# Patient Record
Sex: Female | Born: 1945
Health system: Southern US, Community
[De-identification: ages and names within clinical notes are randomized; demographics above are authoritative.]

## PROBLEM LIST (undated history)

## (undated) DIAGNOSIS — E039 Hypothyroidism, unspecified: Secondary | ICD-10-CM

## (undated) DIAGNOSIS — Z87442 Personal history of urinary calculi: Secondary | ICD-10-CM

## (undated) DIAGNOSIS — I10 Essential (primary) hypertension: Secondary | ICD-10-CM

## (undated) DIAGNOSIS — T8859XA Other complications of anesthesia, initial encounter: Secondary | ICD-10-CM

## (undated) DIAGNOSIS — G90519 Complex regional pain syndrome I of unspecified upper limb: Secondary | ICD-10-CM

## (undated) DIAGNOSIS — J189 Pneumonia, unspecified organism: Secondary | ICD-10-CM

## (undated) DIAGNOSIS — M1990 Unspecified osteoarthritis, unspecified site: Secondary | ICD-10-CM

## (undated) DIAGNOSIS — C4490 Unspecified malignant neoplasm of skin, unspecified: Secondary | ICD-10-CM

## (undated) DIAGNOSIS — T4145XA Adverse effect of unspecified anesthetic, initial encounter: Secondary | ICD-10-CM

## (undated) HISTORY — PX: APPENDECTOMY: SHX54

## (undated) HISTORY — PX: BREAST SURGERY: SHX581

---

## 2013-11-27 DIAGNOSIS — M5137 Other intervertebral disc degeneration, lumbosacral region: Secondary | ICD-10-CM | POA: Diagnosis not present

## 2013-11-27 DIAGNOSIS — I10 Essential (primary) hypertension: Secondary | ICD-10-CM | POA: Diagnosis not present

## 2013-11-27 DIAGNOSIS — M5430 Sciatica, unspecified side: Secondary | ICD-10-CM | POA: Diagnosis not present

## 2013-11-27 DIAGNOSIS — E039 Hypothyroidism, unspecified: Secondary | ICD-10-CM | POA: Diagnosis not present

## 2013-11-27 DIAGNOSIS — M545 Low back pain, unspecified: Secondary | ICD-10-CM | POA: Diagnosis not present

## 2013-12-26 DIAGNOSIS — M545 Low back pain, unspecified: Secondary | ICD-10-CM | POA: Diagnosis not present

## 2013-12-26 DIAGNOSIS — G90519 Complex regional pain syndrome I of unspecified upper limb: Secondary | ICD-10-CM | POA: Diagnosis not present

## 2013-12-26 DIAGNOSIS — J019 Acute sinusitis, unspecified: Secondary | ICD-10-CM | POA: Diagnosis not present

## 2013-12-26 DIAGNOSIS — I10 Essential (primary) hypertension: Secondary | ICD-10-CM | POA: Diagnosis not present

## 2013-12-26 DIAGNOSIS — M5137 Other intervertebral disc degeneration, lumbosacral region: Secondary | ICD-10-CM | POA: Diagnosis not present

## 2014-01-23 DIAGNOSIS — M5430 Sciatica, unspecified side: Secondary | ICD-10-CM | POA: Diagnosis not present

## 2014-01-23 DIAGNOSIS — M545 Low back pain, unspecified: Secondary | ICD-10-CM | POA: Diagnosis not present

## 2014-01-23 DIAGNOSIS — F4322 Adjustment disorder with anxiety: Secondary | ICD-10-CM | POA: Diagnosis not present

## 2014-01-23 DIAGNOSIS — I10 Essential (primary) hypertension: Secondary | ICD-10-CM | POA: Diagnosis not present

## 2014-01-23 DIAGNOSIS — M5137 Other intervertebral disc degeneration, lumbosacral region: Secondary | ICD-10-CM | POA: Diagnosis not present

## 2014-02-11 DIAGNOSIS — W010XXA Fall on same level from slipping, tripping and stumbling without subsequent striking against object, initial encounter: Secondary | ICD-10-CM | POA: Diagnosis not present

## 2014-02-11 DIAGNOSIS — S0003XA Contusion of scalp, initial encounter: Secondary | ICD-10-CM | POA: Diagnosis not present

## 2014-02-11 DIAGNOSIS — S2249XA Multiple fractures of ribs, unspecified side, initial encounter for closed fracture: Secondary | ICD-10-CM | POA: Diagnosis not present

## 2014-02-11 DIAGNOSIS — T148XXA Other injury of unspecified body region, initial encounter: Secondary | ICD-10-CM | POA: Diagnosis not present

## 2014-02-11 DIAGNOSIS — S0083XA Contusion of other part of head, initial encounter: Secondary | ICD-10-CM | POA: Diagnosis not present

## 2014-02-19 DIAGNOSIS — IMO0001 Reserved for inherently not codable concepts without codable children: Secondary | ICD-10-CM | POA: Diagnosis not present

## 2014-02-19 DIAGNOSIS — S2249XA Multiple fractures of ribs, unspecified side, initial encounter for closed fracture: Secondary | ICD-10-CM | POA: Diagnosis not present

## 2014-02-19 DIAGNOSIS — M549 Dorsalgia, unspecified: Secondary | ICD-10-CM | POA: Diagnosis not present

## 2014-02-19 DIAGNOSIS — W010XXA Fall on same level from slipping, tripping and stumbling without subsequent striking against object, initial encounter: Secondary | ICD-10-CM | POA: Diagnosis not present

## 2014-02-19 DIAGNOSIS — I10 Essential (primary) hypertension: Secondary | ICD-10-CM | POA: Diagnosis not present

## 2014-02-25 DIAGNOSIS — G90519 Complex regional pain syndrome I of unspecified upper limb: Secondary | ICD-10-CM | POA: Diagnosis not present

## 2014-02-25 DIAGNOSIS — M545 Low back pain, unspecified: Secondary | ICD-10-CM | POA: Diagnosis not present

## 2014-02-25 DIAGNOSIS — S2239XA Fracture of one rib, unspecified side, initial encounter for closed fracture: Secondary | ICD-10-CM | POA: Diagnosis not present

## 2014-02-25 DIAGNOSIS — F4322 Adjustment disorder with anxiety: Secondary | ICD-10-CM | POA: Diagnosis not present

## 2014-03-25 DIAGNOSIS — M545 Low back pain, unspecified: Secondary | ICD-10-CM | POA: Diagnosis not present

## 2014-03-25 DIAGNOSIS — G90519 Complex regional pain syndrome I of unspecified upper limb: Secondary | ICD-10-CM | POA: Diagnosis not present

## 2014-03-25 DIAGNOSIS — F4322 Adjustment disorder with anxiety: Secondary | ICD-10-CM | POA: Diagnosis not present

## 2014-03-25 DIAGNOSIS — I10 Essential (primary) hypertension: Secondary | ICD-10-CM | POA: Diagnosis not present

## 2014-03-25 DIAGNOSIS — S2249XA Multiple fractures of ribs, unspecified side, initial encounter for closed fracture: Secondary | ICD-10-CM | POA: Diagnosis not present

## 2014-03-25 DIAGNOSIS — M5137 Other intervertebral disc degeneration, lumbosacral region: Secondary | ICD-10-CM | POA: Diagnosis not present

## 2014-03-25 DIAGNOSIS — M5430 Sciatica, unspecified side: Secondary | ICD-10-CM | POA: Diagnosis not present

## 2014-04-22 DIAGNOSIS — I10 Essential (primary) hypertension: Secondary | ICD-10-CM | POA: Diagnosis not present

## 2014-04-22 DIAGNOSIS — M5137 Other intervertebral disc degeneration, lumbosacral region: Secondary | ICD-10-CM | POA: Diagnosis not present

## 2014-04-22 DIAGNOSIS — M545 Low back pain, unspecified: Secondary | ICD-10-CM | POA: Diagnosis not present

## 2014-04-22 DIAGNOSIS — F4322 Adjustment disorder with anxiety: Secondary | ICD-10-CM | POA: Diagnosis not present

## 2014-04-22 DIAGNOSIS — S2239XA Fracture of one rib, unspecified side, initial encounter for closed fracture: Secondary | ICD-10-CM | POA: Diagnosis not present

## 2014-05-15 DIAGNOSIS — H60399 Other infective otitis externa, unspecified ear: Secondary | ICD-10-CM | POA: Diagnosis not present

## 2014-05-21 DIAGNOSIS — G9050 Complex regional pain syndrome I, unspecified: Secondary | ICD-10-CM | POA: Diagnosis not present

## 2014-05-21 DIAGNOSIS — K219 Gastro-esophageal reflux disease without esophagitis: Secondary | ICD-10-CM | POA: Diagnosis not present

## 2014-05-21 DIAGNOSIS — M545 Low back pain, unspecified: Secondary | ICD-10-CM | POA: Diagnosis not present

## 2014-05-21 DIAGNOSIS — M25559 Pain in unspecified hip: Secondary | ICD-10-CM | POA: Diagnosis not present

## 2014-05-21 DIAGNOSIS — E039 Hypothyroidism, unspecified: Secondary | ICD-10-CM | POA: Diagnosis not present

## 2014-05-21 DIAGNOSIS — M5430 Sciatica, unspecified side: Secondary | ICD-10-CM | POA: Diagnosis not present

## 2014-05-21 DIAGNOSIS — I10 Essential (primary) hypertension: Secondary | ICD-10-CM | POA: Diagnosis not present

## 2014-05-21 DIAGNOSIS — S2239XA Fracture of one rib, unspecified side, initial encounter for closed fracture: Secondary | ICD-10-CM | POA: Diagnosis not present

## 2014-06-23 DIAGNOSIS — S2239XA Fracture of one rib, unspecified side, initial encounter for closed fracture: Secondary | ICD-10-CM | POA: Diagnosis not present

## 2014-06-23 DIAGNOSIS — I10 Essential (primary) hypertension: Secondary | ICD-10-CM | POA: Diagnosis not present

## 2014-06-23 DIAGNOSIS — M5430 Sciatica, unspecified side: Secondary | ICD-10-CM | POA: Diagnosis not present

## 2014-06-23 DIAGNOSIS — G90519 Complex regional pain syndrome I of unspecified upper limb: Secondary | ICD-10-CM | POA: Diagnosis not present

## 2014-06-23 DIAGNOSIS — M545 Low back pain, unspecified: Secondary | ICD-10-CM | POA: Diagnosis not present

## 2014-07-29 DIAGNOSIS — N390 Urinary tract infection, site not specified: Secondary | ICD-10-CM | POA: Diagnosis not present

## 2014-08-18 DIAGNOSIS — M5430 Sciatica, unspecified side: Secondary | ICD-10-CM | POA: Diagnosis not present

## 2014-08-18 DIAGNOSIS — I10 Essential (primary) hypertension: Secondary | ICD-10-CM | POA: Diagnosis not present

## 2014-08-18 DIAGNOSIS — Z79891 Long term (current) use of opiate analgesic: Secondary | ICD-10-CM | POA: Diagnosis not present

## 2014-08-18 DIAGNOSIS — G90519 Complex regional pain syndrome I of unspecified upper limb: Secondary | ICD-10-CM | POA: Diagnosis not present

## 2014-08-18 DIAGNOSIS — M5136 Other intervertebral disc degeneration, lumbar region: Secondary | ICD-10-CM | POA: Diagnosis not present

## 2014-08-18 DIAGNOSIS — F411 Generalized anxiety disorder: Secondary | ICD-10-CM | POA: Diagnosis not present

## 2014-08-18 DIAGNOSIS — D649 Anemia, unspecified: Secondary | ICD-10-CM | POA: Diagnosis not present

## 2014-08-18 DIAGNOSIS — M545 Low back pain: Secondary | ICD-10-CM | POA: Diagnosis not present

## 2014-08-18 DIAGNOSIS — E039 Hypothyroidism, unspecified: Secondary | ICD-10-CM | POA: Diagnosis not present

## 2014-09-17 DIAGNOSIS — E039 Hypothyroidism, unspecified: Secondary | ICD-10-CM | POA: Diagnosis not present

## 2014-09-17 DIAGNOSIS — M5430 Sciatica, unspecified side: Secondary | ICD-10-CM | POA: Diagnosis not present

## 2014-09-17 DIAGNOSIS — Z23 Encounter for immunization: Secondary | ICD-10-CM | POA: Diagnosis not present

## 2014-09-17 DIAGNOSIS — M5136 Other intervertebral disc degeneration, lumbar region: Secondary | ICD-10-CM | POA: Diagnosis not present

## 2014-09-17 DIAGNOSIS — M545 Low back pain: Secondary | ICD-10-CM | POA: Diagnosis not present

## 2014-09-17 DIAGNOSIS — G4700 Insomnia, unspecified: Secondary | ICD-10-CM | POA: Diagnosis not present

## 2014-09-17 DIAGNOSIS — I10 Essential (primary) hypertension: Secondary | ICD-10-CM | POA: Diagnosis not present

## 2014-09-17 DIAGNOSIS — G90511 Complex regional pain syndrome I of right upper limb: Secondary | ICD-10-CM | POA: Diagnosis not present

## 2014-12-24 DIAGNOSIS — E039 Hypothyroidism, unspecified: Secondary | ICD-10-CM | POA: Diagnosis not present

## 2014-12-24 DIAGNOSIS — K219 Gastro-esophageal reflux disease without esophagitis: Secondary | ICD-10-CM | POA: Diagnosis not present

## 2014-12-24 DIAGNOSIS — G8929 Other chronic pain: Secondary | ICD-10-CM | POA: Diagnosis not present

## 2014-12-24 DIAGNOSIS — I10 Essential (primary) hypertension: Secondary | ICD-10-CM | POA: Diagnosis not present

## 2014-12-24 DIAGNOSIS — G4700 Insomnia, unspecified: Secondary | ICD-10-CM | POA: Diagnosis not present

## 2015-01-21 DIAGNOSIS — R413 Other amnesia: Secondary | ICD-10-CM | POA: Diagnosis not present

## 2015-01-21 DIAGNOSIS — Z7289 Other problems related to lifestyle: Secondary | ICD-10-CM | POA: Diagnosis not present

## 2015-01-21 DIAGNOSIS — Z114 Encounter for screening for human immunodeficiency virus [HIV]: Secondary | ICD-10-CM | POA: Diagnosis not present

## 2015-01-21 DIAGNOSIS — Z0000 Encounter for general adult medical examination without abnormal findings: Secondary | ICD-10-CM | POA: Diagnosis not present

## 2015-01-21 DIAGNOSIS — E039 Hypothyroidism, unspecified: Secondary | ICD-10-CM | POA: Diagnosis not present

## 2015-01-21 DIAGNOSIS — M5430 Sciatica, unspecified side: Secondary | ICD-10-CM | POA: Diagnosis not present

## 2015-01-21 DIAGNOSIS — E538 Deficiency of other specified B group vitamins: Secondary | ICD-10-CM | POA: Diagnosis not present

## 2015-01-21 DIAGNOSIS — M1990 Unspecified osteoarthritis, unspecified site: Secondary | ICD-10-CM | POA: Diagnosis not present

## 2015-01-21 DIAGNOSIS — Z23 Encounter for immunization: Secondary | ICD-10-CM | POA: Diagnosis not present

## 2015-01-21 DIAGNOSIS — R5383 Other fatigue: Secondary | ICD-10-CM | POA: Diagnosis not present

## 2015-01-21 DIAGNOSIS — E785 Hyperlipidemia, unspecified: Secondary | ICD-10-CM | POA: Diagnosis not present

## 2015-01-21 DIAGNOSIS — E559 Vitamin D deficiency, unspecified: Secondary | ICD-10-CM | POA: Diagnosis not present

## 2015-04-24 DIAGNOSIS — G4700 Insomnia, unspecified: Secondary | ICD-10-CM | POA: Diagnosis not present

## 2015-04-24 DIAGNOSIS — M5430 Sciatica, unspecified side: Secondary | ICD-10-CM | POA: Diagnosis not present

## 2015-04-24 DIAGNOSIS — G8929 Other chronic pain: Secondary | ICD-10-CM | POA: Diagnosis not present

## 2015-04-24 DIAGNOSIS — E559 Vitamin D deficiency, unspecified: Secondary | ICD-10-CM | POA: Diagnosis not present

## 2015-04-24 DIAGNOSIS — E039 Hypothyroidism, unspecified: Secondary | ICD-10-CM | POA: Diagnosis not present

## 2015-04-24 DIAGNOSIS — Z5181 Encounter for therapeutic drug level monitoring: Secondary | ICD-10-CM | POA: Diagnosis not present

## 2015-04-28 ENCOUNTER — Other Ambulatory Visit: Payer: Self-pay | Admitting: Family Medicine

## 2015-04-28 DIAGNOSIS — M5430 Sciatica, unspecified side: Secondary | ICD-10-CM

## 2015-05-13 ENCOUNTER — Inpatient Hospital Stay: Admission: RE | Admit: 2015-05-13 | Payer: Self-pay | Source: Ambulatory Visit

## 2015-05-25 ENCOUNTER — Ambulatory Visit
Admission: RE | Admit: 2015-05-25 | Discharge: 2015-05-25 | Disposition: A | Discharge status: Home / Self Care | Payer: Medicaid Other | Source: Ambulatory Visit | Attending: Family Medicine | Admitting: Family Medicine

## 2015-05-25 DIAGNOSIS — M4806 Spinal stenosis, lumbar region: Secondary | ICD-10-CM | POA: Diagnosis not present

## 2015-05-25 DIAGNOSIS — M4316 Spondylolisthesis, lumbar region: Secondary | ICD-10-CM | POA: Diagnosis not present

## 2015-05-25 DIAGNOSIS — M5430 Sciatica, unspecified side: Secondary | ICD-10-CM

## 2015-05-25 DIAGNOSIS — M5126 Other intervertebral disc displacement, lumbar region: Secondary | ICD-10-CM | POA: Diagnosis not present

## 2015-05-25 DIAGNOSIS — M4727 Other spondylosis with radiculopathy, lumbosacral region: Secondary | ICD-10-CM | POA: Diagnosis not present

## 2015-07-24 DIAGNOSIS — G8929 Other chronic pain: Secondary | ICD-10-CM | POA: Diagnosis not present

## 2015-07-24 DIAGNOSIS — R0602 Shortness of breath: Secondary | ICD-10-CM | POA: Diagnosis not present

## 2015-07-24 DIAGNOSIS — N898 Other specified noninflammatory disorders of vagina: Secondary | ICD-10-CM | POA: Diagnosis not present

## 2015-07-24 DIAGNOSIS — R109 Unspecified abdominal pain: Secondary | ICD-10-CM | POA: Diagnosis not present

## 2015-07-24 DIAGNOSIS — E039 Hypothyroidism, unspecified: Secondary | ICD-10-CM | POA: Diagnosis not present

## 2015-07-24 DIAGNOSIS — I10 Essential (primary) hypertension: Secondary | ICD-10-CM | POA: Diagnosis not present

## 2015-07-24 DIAGNOSIS — M5430 Sciatica, unspecified side: Secondary | ICD-10-CM | POA: Diagnosis not present

## 2015-07-24 DIAGNOSIS — K5900 Constipation, unspecified: Secondary | ICD-10-CM | POA: Diagnosis not present

## 2015-07-27 ENCOUNTER — Other Ambulatory Visit: Payer: Self-pay | Admitting: Family Medicine

## 2015-07-27 DIAGNOSIS — R1084 Generalized abdominal pain: Secondary | ICD-10-CM

## 2015-07-31 ENCOUNTER — Ambulatory Visit
Admission: RE | Admit: 2015-07-31 | Discharge: 2015-07-31 | Disposition: A | Discharge status: Home / Self Care | Payer: Medicaid Other | Source: Ambulatory Visit | Attending: Family Medicine | Admitting: Family Medicine

## 2015-07-31 DIAGNOSIS — R1084 Generalized abdominal pain: Secondary | ICD-10-CM

## 2015-07-31 DIAGNOSIS — R109 Unspecified abdominal pain: Secondary | ICD-10-CM | POA: Diagnosis not present

## 2015-07-31 DIAGNOSIS — R7989 Other specified abnormal findings of blood chemistry: Secondary | ICD-10-CM | POA: Diagnosis not present

## 2015-07-31 DIAGNOSIS — I10 Essential (primary) hypertension: Secondary | ICD-10-CM | POA: Diagnosis not present

## 2015-08-05 ENCOUNTER — Other Ambulatory Visit: Payer: Medicaid Other

## 2015-08-24 DIAGNOSIS — Z23 Encounter for immunization: Secondary | ICD-10-CM | POA: Diagnosis not present

## 2015-10-23 DIAGNOSIS — M5430 Sciatica, unspecified side: Secondary | ICD-10-CM | POA: Diagnosis not present

## 2016-01-20 DIAGNOSIS — E785 Hyperlipidemia, unspecified: Secondary | ICD-10-CM | POA: Diagnosis not present

## 2016-01-20 DIAGNOSIS — I10 Essential (primary) hypertension: Secondary | ICD-10-CM | POA: Diagnosis not present

## 2016-01-20 DIAGNOSIS — Z0000 Encounter for general adult medical examination without abnormal findings: Secondary | ICD-10-CM | POA: Diagnosis not present

## 2016-01-20 DIAGNOSIS — E611 Iron deficiency: Secondary | ICD-10-CM | POA: Diagnosis not present

## 2016-01-20 DIAGNOSIS — G8929 Other chronic pain: Secondary | ICD-10-CM | POA: Diagnosis not present

## 2016-01-20 DIAGNOSIS — E039 Hypothyroidism, unspecified: Secondary | ICD-10-CM | POA: Diagnosis not present

## 2016-01-20 DIAGNOSIS — Z1211 Encounter for screening for malignant neoplasm of colon: Secondary | ICD-10-CM | POA: Diagnosis not present

## 2016-02-24 DIAGNOSIS — Z1211 Encounter for screening for malignant neoplasm of colon: Secondary | ICD-10-CM | POA: Diagnosis not present

## 2016-02-29 DIAGNOSIS — M810 Age-related osteoporosis without current pathological fracture: Secondary | ICD-10-CM | POA: Diagnosis not present

## 2016-07-22 DIAGNOSIS — M5432 Sciatica, left side: Secondary | ICD-10-CM | POA: Diagnosis not present

## 2016-07-22 DIAGNOSIS — E039 Hypothyroidism, unspecified: Secondary | ICD-10-CM | POA: Diagnosis not present

## 2016-07-22 DIAGNOSIS — G8929 Other chronic pain: Secondary | ICD-10-CM | POA: Diagnosis not present

## 2016-07-22 DIAGNOSIS — Z23 Encounter for immunization: Secondary | ICD-10-CM | POA: Diagnosis not present

## 2016-07-22 DIAGNOSIS — M25511 Pain in right shoulder: Secondary | ICD-10-CM | POA: Diagnosis not present

## 2016-07-22 DIAGNOSIS — I10 Essential (primary) hypertension: Secondary | ICD-10-CM | POA: Diagnosis not present

## 2016-08-05 DIAGNOSIS — M4806 Spinal stenosis, lumbar region: Secondary | ICD-10-CM | POA: Diagnosis not present

## 2016-08-05 DIAGNOSIS — M25511 Pain in right shoulder: Secondary | ICD-10-CM | POA: Diagnosis not present

## 2016-08-23 DIAGNOSIS — M48061 Spinal stenosis, lumbar region without neurogenic claudication: Secondary | ICD-10-CM | POA: Diagnosis not present

## 2016-09-19 DIAGNOSIS — M48061 Spinal stenosis, lumbar region without neurogenic claudication: Secondary | ICD-10-CM | POA: Diagnosis not present

## 2016-09-19 DIAGNOSIS — M25511 Pain in right shoulder: Secondary | ICD-10-CM | POA: Diagnosis not present

## 2016-10-25 DIAGNOSIS — M5416 Radiculopathy, lumbar region: Secondary | ICD-10-CM | POA: Diagnosis not present

## 2016-10-25 DIAGNOSIS — M25511 Pain in right shoulder: Secondary | ICD-10-CM | POA: Diagnosis not present

## 2017-02-09 DIAGNOSIS — M48061 Spinal stenosis, lumbar region without neurogenic claudication: Secondary | ICD-10-CM | POA: Diagnosis not present

## 2017-02-15 ENCOUNTER — Encounter (HOSPITAL_COMMUNITY): Payer: Self-pay | Admitting: Emergency Medicine

## 2017-02-15 ENCOUNTER — Emergency Department (HOSPITAL_COMMUNITY): Payer: Medicare Other

## 2017-02-15 ENCOUNTER — Emergency Department (HOSPITAL_COMMUNITY)
Admission: EM | Admit: 2017-02-15 | Discharge: 2017-02-15 | Disposition: A | Discharge status: Home / Self Care | Payer: Medicare Other | Attending: Emergency Medicine | Admitting: Emergency Medicine

## 2017-02-15 DIAGNOSIS — S0990XA Unspecified injury of head, initial encounter: Secondary | ICD-10-CM | POA: Diagnosis not present

## 2017-02-15 DIAGNOSIS — R220 Localized swelling, mass and lump, head: Secondary | ICD-10-CM | POA: Diagnosis not present

## 2017-02-15 DIAGNOSIS — W19XXXA Unspecified fall, initial encounter: Secondary | ICD-10-CM

## 2017-02-15 DIAGNOSIS — W01198A Fall on same level from slipping, tripping and stumbling with subsequent striking against other object, initial encounter: Secondary | ICD-10-CM | POA: Insufficient documentation

## 2017-02-15 DIAGNOSIS — R51 Headache: Secondary | ICD-10-CM | POA: Diagnosis not present

## 2017-02-15 DIAGNOSIS — Z23 Encounter for immunization: Secondary | ICD-10-CM | POA: Insufficient documentation

## 2017-02-15 DIAGNOSIS — Y999 Unspecified external cause status: Secondary | ICD-10-CM | POA: Diagnosis not present

## 2017-02-15 DIAGNOSIS — S0181XA Laceration without foreign body of other part of head, initial encounter: Secondary | ICD-10-CM | POA: Diagnosis not present

## 2017-02-15 DIAGNOSIS — S0180XA Unspecified open wound of other part of head, initial encounter: Secondary | ICD-10-CM | POA: Diagnosis not present

## 2017-02-15 DIAGNOSIS — Y92039 Unspecified place in apartment as the place of occurrence of the external cause: Secondary | ICD-10-CM | POA: Diagnosis not present

## 2017-02-15 DIAGNOSIS — Y939 Activity, unspecified: Secondary | ICD-10-CM | POA: Diagnosis not present

## 2017-02-15 MED ORDER — TETANUS-DIPHTH-ACELL PERTUSSIS 5-2.5-18.5 LF-MCG/0.5 IM SUSP
0.5000 mL | Freq: Once | INTRAMUSCULAR | Status: AC
  Administered 2017-02-15: 0.5 mL via INTRAMUSCULAR
  Filled 2017-02-15: qty 0.5

## 2017-02-15 MED ORDER — TETANUS-DIPHTH-ACELL PERTUSSIS 5-2.5-18.5 LF-MCG/0.5 IM SUSP: 0.5000 mL | Freq: Once | INTRAMUSCULAR | Status: DC

## 2017-02-15 MED ORDER — LIDOCAINE-EPINEPHRINE (PF) 2 %-1:200000 IJ SOLN
10.0000 mL | Freq: Once | INTRAMUSCULAR | Status: AC
  Administered 2017-02-15: 10 mL
  Filled 2017-02-15: qty 20

## 2017-02-15 NOTE — Discharge Instructions (Addendum)
Sutures out in 7 days- may wait for your appointment on April 13  Sterile Tape Wound Care Some cuts and wounds can be closed using sterile tape, also called skin adhesive strips. Skin adhesive strips can be used for shallow (superficial) and simple cuts, wounds, lacerations, and some surgical incisions. These strips act in place of stitches, or in addition to stitches, to hold the edges of the wound together to allow for better healing. Unlike stitches, the adhesive strips do not require needles or anesthetic medicine for placement. The strips usually fall off on their own as the wound is healing. It is important to take proper care of your wound at home while it heals. How to care for a sterile tape wound Try to keep the area around your wound clean and dry. Do not allow the adhesive strips to get wet for the first 12 hours. Do not use any soaps or ointments on the wound for the first 12 hours. If a bandage (dressing) has been applied, keep it dry. Follow instructions from your health care provider about how often to change the dressing. Wash your hands with soap and water before you change your dressing. If soap and water are not available, use hand sanitizer. Change your dressing as told by your health care provider. Leave adhesive strips in place. These skin closures may need to stay in place for 2 weeks or longer. If adhesive strip edges start to loosen and curl up, you may trim the loose edges. Do not remove adhesive strips completely unless your health care provider tells you to do that. Do not scratch, rub, or pick at the wound area. Protect the wound from further injury until it is healed. Protect the wound from sun and tanning bed exposure while it is healing, and for several weeks after healing. Check the wound every day for signs of infection. Check for: More redness, swelling, or pain. More fluid or blood. Warmth. Pus or a bad smell. Follow these instructions at home: Take  over-the-counter and prescription medicines only as told by your health care provider. Keep all follow-up visits as told by your health care provider. This is important. Contact a health care provider if: Your adhesive strips become soaked with blood or fall off before the wound has healed. The tape will need to be replaced. You have a fever. Get help right away if: You have chills. You develop a rash after the strips are applied. You have a red streak that goes away from the wound. You have more redness, swelling, or pain around your wound. You have more fluid or blood coming from your wound. Your wound feels warm to the touch. You have pus or a bad smell coming from your wound. Your wound breaks open. This information is not intended to replace advice given to you by your health care provider. Make sure you discuss any questions you have with your health care provider. Document Released: 12/08/2004 Document Revised: 09/23/2016 Document Reviewed: 09/23/2016 Elsevier Interactive Patient Education  2017 [employer].

## 2017-02-15 NOTE — ED Notes (Signed)
Applied Bacitracin ointment topically on sutures and nasal lac, per MD.

## 2017-02-15 NOTE — ED Notes (Signed)
Transported to ct 

## 2017-02-15 NOTE — ED Triage Notes (Signed)
Pt states she was carrying a pack of water, thinks she tripped on a rug, fell and struck her face on the refrigerator, laceration to bridge of nose and right forehead. Not on blood thinners, no LOC. Pt a/ox4.

## 2017-02-15 NOTE — ED Provider Notes (Signed)
[facility] DEPT Provider Note   CSN: [id number] Arrival date & time: 02/15/17  1646     History   Chief Complaint Chief Complaint  Patient presents with  . Fall    HPI Marie Burns is a 71 y.o. female.  HPI  71 year old female history of hypertension presents today after trip and fall in her apartment. States she tripped over the edge of the rug. She struck her face. She has a laceration to the right forehead and over the bridge of the nose. She did not lose consciousness. She is complaining of some headache. She states that she has right shoulder pain that has been chronic and has had an MRI for this. She denies any other new injuries. She denies neck pain or new back pain. She is not having any numbness, tingling, or weakness. She denies any problems with her bite  History reviewed. No pertinent past medical history.  There are no active problems to display for this patient.   Past Surgical History:  Procedure Laterality Date  . APPENDECTOMY      OB History    No data available       Home Medications    Prior to Admission medications   Not on File    Family History No family history on file.  Social History Social History  Substance Use Topics  . Smoking status: Never Smoker  . Smokeless tobacco: Not on file  . Alcohol use No     Allergies   Gabapentin   Review of Systems Review of Systems  All other systems reviewed and are negative.    Physical Exam Updated Vital Signs BP (!) 172/80 (BP Location: Left Arm)   Pulse 72   Temp 98.7 F (37.1 C) (Oral)   Resp 18   Ht 4\' 11"  (1.499 m)   Wt 66.2 kg   SpO2 100%   BMI 29.49 kg/m   Physical Exam  Constitutional: She is oriented to person, place, and time. She appears well-developed and well-nourished. No distress.  HENT:  Head: Normocephalic and atraumatic.    Right Ear: External ear normal.  Left Ear: External ear normal.  Nose: Nose normal.  No bony tenderness around orbits,  nose, or cheeks. No septal hematoma noted. No nasal bleeding noted. 2.5 cm crescent-shaped laceration through and through skin on forehead 2 cm laceration bridge of nose through skin  Eyes: Conjunctivae and EOM are normal. Pupils are equal, round, and reactive to light.  Neck: Normal range of motion. Neck supple.  Cardiovascular: Normal rate.   Pulmonary/Chest: Effort normal and breath sounds normal.  Abdominal: Soft.  Musculoskeletal: Normal range of motion.  Cervical, thoracic, and lumbar spine are nontender to palpation. No signs of contusion or trauma to bilateral upper extremities or lower extremities. Patient is bearing weight without difficulty.  Neurological: She is alert and oriented to person, place, and time. She exhibits normal muscle tone. Coordination normal.  Skin: Skin is warm and dry. Capillary refill takes less than 2 seconds.  Psychiatric: She has a normal mood and affect. Her behavior is normal. Thought content normal.  Nursing note and vitals reviewed.    ED Treatments / Results  Labs (all labs ordered are listed, but only abnormal results are displayed) Labs Reviewed - No data to display  EKG  EKG Interpretation None       Radiology No results found.  Procedures .Marland KitchenLaceration Repair Date/Time: 02/15/2017 8:20 PM Performed by: Pattricia Boss Authorized by: Pattricia Boss   Consent:  Consent obtained:  Verbal   Consent given by:  Patient   Risks discussed:  Infection and pain   Alternatives discussed:  No treatment Anesthesia (see MAR for exact dosages):    Anesthesia method:  Local infiltration   Local anesthetic:  Lidocaine 1% w/o epi Laceration details:    Location:  Face   Face location:  Forehead   Length (cm):  2.5 Repair type:    Repair type:  Simple Pre-procedure details:    Preparation:  Patient was prepped and draped in usual sterile fashion Exploration:    Wound exploration: wound explored through full range of motion      Contaminated: no   Treatment:    Area cleansed with:  Hibiclens   Amount of cleaning:  Standard   Irrigation solution:  Sterile saline   Visualized foreign bodies/material removed: no   Skin repair:    Repair method:  Sutures   Suture size:  5-0   Suture material:  Prolene   Suture technique:  Simple interrupted Approximation:    Approximation:  Close   Vermilion border: well-aligned   Post-procedure details:    Dressing:  Antibiotic ointment   Patient tolerance of procedure:  Tolerated well, no immediate complications   (including critical care time)  Medications Ordered in ED Medications - No data to display   Initial Impression / Assessment and Plan / ED Course  I have reviewed the triage vital signs and the nursing notes.  Pertinent labs & imaging results that were available during my care of the patient were reviewed by me and considered in my medical decision making (see chart for details).     Wound explored and no evidence of foreign body or contamination. Cleaned with saline. Patient refuses sutures.  7:18 PM Patient told nurse that she would have stitches  Patient allowed sutures to forehead but states that she only wants Steri-Strips on nose.  Final Clinical Impressions(s) / ED Diagnoses   Final diagnoses:  Fall, initial encounter  Facial laceration, initial encounter    New Prescriptions New Prescriptions   No medications on file     Pattricia Boss, MD 02/15/17 2024

## 2017-02-17 DIAGNOSIS — M25511 Pain in right shoulder: Secondary | ICD-10-CM | POA: Diagnosis not present

## 2017-02-23 DIAGNOSIS — M5416 Radiculopathy, lumbar region: Secondary | ICD-10-CM | POA: Diagnosis not present

## 2017-02-23 DIAGNOSIS — M25511 Pain in right shoulder: Secondary | ICD-10-CM | POA: Diagnosis not present

## 2017-02-28 DIAGNOSIS — I10 Essential (primary) hypertension: Secondary | ICD-10-CM | POA: Diagnosis not present

## 2017-02-28 DIAGNOSIS — Z1211 Encounter for screening for malignant neoplasm of colon: Secondary | ICD-10-CM | POA: Diagnosis not present

## 2017-02-28 DIAGNOSIS — E559 Vitamin D deficiency, unspecified: Secondary | ICD-10-CM | POA: Diagnosis not present

## 2017-02-28 DIAGNOSIS — R413 Other amnesia: Secondary | ICD-10-CM | POA: Diagnosis not present

## 2017-02-28 DIAGNOSIS — E039 Hypothyroidism, unspecified: Secondary | ICD-10-CM | POA: Diagnosis not present

## 2017-02-28 DIAGNOSIS — M48061 Spinal stenosis, lumbar region without neurogenic claudication: Secondary | ICD-10-CM | POA: Diagnosis not present

## 2017-02-28 DIAGNOSIS — E785 Hyperlipidemia, unspecified: Secondary | ICD-10-CM | POA: Diagnosis not present

## 2017-02-28 DIAGNOSIS — M810 Age-related osteoporosis without current pathological fracture: Secondary | ICD-10-CM | POA: Diagnosis not present

## 2017-02-28 DIAGNOSIS — Z0000 Encounter for general adult medical examination without abnormal findings: Secondary | ICD-10-CM | POA: Diagnosis not present

## 2017-02-28 DIAGNOSIS — E538 Deficiency of other specified B group vitamins: Secondary | ICD-10-CM | POA: Diagnosis not present

## 2017-02-28 DIAGNOSIS — G8929 Other chronic pain: Secondary | ICD-10-CM | POA: Diagnosis not present

## 2017-03-02 ENCOUNTER — Other Ambulatory Visit: Payer: Self-pay | Admitting: Family Medicine

## 2017-03-02 DIAGNOSIS — R1012 Left upper quadrant pain: Secondary | ICD-10-CM

## 2017-03-14 DIAGNOSIS — M5416 Radiculopathy, lumbar region: Secondary | ICD-10-CM | POA: Diagnosis not present

## 2017-03-17 DIAGNOSIS — Z1211 Encounter for screening for malignant neoplasm of colon: Secondary | ICD-10-CM | POA: Diagnosis not present

## 2017-03-20 ENCOUNTER — Other Ambulatory Visit: Payer: Medicare Other

## 2017-03-22 DIAGNOSIS — M19211 Secondary osteoarthritis, right shoulder: Secondary | ICD-10-CM | POA: Diagnosis not present

## 2017-03-24 ENCOUNTER — Ambulatory Visit
Admission: RE | Admit: 2017-03-24 | Discharge: 2017-03-24 | Disposition: A | Discharge status: Home / Self Care | Payer: Medicare Other | Source: Ambulatory Visit | Attending: Family Medicine | Admitting: Family Medicine

## 2017-03-24 DIAGNOSIS — R1012 Left upper quadrant pain: Secondary | ICD-10-CM | POA: Diagnosis not present

## 2017-03-25 DIAGNOSIS — N390 Urinary tract infection, site not specified: Secondary | ICD-10-CM | POA: Diagnosis not present

## 2017-04-13 ENCOUNTER — Other Ambulatory Visit: Payer: Self-pay | Admitting: Orthopedic Surgery

## 2017-05-16 ENCOUNTER — Other Ambulatory Visit: Payer: Self-pay | Admitting: Obstetrics & Gynecology

## 2017-05-19 ENCOUNTER — Encounter (HOSPITAL_COMMUNITY): Payer: Self-pay

## 2017-05-19 ENCOUNTER — Encounter (HOSPITAL_COMMUNITY)
Admission: RE | Admit: 2017-05-19 | Discharge: 2017-05-19 | Disposition: A | Discharge status: Home / Self Care | Payer: Medicare Other | Source: Ambulatory Visit | Attending: Orthopedic Surgery | Admitting: Orthopedic Surgery

## 2017-05-19 ENCOUNTER — Ambulatory Visit (HOSPITAL_COMMUNITY)
Admission: RE | Admit: 2017-05-19 | Discharge: 2017-05-19 | Disposition: A | Discharge status: Home / Self Care | Payer: Medicare Other | Source: Ambulatory Visit | Attending: Orthopedic Surgery | Admitting: Orthopedic Surgery

## 2017-05-19 DIAGNOSIS — Z01812 Encounter for preprocedural laboratory examination: Secondary | ICD-10-CM | POA: Diagnosis not present

## 2017-05-19 DIAGNOSIS — M12811 Other specific arthropathies, not elsewhere classified, right shoulder: Secondary | ICD-10-CM | POA: Diagnosis not present

## 2017-05-19 DIAGNOSIS — I700 Atherosclerosis of aorta: Secondary | ICD-10-CM | POA: Insufficient documentation

## 2017-05-19 DIAGNOSIS — Z01818 Encounter for other preprocedural examination: Secondary | ICD-10-CM

## 2017-05-19 DIAGNOSIS — M75101 Unspecified rotator cuff tear or rupture of right shoulder, not specified as traumatic: Secondary | ICD-10-CM | POA: Insufficient documentation

## 2017-05-19 DIAGNOSIS — R918 Other nonspecific abnormal finding of lung field: Secondary | ICD-10-CM | POA: Diagnosis not present

## 2017-05-19 HISTORY — DX: Unspecified malignant neoplasm of skin, unspecified: C44.90

## 2017-05-19 HISTORY — DX: Hypothyroidism, unspecified: E03.9

## 2017-05-19 HISTORY — DX: Adverse effect of unspecified anesthetic, initial encounter: T41.45XA

## 2017-05-19 HISTORY — DX: Unspecified osteoarthritis, unspecified site: M19.90

## 2017-05-19 HISTORY — DX: Pneumonia, unspecified organism: J18.9

## 2017-05-19 HISTORY — DX: Other complications of anesthesia, initial encounter: T88.59XA

## 2017-05-19 HISTORY — DX: Complex regional pain syndrome I of unspecified upper limb: G90.519

## 2017-05-19 HISTORY — DX: Essential (primary) hypertension: I10

## 2017-05-19 HISTORY — DX: Personal history of urinary calculi: Z87.442

## 2017-05-19 LAB — CBC WITH DIFFERENTIAL/PLATELET
Basophils Absolute: 0 10*3/uL (ref 0.0–0.1)
Basophils Relative: 0 %
Eosinophils Absolute: 0.1 10*3/uL (ref 0.0–0.7)
Eosinophils Relative: 1 %
HCT: 41.2 % (ref 36.0–46.0)
Hemoglobin: 13.2 g/dL (ref 12.0–15.0)
Lymphocytes Relative: 25 %
Lymphs Abs: 2.2 10*3/uL (ref 0.7–4.0)
MCH: 28.8 pg (ref 26.0–34.0)
MCHC: 32 g/dL (ref 30.0–36.0)
MCV: 89.8 fL (ref 78.0–100.0)
Monocytes Absolute: 0.3 10*3/uL (ref 0.1–1.0)
Monocytes Relative: 3 %
Neutro Abs: 6.2 10*3/uL (ref 1.7–7.7)
Neutrophils Relative %: 71 %
Platelets: 280 10*3/uL (ref 150–400)
RBC: 4.59 MIL/uL (ref 3.87–5.11)
RDW: 13.5 % (ref 11.5–15.5)
WBC: 8.8 10*3/uL (ref 4.0–10.5)

## 2017-05-19 LAB — PROTIME-INR
INR: 0.92
Prothrombin Time: 12.4 seconds (ref 11.4–15.2)

## 2017-05-19 LAB — COMPREHENSIVE METABOLIC PANEL
ALT: 15 U/L (ref 14–54)
AST: 23 U/L (ref 15–41)
Albumin: 3.8 g/dL (ref 3.5–5.0)
Alkaline Phosphatase: 57 U/L (ref 38–126)
Anion gap: 7 (ref 5–15)
BUN: 19 mg/dL (ref 6–20)
CO2: 26 mmol/L (ref 22–32)
Calcium: 9 mg/dL (ref 8.9–10.3)
Chloride: 102 mmol/L (ref 101–111)
Creatinine, Ser: 0.79 mg/dL (ref 0.44–1.00)
GFR calc Af Amer: >60 mL/min (ref >60)
GFR calc non Af Amer: >60 mL/min (ref >60)
Glucose, Bld: 100 mg/dL — ABNORMAL HIGH (ref 65–99)
Potassium: 4 mmol/L (ref 3.5–5.1)
Sodium: 135 mmol/L (ref 135–145)
Total Bilirubin: 0.3 mg/dL (ref 0.3–1.2)
Total Protein: 6.9 g/dL (ref 6.5–8.1)

## 2017-05-19 LAB — URINALYSIS, ROUTINE W REFLEX MICROSCOPIC
Bacteria, UA: NONE SEEN
Bilirubin Urine: NEGATIVE
Glucose, UA: NEGATIVE mg/dL
Hgb urine dipstick: NEGATIVE
Ketones, ur: NEGATIVE mg/dL
Nitrite: NEGATIVE
Protein, ur: NEGATIVE mg/dL
Specific Gravity, Urine: 1.005 (ref 1.005–1.030)
Squamous Epithelial / LPF: NONE SEEN
pH: 6 (ref 5.0–8.0)

## 2017-05-19 LAB — SURGICAL PCR SCREEN
MRSA, PCR: NEGATIVE
Staphylococcus aureus: NEGATIVE

## 2017-05-19 LAB — APTT: aPTT: 27 seconds (ref 24–36)

## 2017-05-19 NOTE — Pre-Procedure Instructions (Signed)
Jazzmine Kleiman  05/19/2017      [place], [place] - [address] [place] [address] Phone: [phone] Fax: [phone]    Your procedure is scheduled on July 12  Report to [facility].M.  Call this number if you have problems the morning of surgery:  [phone]   Remember:  Do not eat food or drink liquids after midnight.   Take these medicines the morning of surgery with A SIP OF WATER acetaminophen (TYLENOL) if needed, fluticasone (FLONASE),  HYDROcodone-acetaminophen (NORCO)  If needed, levothyroxine (SYNTHROID, LEVOTHROID), traMADol (ULTRAM) if needed  7 days prior to surgery STOP taking any Aspirin, meloxicam (MOBIC), Aleve, Naproxen, Ibuprofen, Motrin, Advil, Goody's, BC's, all herbal medications, fish oil, and all vitamins     Do not wear jewelry, make-up or nail polish.  Do not wear lotions, powders, or perfumes, or deoderant.  Do not shave 48 hours prior to surgery.    Do not bring valuables to the hospital.  [facility] is not responsible for any belongings or valuables.  Contacts, dentures or bridgework may not be worn into surgery.  Leave your suitcase in the car.  After surgery it may be brought to your room.  For patients admitted to the hospital, discharge time will be determined by your treatment team.  Patients discharged the day of surgery will not be allowed to drive home.    Special instructions:   [facility]- Preparing For Surgery  Before surgery, you can play an important role. Because skin is not sterile, your skin needs to be as free of germs as possible. You can reduce the number of germs on your skin by washing with CHG (chlorahexidine gluconate) Soap before surgery.  CHG is an antiseptic cleaner which kills germs and bonds with the skin to continue killing germs even after washing.  Please do not use if you have an allergy to CHG or antibacterial  soaps. If your skin becomes reddened/irritated stop using the CHG.  Do not shave (including legs and underarms) for at least 48 hours prior to first CHG shower. It is OK to shave your face.  Please follow these instructions carefully.   1. Shower the NIGHT BEFORE SURGERY and the MORNING OF SURGERY with CHG.   2. If you chose to wash your hair, wash your hair first as usual with your normal shampoo.  3. After you shampoo, rinse your hair and body thoroughly to remove the shampoo.  4. Use CHG as you would any other liquid soap. You can apply CHG directly to the skin and wash gently with a scrungie or a clean washcloth.   5. Apply the CHG Soap to your body ONLY FROM THE NECK DOWN.  Do not use on open wounds or open sores. Avoid contact with your eyes, ears, mouth and genitals (private parts). Wash genitals (private parts) with your normal soap.  6. Wash thoroughly, paying special attention to the area where your surgery will be performed.  7. Thoroughly rinse your body with warm water from the neck down.  8. DO NOT shower/wash with your normal soap after using and rinsing off the CHG Soap.  9. Pat yourself dry with a CLEAN TOWEL.   10. Wear CLEAN PAJAMAS   11. Place CLEAN SHEETS on your bed the night of your first shower and DO NOT SLEEP WITH PETS.    Day of Surgery: Do not apply any deodorants/lotions.  Please wear clean clothes to the hospital/surgery center.      Please read over the following fact sheets that you were given.

## 2017-05-19 NOTE — Progress Notes (Signed)
PCP - carol webb Cardiologist - denies  Chest x-ray - 05/19/17 EKG - 05/19/17 Stress Test - > 10 years ago ECHO - > 10 years Cardiac Cath - denies     Patient denies shortness of breath, fever, cough and chest pain at PAT appointment   Patient verbalized understanding of instructions that were given to them at the PAT appointment. Patient was also instructed that they will need to review over the PAT instructions again at home before surgery.

## 2017-05-25 ENCOUNTER — Encounter (HOSPITAL_COMMUNITY): Payer: Self-pay | Admitting: *Deleted

## 2017-05-25 ENCOUNTER — Inpatient Hospital Stay (HOSPITAL_COMMUNITY): Payer: Medicare Other | Admitting: Certified Registered"

## 2017-05-25 ENCOUNTER — Encounter (HOSPITAL_COMMUNITY)
Admission: RE | Disposition: A | Discharge status: Home Health | Payer: Self-pay | Source: Ambulatory Visit | Attending: Orthopedic Surgery

## 2017-05-25 ENCOUNTER — Inpatient Hospital Stay (HOSPITAL_COMMUNITY)
Admission: RE | Admit: 2017-05-25 | Discharge: 2017-05-27 | DRG: 483 | Disposition: A | Discharge status: Home Health | Payer: Medicare Other | Source: Ambulatory Visit | Attending: Orthopedic Surgery | Admitting: Orthopedic Surgery

## 2017-05-25 ENCOUNTER — Inpatient Hospital Stay (HOSPITAL_COMMUNITY): Payer: Medicare Other

## 2017-05-25 DIAGNOSIS — E039 Hypothyroidism, unspecified: Secondary | ICD-10-CM | POA: Diagnosis not present

## 2017-05-25 DIAGNOSIS — Z7951 Long term (current) use of inhaled steroids: Secondary | ICD-10-CM

## 2017-05-25 DIAGNOSIS — M75121 Complete rotator cuff tear or rupture of right shoulder, not specified as traumatic: Secondary | ICD-10-CM | POA: Diagnosis not present

## 2017-05-25 DIAGNOSIS — M19011 Primary osteoarthritis, right shoulder: Secondary | ICD-10-CM | POA: Diagnosis not present

## 2017-05-25 DIAGNOSIS — Z791 Long term (current) use of non-steroidal anti-inflammatories (NSAID): Secondary | ICD-10-CM | POA: Diagnosis not present

## 2017-05-25 DIAGNOSIS — M810 Age-related osteoporosis without current pathological fracture: Secondary | ICD-10-CM | POA: Diagnosis present

## 2017-05-25 DIAGNOSIS — I10 Essential (primary) hypertension: Secondary | ICD-10-CM | POA: Diagnosis not present

## 2017-05-25 DIAGNOSIS — Z85828 Personal history of other malignant neoplasm of skin: Secondary | ICD-10-CM

## 2017-05-25 DIAGNOSIS — Z888 Allergy status to other drugs, medicaments and biological substances status: Secondary | ICD-10-CM

## 2017-05-25 DIAGNOSIS — G8918 Other acute postprocedural pain: Secondary | ICD-10-CM | POA: Diagnosis not present

## 2017-05-25 DIAGNOSIS — Z87442 Personal history of urinary calculi: Secondary | ICD-10-CM | POA: Diagnosis not present

## 2017-05-25 DIAGNOSIS — M25511 Pain in right shoulder: Secondary | ICD-10-CM | POA: Diagnosis not present

## 2017-05-25 DIAGNOSIS — Z96611 Presence of right artificial shoulder joint: Secondary | ICD-10-CM

## 2017-05-25 DIAGNOSIS — M75101 Unspecified rotator cuff tear or rupture of right shoulder, not specified as traumatic: Secondary | ICD-10-CM | POA: Diagnosis not present

## 2017-05-25 DIAGNOSIS — Z471 Aftercare following joint replacement surgery: Secondary | ICD-10-CM | POA: Diagnosis not present

## 2017-05-25 DIAGNOSIS — Z79899 Other long term (current) drug therapy: Secondary | ICD-10-CM

## 2017-05-25 HISTORY — PX: REVERSE SHOULDER ARTHROPLASTY: SHX5054

## 2017-05-25 SURGERY — ARTHROPLASTY, SHOULDER, TOTAL, REVERSE
Anesthesia: Regional | Site: Shoulder | Laterality: Right

## 2017-05-25 MED ORDER — FENTANYL CITRATE (PF) 100 MCG/2ML IJ SOLN: 25.0000 ug | INTRAMUSCULAR | Status: DC | PRN

## 2017-05-25 MED ORDER — ACETAMINOPHEN 325 MG PO TABS
650.0000 mg | ORAL_TABLET | Freq: Four times a day (QID) | ORAL | Status: DC | PRN
  Administered 2017-05-26: 650 mg via ORAL
  Filled 2017-05-25: qty 2

## 2017-05-25 MED ORDER — SUMATRIPTAN SUCCINATE 25 MG PO TABS
25.0000 mg | ORAL_TABLET | Freq: Two times a day (BID) | ORAL | Status: DC | PRN
  Filled 2017-05-25: qty 1

## 2017-05-25 MED ORDER — PHENOL 1.4 % MT LIQD: 1.0000 | OROMUCOSAL | Status: DC | PRN

## 2017-05-25 MED ORDER — TRANEXAMIC ACID 1000 MG/10ML IV SOLN
1000.0000 mg | INTRAVENOUS | Status: AC
  Administered 2017-05-25: 1000 mg via INTRAVENOUS
  Filled 2017-05-25: qty 1100

## 2017-05-25 MED ORDER — ONDANSETRON HCL 4 MG PO TABS: 4.0000 mg | ORAL_TABLET | Freq: Four times a day (QID) | ORAL | Status: DC | PRN

## 2017-05-25 MED ORDER — DOCUSATE SODIUM 100 MG PO CAPS
100.0000 mg | ORAL_CAPSULE | Freq: Two times a day (BID) | ORAL | Status: DC
  Administered 2017-05-25 – 2017-05-26 (×3): 100 mg via ORAL
  Filled 2017-05-25 (×4): qty 1

## 2017-05-25 MED ORDER — ROCURONIUM BROMIDE 50 MG/5ML IV SOLN
INTRAVENOUS | Status: AC
  Filled 2017-05-25: qty 1

## 2017-05-25 MED ORDER — METOCLOPRAMIDE HCL 5 MG/ML IJ SOLN: 5.0000 mg | Freq: Three times a day (TID) | INTRAMUSCULAR | Status: DC | PRN

## 2017-05-25 MED ORDER — ROCURONIUM BROMIDE 10 MG/ML (PF) SYRINGE
PREFILLED_SYRINGE | INTRAVENOUS | Status: DC | PRN
  Administered 2017-05-25: 10 mg via INTRAVENOUS
  Administered 2017-05-25: 40 mg via INTRAVENOUS
  Administered 2017-05-25: 10 mg via INTRAVENOUS

## 2017-05-25 MED ORDER — ACETAMINOPHEN 650 MG RE SUPP: 650.0000 mg | Freq: Four times a day (QID) | RECTAL | Status: DC | PRN

## 2017-05-25 MED ORDER — METOCLOPRAMIDE HCL 5 MG PO TABS
5.0000 mg | ORAL_TABLET | Freq: Three times a day (TID) | ORAL | Status: DC | PRN
Start: 2017-05-25 — End: 2017-05-27

## 2017-05-25 MED ORDER — TRAZODONE HCL 100 MG PO TABS: 100.0000 mg | ORAL_TABLET | Freq: Every evening | ORAL | Status: DC | PRN

## 2017-05-25 MED ORDER — PROPOFOL 10 MG/ML IV BOLUS
INTRAVENOUS | Status: AC
  Filled 2017-05-25: qty 20

## 2017-05-25 MED ORDER — BISACODYL 5 MG PO TBEC: 5.0000 mg | DELAYED_RELEASE_TABLET | Freq: Every day | ORAL | Status: DC | PRN

## 2017-05-25 MED ORDER — SODIUM CHLORIDE 0.9% FLUSH
INTRAVENOUS | Status: DC | PRN
  Administered 2017-05-25: 40 mL

## 2017-05-25 MED ORDER — SODIUM CHLORIDE 0.9 % IV SOLN: INTRAVENOUS | Status: DC

## 2017-05-25 MED ORDER — MORPHINE SULFATE (PF) 4 MG/ML IV SOLN
1.0000 mg | INTRAVENOUS | Status: DC | PRN
  Administered 2017-05-26: 1 mg via INTRAVENOUS
  Filled 2017-05-25: qty 1

## 2017-05-25 MED ORDER — LEVOTHYROXINE SODIUM 100 MCG PO TABS
100.0000 ug | ORAL_TABLET | Freq: Every day | ORAL | Status: DC
  Administered 2017-05-26 – 2017-05-27 (×2): 100 ug via ORAL
  Filled 2017-05-25 (×2): qty 1

## 2017-05-25 MED ORDER — ACETAMINOPHEN 500 MG PO TABS
1000.0000 mg | ORAL_TABLET | Freq: Four times a day (QID) | ORAL | Status: AC
  Administered 2017-05-26 (×2): 1000 mg via ORAL
  Filled 2017-05-25 (×5): qty 2

## 2017-05-25 MED ORDER — ONDANSETRON HCL 4 MG/2ML IJ SOLN
INTRAMUSCULAR | Status: AC
  Filled 2017-05-25: qty 2

## 2017-05-25 MED ORDER — ALUM & MAG HYDROXIDE-SIMETH 200-200-20 MG/5ML PO SUSP: 30.0000 mL | ORAL | Status: DC | PRN

## 2017-05-25 MED ORDER — FENTANYL CITRATE (PF) 100 MCG/2ML IJ SOLN
INTRAMUSCULAR | Status: DC | PRN
  Administered 2017-05-25 (×2): 50 ug via INTRAVENOUS

## 2017-05-25 MED ORDER — FENTANYL CITRATE (PF) 250 MCG/5ML IJ SOLN
INTRAMUSCULAR | Status: AC
  Filled 2017-05-25: qty 5

## 2017-05-25 MED ORDER — ASPIRIN EC 325 MG PO TBEC
325.0000 mg | DELAYED_RELEASE_TABLET | Freq: Every day | ORAL | Status: DC
  Administered 2017-05-25 – 2017-05-27 (×3): 325 mg via ORAL
  Filled 2017-05-25 (×3): qty 1

## 2017-05-25 MED ORDER — SUGAMMADEX SODIUM 200 MG/2ML IV SOLN
INTRAVENOUS | Status: DC | PRN
  Administered 2017-05-25: 200 mg via INTRAVENOUS

## 2017-05-25 MED ORDER — ALENDRONATE SODIUM 70 MG PO TABS: 70.0000 mg | ORAL_TABLET | ORAL | Status: DC

## 2017-05-25 MED ORDER — PHENYLEPHRINE HCL 10 MG/ML IJ SOLN
INTRAMUSCULAR | Status: DC | PRN
  Administered 2017-05-25: 25 ug/min via INTRAVENOUS

## 2017-05-25 MED ORDER — BUPIVACAINE-EPINEPHRINE (PF) 0.5% -1:200000 IJ SOLN
INTRAMUSCULAR | Status: DC | PRN
  Administered 2017-05-25: 25 mL via PERINEURAL

## 2017-05-25 MED ORDER — OXYCODONE HCL 5 MG PO TABS
5.0000 mg | ORAL_TABLET | ORAL | Status: DC | PRN
  Administered 2017-05-25 – 2017-05-26 (×6): 10 mg via ORAL
  Filled 2017-05-25 (×6): qty 2

## 2017-05-25 MED ORDER — HYDROCHLOROTHIAZIDE 25 MG PO TABS
25.0000 mg | ORAL_TABLET | Freq: Every day | ORAL | Status: DC
  Administered 2017-05-25 – 2017-05-26 (×2): 25 mg via ORAL
  Filled 2017-05-25 (×2): qty 1

## 2017-05-25 MED ORDER — DEXAMETHASONE SODIUM PHOSPHATE 10 MG/ML IJ SOLN
INTRAMUSCULAR | Status: AC
  Filled 2017-05-25: qty 1

## 2017-05-25 MED ORDER — MIDAZOLAM HCL 2 MG/2ML IJ SOLN
INTRAMUSCULAR | Status: AC
  Filled 2017-05-25: qty 2

## 2017-05-25 MED ORDER — FLEET ENEMA 7-19 GM/118ML RE ENEM: 1.0000 | ENEMA | Freq: Once | RECTAL | Status: DC | PRN

## 2017-05-25 MED ORDER — ONDANSETRON HCL 4 MG/2ML IJ SOLN
INTRAMUSCULAR | Status: DC | PRN
  Administered 2017-05-25: 4 mg via INTRAVENOUS

## 2017-05-25 MED ORDER — CEFAZOLIN SODIUM-DEXTROSE 1-4 GM/50ML-% IV SOLN
1.0000 g | Freq: Four times a day (QID) | INTRAVENOUS | Status: AC
  Administered 2017-05-25 – 2017-05-26 (×3): 1 g via INTRAVENOUS
  Filled 2017-05-25 (×3): qty 50

## 2017-05-25 MED ORDER — DIPHENHYDRAMINE HCL 12.5 MG/5ML PO ELIX: 12.5000 mg | ORAL_SOLUTION | ORAL | Status: DC | PRN

## 2017-05-25 MED ORDER — LISINOPRIL 20 MG PO TABS
20.0000 mg | ORAL_TABLET | Freq: Every day | ORAL | Status: DC
  Administered 2017-05-25 – 2017-05-26 (×2): 20 mg via ORAL
  Filled 2017-05-25 (×2): qty 1

## 2017-05-25 MED ORDER — PROPOFOL 10 MG/ML IV BOLUS
INTRAVENOUS | Status: DC | PRN
  Administered 2017-05-25: 110 mg via INTRAVENOUS
  Administered 2017-05-25: 10 mg via INTRAVENOUS

## 2017-05-25 MED ORDER — LIDOCAINE HCL (CARDIAC) 20 MG/ML IV SOLN
INTRAVENOUS | Status: AC
  Filled 2017-05-25: qty 5

## 2017-05-25 MED ORDER — OXYCODONE HCL 5 MG PO TABS: 5.0000 mg | ORAL_TABLET | Freq: Once | ORAL | Status: DC | PRN

## 2017-05-25 MED ORDER — POLYETHYLENE GLYCOL 3350 17 G PO PACK: 17.0000 g | PACK | Freq: Every day | ORAL | Status: DC | PRN

## 2017-05-25 MED ORDER — L-LYSINE 1000 MG PO TABS: 1000.0000 mg | ORAL_TABLET | Freq: Every day | ORAL | Status: DC

## 2017-05-25 MED ORDER — POVIDONE-IODINE 7.5 % EX SOLN: Freq: Once | CUTANEOUS | Status: DC

## 2017-05-25 MED ORDER — LACTATED RINGERS IV SOLN
INTRAVENOUS | Status: DC | PRN
  Administered 2017-05-25 (×2): via INTRAVENOUS

## 2017-05-25 MED ORDER — MENTHOL 3 MG MT LOZG: 1.0000 | LOZENGE | OROMUCOSAL | Status: DC | PRN

## 2017-05-25 MED ORDER — CEFAZOLIN SODIUM-DEXTROSE 2-4 GM/100ML-% IV SOLN
2.0000 g | INTRAVENOUS | Status: AC
  Administered 2017-05-25: 2 g via INTRAVENOUS
  Filled 2017-05-25: qty 100

## 2017-05-25 MED ORDER — ONDANSETRON HCL 4 MG/2ML IJ SOLN
4.0000 mg | Freq: Four times a day (QID) | INTRAMUSCULAR | Status: DC | PRN
  Administered 2017-05-26: 4 mg via INTRAVENOUS
  Filled 2017-05-25: qty 2

## 2017-05-25 MED ORDER — SUGAMMADEX SODIUM 200 MG/2ML IV SOLN
INTRAVENOUS | Status: AC
  Filled 2017-05-25: qty 2

## 2017-05-25 MED ORDER — MIDAZOLAM HCL 5 MG/5ML IJ SOLN
INTRAMUSCULAR | Status: DC | PRN
  Administered 2017-05-25 (×2): 1 mg via INTRAVENOUS

## 2017-05-25 MED ORDER — 0.9 % SODIUM CHLORIDE (POUR BTL) OPTIME
TOPICAL | Status: DC | PRN
  Administered 2017-05-25: 1000 mL

## 2017-05-25 MED ORDER — BUPIVACAINE LIPOSOME 1.3 % IJ SUSP
20.0000 mL | INTRAMUSCULAR | Status: AC
  Administered 2017-05-25: 20 mL
  Filled 2017-05-25: qty 20

## 2017-05-25 MED ORDER — DEXAMETHASONE SODIUM PHOSPHATE 10 MG/ML IJ SOLN
INTRAMUSCULAR | Status: DC | PRN
  Administered 2017-05-25: 5 mg via INTRAVENOUS

## 2017-05-25 MED ORDER — OXYCODONE HCL 5 MG/5ML PO SOLN: 5.0000 mg | Freq: Once | ORAL | Status: DC | PRN

## 2017-05-25 MED ORDER — SODIUM CHLORIDE 0.9 % IR SOLN
Status: DC | PRN
  Administered 2017-05-25: 3000 mL

## 2017-05-25 SURGICAL SUPPLY — 79 items
BASEPLATE P2 COATD GLND 6.5X30 (Shoulder) ×1 IMPLANT
BIT DRILL 5/64X5 DISP (BIT) ×2 IMPLANT
BLADE SAW SAG 73X25 THK (BLADE) ×1
BLADE SAW SGTL 73X25 THK (BLADE) ×1 IMPLANT
BLADE SURG 15 STRL LF DISP TIS (BLADE) IMPLANT
BLADE SURG 15 STRL SS (BLADE)
BOWL SMART MIX CTS (DISPOSABLE) ×2 IMPLANT
CEMENT BONE DEPUY (Cement) ×2 IMPLANT
CHLORAPREP W/TINT 26ML (MISCELLANEOUS) ×2 IMPLANT
CLSR STERI-STRIP ANTIMIC 1/2X4 (GAUZE/BANDAGES/DRESSINGS) ×2 IMPLANT
COVER MAYO STAND STRL (DRAPES) IMPLANT
COVER SURGICAL LIGHT HANDLE (MISCELLANEOUS) ×2 IMPLANT
DRAPE INCISE IOBAN 66X45 STRL (DRAPES) ×2 IMPLANT
DRAPE ORTHO SPLIT 77X108 STRL (DRAPES) ×2
DRAPE SURG ORHT 6 SPLT 77X108 (DRAPES) ×2 IMPLANT
DRSG AQUACEL AG ADV 3.5X10 (GAUZE/BANDAGES/DRESSINGS) ×2 IMPLANT
ELECT BLADE 4.0 EZ CLEAN MEGAD (MISCELLANEOUS) ×2
ELECT REM PT RETURN 9FT ADLT (ELECTROSURGICAL) ×2
ELECTRODE BLDE 4.0 EZ CLN MEGD (MISCELLANEOUS) ×1 IMPLANT
ELECTRODE REM PT RTRN 9FT ADLT (ELECTROSURGICAL) ×1 IMPLANT
EVACUATOR 1/8 PVC DRAIN (DRAIN) IMPLANT
GLOVE BIO SURGEON STRL SZ7 (GLOVE) ×2 IMPLANT
GLOVE BIO SURGEON STRL SZ7.5 (GLOVE) ×2 IMPLANT
GLOVE BIOGEL PI IND STRL 7.0 (GLOVE) ×1 IMPLANT
GLOVE BIOGEL PI IND STRL 8 (GLOVE) ×1 IMPLANT
GLOVE BIOGEL PI INDICATOR 7.0 (GLOVE) ×1
GLOVE BIOGEL PI INDICATOR 8 (GLOVE) ×1
GOWN STRL REUS W/ TWL LRG LVL3 (GOWN DISPOSABLE) ×3 IMPLANT
GOWN STRL REUS W/ TWL XL LVL3 (GOWN DISPOSABLE) ×1 IMPLANT
GOWN STRL REUS W/TWL LRG LVL3 (GOWN DISPOSABLE) ×3
GOWN STRL REUS W/TWL XL LVL3 (GOWN DISPOSABLE) ×1
HANDPIECE INTERPULSE COAX TIP (DISPOSABLE) ×1
HOOD PEEL AWAY FLYTE STAYCOOL (MISCELLANEOUS) ×4 IMPLANT
INSERT SMALL SOCKET 32MM NEU (Insert) ×2 IMPLANT
KIT BASIN OR (CUSTOM PROCEDURE TRAY) ×2 IMPLANT
KIT ROOM TURNOVER OR (KITS) ×2 IMPLANT
MANIFOLD NEPTUNE II (INSTRUMENTS) ×2 IMPLANT
NEEDLE HYPO 25GX1X1/2 BEV (NEEDLE) IMPLANT
NEEDLE MAYO TROCAR (NEEDLE) IMPLANT
NEEDLE SPNL 22GX3.5 QUINCKE BK (NEEDLE) ×2 IMPLANT
NOZZLE PRISM 8.5MM (MISCELLANEOUS) IMPLANT
NS IRRIG 1000ML POUR BTL (IV SOLUTION) ×2 IMPLANT
P2 COATDE GLNOID BSEPLT 6.5X30 (Shoulder) ×2 IMPLANT
PACK SHOULDER (CUSTOM PROCEDURE TRAY) ×2 IMPLANT
PAD ARMBOARD 7.5X6 YLW CONV (MISCELLANEOUS) ×4 IMPLANT
RESTRAINT HEAD UNIVERSAL NS (MISCELLANEOUS) ×2 IMPLANT
RETRIEVER SUT HEWSON (MISCELLANEOUS) IMPLANT
SCREW BONE LOCKING RSP 5.0X14 (Screw) ×4 IMPLANT
SCREW BONE LOCKING RSP 5.0X30 (Screw) ×2 IMPLANT
SCREW BONE RSP LOCK 5X14 (Screw) ×2 IMPLANT
SCREW BONE RSP LOCK 5X26 (Screw) ×1 IMPLANT
SCREW BONE RSP LOCK 5X30 (Screw) ×1 IMPLANT
SCREW BONE RSP LOCKING 5.0X26 (Screw) ×2 IMPLANT
SCREW RETAIN W/HEAD 4MM OFFSET (Shoulder) ×2 IMPLANT
SET HNDPC FAN SPRY TIP SCT (DISPOSABLE) ×1 IMPLANT
SLING ARM FOAM STRAP LRG (SOFTGOODS) IMPLANT
SLING ARM FOAM STRAP MED (SOFTGOODS) ×2 IMPLANT
SPONGE LAP 18X18 X RAY DECT (DISPOSABLE) ×2 IMPLANT
SPONGE LAP 4X18 X RAY DECT (DISPOSABLE) IMPLANT
STEM HUMERAL REV SHL 6X108 SM (Stem) ×2 IMPLANT
STRIP CLOSURE SKIN 1/2X4 (GAUZE/BANDAGES/DRESSINGS) ×2 IMPLANT
SUCTION FRAZIER HANDLE 10FR (MISCELLANEOUS) ×1
SUCTION TUBE FRAZIER 10FR DISP (MISCELLANEOUS) ×1 IMPLANT
SUPPORT WRAP ARM LG (MISCELLANEOUS) ×2 IMPLANT
SUT ETHIBOND NAB CT1 #1 30IN (SUTURE) ×2 IMPLANT
SUT FIBERWIRE #2 38 T-5 BLUE (SUTURE) ×2
SUT MNCRL AB 4-0 PS2 18 (SUTURE) ×2 IMPLANT
SUT SILK 2 0 TIES 17X18 (SUTURE)
SUT SILK 2-0 18XBRD TIE BLK (SUTURE) IMPLANT
SUT VIC AB 2-0 CT1 27 (SUTURE) ×1
SUT VIC AB 2-0 CT1 TAPERPNT 27 (SUTURE) ×1 IMPLANT
SUTURE FIBERWR #2 38 T-5 BLUE (SUTURE) ×1 IMPLANT
SYR 30ML LL (SYRINGE) IMPLANT
SYRINGE TOOMEY DISP (SYRINGE) IMPLANT
TAPE FIBER 2MM 7IN #2 BLUE (SUTURE) IMPLANT
TOWEL OR 17X24 6PK STRL BLUE (TOWEL DISPOSABLE) ×2 IMPLANT
TOWEL OR 17X26 10 PK STRL BLUE (TOWEL DISPOSABLE) IMPLANT
WATER STERILE IRR 1000ML POUR (IV SOLUTION) IMPLANT
YANKAUER SUCT BULB TIP NO VENT (SUCTIONS) IMPLANT

## 2017-05-25 NOTE — Op Note (Signed)
Procedure(s): REVERSE SHOULDER ARTHROPLASTY Procedure Note  Marie Burns female 71 y.o. 05/25/2017  Procedure(s) and Anesthesia Type:    * RIGHT REVERSE SHOULDER ARTHROPLASTY - General   Indications:  71 y.o. female  With right shoulder arthritis with irrepairable rotator cuff tear. Pain and dysfunction interfered with quality of life and nonoperative treatment with activity modification, NSAIDS and injections failed.     Surgeon: Nita Sells   Assistants: Jeanmarie Hubert PA-C [facility] was present and scrubbed throughout the procedure and was essential in positioning, retraction, exposure, and closure)  Anesthesia: General endotracheal anesthesia with preoperative interscalene block given by the attending anesthesiologist    Procedure Detail  Right REVERSE SHOULDER ARTHROPLASTY   Estimated Blood Loss:  200 mL         Drains: none  Blood Given: none          Specimens: none        Complications:  * No complications entered in OR log *         Disposition: PACU - hemodynamically stable.         Condition: stable      OPERATIVE FINDINGS:  A DJO Altivate cemented reverse total shoulder arthroplasty was placed with a  size 6 stem, a 32-4 glenosphere, and a 0-mm poly insert. The base plate  fixation was good.  PROCEDURE: The patient was identified in the preoperative holding area  where I personally marked the operative site after verifying site, side,  and procedure with the patient. An interscalene block given by  the attending anesthesiologist in the holding area and the patient was taken back to the operating room where all extremities were  carefully padded in position after general anesthesia was induced. She  was placed in a beach-chair position and the operative upper extremity was  prepped and draped in a standard sterile fashion. An approximately 10-  cm incision was made from the tip of the coracoid process to the center  point of the  humerus at the level of the axilla. Dissection was carried  down through subcutaneous tissues to the level of the cephalic vein  which was taken laterally with the deltoid. The pectoralis major was  retracted medially. The subdeltoid space was developed and the lateral  edge of the conjoined tendon was identified. The undersurface of  conjoined tendon was palpated and the musculocutaneous nerve was not in  the field. Retractor was placed underneath the conjoined and second  retractor was placed lateral into the deltoid. The circumflex humeral  artery and vessels were identified and clamped and coagulated. The  biceps tendon was absent.  The subscapularis was chronically torn.  The  joint was then gently externally rotated while the capsule was released  from the humeral neck around to just beyond the 6 o'clock position. At  this point, the joint was dislocated and the humeral head was presented  into the wound. The excessive osteophyte formation was removed with a  large rongeur.  The cutting guide was used to make the appropriate  head cut and the head was saved for potentially bone grafting.  The glenoid was exposed with the arm in an  abducted extended position. The anterior and posterior labrum were  completely excised and the capsule was released circumferentially to  allow for exposure of the glenoid for preparation. The 2.5 mm drill was  placed using the guide in 5-10 inferior angulation and the tap was then advanced in the same hole. Small and large reamers were then  used. The tap was then removed and the Metaglene was then screwed in with good purchase.  The peripheral guide was then used to drilled measured and filled peripheral locking screws. The size  32-4  glenosphere was then impacted on the [facility] taper and the central screw was placed. The humerus was then again exposed and the diaphyseal reamers were used followed by the metaphyseal reamers. The final broach was left in place in  the proximal trial was placed. She was noted to be very osteoporotic with very thin cortices. Was unable to obtain a good press fit. Given her extremely small size it was very difficult to initially reduce the implants secondary to close proximity of the conjoined tendon. After successful implantation of the trial with reduction was excellent stability and range of motion. Therefore, final humeral stem was placed cemented.  And then the trial polyethylene inserts were tested again and the above implant was felt to be the most appropriate for final insertion. The joint was reduced taken through full range of motion and felt to be stable. Soft tissue tension was appropriate.  The joint was then copiously irrigated with pulse  lavage and the wound was then closed. The subscapularis was not repaired.  Skin was closed with 2-0 Vicryl in a deep dermal layer and 4-0  Monocryl for skin closure. Steri-Strips were applied. Sterile  dressings were then applied as well as a sling. The patient was allowed  to awaken from general anesthesia, transferred to stretcher, and taken  to recovery room in stable condition.   POSTOPERATIVE PLAN: The patient will be kept in the hospital postoperatively  for pain control and therapy.

## 2017-05-25 NOTE — Discharge Instructions (Signed)

## 2017-05-25 NOTE — Transfer of Care (Signed)
Immediate Anesthesia Transfer of Care Note  Patient: Marie Burns  Procedure(s) Performed: Procedure(s) with comments: REVERSE SHOULDER ARTHROPLASTY (Right) - RIGHT REVERSE TOTAL SHOULDER ARTHROPLASTY  Patient Location: PACU  Anesthesia Type:GA combined with regional for post-op pain  Level of Consciousness: awake, oriented and patient cooperative  Airway & Oxygen Therapy: Patient Spontanous Breathing and Patient connected to nasal cannula oxygen  Post-op Assessment: Report given to RN, Post -op Vital signs reviewed and stable and Patient moving all extremities  Post vital signs: Reviewed and stable  Last Vitals:  Vitals:   05/25/17 0558 05/25/17 0949  BP: (!) 164/77   Pulse: 87   Resp: 16   Temp: 36.8 C 36.5 C    Last Pain:  Vitals:   05/25/17 0558  TempSrc: Oral  PainSc: 9          Complications: No apparent anesthesia complications

## 2017-05-25 NOTE — Anesthesia Preprocedure Evaluation (Addendum)
Anesthesia Evaluation  Patient identified by MRN, date of birth, ID band Patient awake    Reviewed: Allergy & Precautions, NPO status , Patient's Chart, lab work & pertinent test results  History of Anesthesia Complications (+) PROLONGED EMERGENCE and history of anesthetic complications  Airway Mallampati: III  TM Distance: >3 FB Neck ROM: Limited    Dental  (+) Edentulous Upper, Edentulous Lower, Dental Advisory Given   Pulmonary neg pulmonary ROS,    breath sounds clear to auscultation       Cardiovascular hypertension, Pt. on medications (-) angina(-) Past MI and (-) CHF (-) dysrhythmias  Rhythm:Regular     Neuro/Psych neg Seizures  Neuromuscular disease negative psych ROS   GI/Hepatic negative GI ROS, Neg liver ROS,   Endo/Other  Hypothyroidism   Renal/GU negative Renal ROS     Musculoskeletal  (+) Arthritis ,   Abdominal   Peds  Hematology negative hematology ROS (+)   Anesthesia Other Findings   Reproductive/Obstetrics                            Anesthesia Physical Anesthesia Plan  ASA: II  Anesthesia Plan: General and Regional   Post-op Pain Management:  Regional for Post-op pain   Induction: Intravenous  PONV Risk Score and Plan: 3 and Ondansetron, Dexamethasone, Propofol and Midazolam  Airway Management Planned: Oral ETT  Additional Equipment: None  Intra-op Plan:   Post-operative Plan: Extubation in OR  Informed Consent: I have reviewed the patients History and Physical, chart, labs and discussed the procedure including the risks, benefits and alternatives for the proposed anesthesia with the patient or authorized representative who has indicated his/her understanding and acceptance.   Dental advisory given  Plan Discussed with: CRNA, Anesthesiologist and Surgeon  Anesthesia Plan Comments:         Anesthesia Quick Evaluation

## 2017-05-25 NOTE — Anesthesia Procedure Notes (Addendum)
Anesthesia Regional Block: Interscalene brachial plexus block   Pre-Anesthetic Checklist: ,, timeout performed, Correct Patient, Correct Site, Correct Laterality, Correct Procedure, Correct Position, site marked, Risks and benefits discussed,  Surgical consent,  Pre-op evaluation,  At surgeon's request and post-op pain management  Laterality: Upper and Right  Prep: chloraprep       Needles:  Injection technique: Single-shot  Needle Type: Echogenic Stimulator Needle          Additional Needles:   Procedures: ultrasound guided,,,,,,,,  Narrative:  Start time: 05/25/2017 7:21 AM End time: 05/25/2017 7:26 AM Injection made incrementally with aspirations every 5 mL.  Performed by: Personally  Anesthesiologist: Rim Thatch  Additional Notes: H+P and labs reviewed, risks and benefits discussed with patient, procedure tolerated well without complications

## 2017-05-25 NOTE — H&P (Signed)
Marie Burns is an 71 y.o. female.   Chief Complaint: R shoulder pain and dysfunction HPI: Endstage R shoulder arthritis with chronic RCT and significant pain and dysfunction, failed conservative measures.  Pain interferes with sleep and quality of life.   Past Medical History:  Diagnosis Date  . Arthritis    right shoulder  . Complication of anesthesia    hard to wake up  . History of kidney stones   . Hypertension   . Hypothyroidism   . Pneumonia    history  . RSD upper limb    left   . Skin cancer    chest    Past Surgical History:  Procedure Laterality Date  . APPENDECTOMY    . BREAST SURGERY Left    lymphnodes removed in the left arm X2    History reviewed. No pertinent family history. Social History:  reports that she has never smoked. She has never used smokeless tobacco. She reports that she does not drink alcohol or use drugs.  Allergies:  Allergies  Allergen Reactions  . Gabapentin Other (See Comments)    UNSPECIFIED "URINARY ISSUES"    Medications Prior to Admission  Medication Sig Dispense Refill  . acetaminophen (TYLENOL) 500 MG tablet Take 1,000 mg by mouth every 6 (six) hours as needed (for pain.).    Marland Kitchen alendronate (FOSAMAX) 70 MG tablet Take 70 mg by mouth every Sunday.    . cholecalciferol (VITAMIN D) 1000 units tablet Take 1,000 Units by mouth daily.    . fluticasone (FLONASE) 50 MCG/ACT nasal spray Place 1 spray into both nostrils daily.    . hydrochlorothiazide (HYDRODIURIL) 25 MG tablet Take 25 mg by mouth daily at 12 noon.    Marland Kitchen HYDROcodone-acetaminophen (NORCO) 7.5-325 MG tablet Take 1 tablet by mouth 2 (two) times daily as needed. For pain.    Marland Kitchen L-Lysine 1000 MG TABS Take 1,000 mg by mouth daily.    Marland Kitchen levothyroxine (SYNTHROID, LEVOTHROID) 100 MCG tablet Take 100 mcg by mouth daily before breakfast.    . lisinopril (PRINIVIL,ZESTRIL) 20 MG tablet Take 20 mg by mouth daily at 12 noon.    . meloxicam (MOBIC) 15 MG tablet Take 15 mg by mouth  daily at 2 PM.    . nystatin cream (MYCOSTATIN) Apply 1 application topically 2 (two) times daily as needed. For irritation/skin rash.    . SUMAtriptan (IMITREX) 25 MG tablet Take 25 mg by mouth 2 (two) times daily as needed. For migraine headaches.    . traMADol (ULTRAM) 50 MG tablet Take 100 mg by mouth every 6 (six) hours as needed. For pain.    . traZODone (DESYREL) 100 MG tablet Take 100 mg by mouth at bedtime as needed for sleep.    . valACYclovir (VALTREX) 500 MG tablet Take 500 mg by mouth 2 (two) times daily as needed (for cold sore/fever blister).      No results found for this or any previous visit (from the past 48 hour(s)). No results found.  Review of Systems  All other systems reviewed and are negative.   Blood pressure (!) 164/77, pulse 87, temperature 98.2 F (36.8 C), temperature source Oral, resp. rate 16, height 4\' 10"  (1.473 m), weight 63.5 kg (140 lb), SpO2 98 %. Physical Exam  Constitutional: She is oriented to person, place, and time. She appears well-developed and well-nourished.  HENT:  Head: Atraumatic.  Eyes: EOM are normal.  Cardiovascular: Intact distal pulses.   Respiratory: Effort normal.  Musculoskeletal:  R shoulder  pain with limited ROM. NVID.  Neurological: She is alert and oriented to person, place, and time.  Skin: Skin is warm and dry.  Psychiatric: She has a normal mood and affect.     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  Pain interferes with sleep and quality of life. Risks / benefits of surgery discussed Consent on chart  NPO for OR Preop antibiotics   Nita Sells, MD 05/25/2017, 7:15 AM

## 2017-05-25 NOTE — Anesthesia Procedure Notes (Signed)
Procedure Name: Intubation Date/Time: 05/25/2017 7:44 AM Performed by: Melina Copa, Kaylei Frink R Pre-anesthesia Checklist: Patient identified, Emergency Drugs available, Suction available and Patient being monitored Patient Re-evaluated:Patient Re-evaluated prior to induction Oxygen Delivery Method: Circle System Utilized Preoxygenation: Pre-oxygenation with 100% oxygen Induction Type: IV induction Ventilation: Mask ventilation without difficulty Laryngoscope Size: Mac and 3 Grade View: Grade II Tube type: Oral Tube size: 7.5 mm Number of attempts: 1 Airway Equipment and Method: Stylet Placement Confirmation: ETT inserted through vocal cords under direct vision,  positive ETCO2 and breath sounds checked- equal and bilateral Secured at: 21 cm Tube secured with: Tape Dental Injury: Teeth and Oropharynx as per pre-operative assessment

## 2017-05-26 ENCOUNTER — Encounter (HOSPITAL_COMMUNITY): Payer: Self-pay | Admitting: Orthopedic Surgery

## 2017-05-26 MED ORDER — OXYCODONE-ACETAMINOPHEN 5-325 MG PO TABS: 1.0000 | ORAL_TABLET | ORAL | 0 refills | Status: DC | PRN

## 2017-05-26 MED ORDER — MORPHINE SULFATE (PF) 4 MG/ML IV SOLN
1.0000 mg | INTRAVENOUS | Status: DC | PRN
  Administered 2017-05-26: 1 mg via INTRAVENOUS
  Filled 2017-05-26: qty 1

## 2017-05-26 MED ORDER — OXYCODONE HCL 5 MG PO TABS
5.0000 mg | ORAL_TABLET | ORAL | Status: DC | PRN
  Administered 2017-05-26: 15 mg via ORAL
  Administered 2017-05-26: 10 mg via ORAL
  Administered 2017-05-26 (×3): 15 mg via ORAL
  Administered 2017-05-26: 5 mg via ORAL
  Administered 2017-05-27 (×3): 15 mg via ORAL
  Filled 2017-05-26 (×5): qty 3
  Filled 2017-05-26: qty 1
  Filled 2017-05-26: qty 2
  Filled 2017-05-26 (×2): qty 3

## 2017-05-26 MED ORDER — DOCUSATE SODIUM 100 MG PO CAPS: 100.0000 mg | ORAL_CAPSULE | Freq: Three times a day (TID) | ORAL | 0 refills | Status: DC | PRN

## 2017-05-26 NOTE — Care Management Note (Signed)
Case Management Note  Patient Details  Name: Marie Burns MRN: [id number] Date of Birth: 1945/12/23  Subjective/Objective:     71 yr old female s/p right reverse shoulder arthroplasty.                Action/Plan: CM received call that patient is setup with [facility], no changes.   Expected Discharge Date:  05/26/17               Expected Discharge Plan:  [place]  In-House Referral:  NA  Discharge planning Services  CM Consult  Post Acute Care Choice:  Home Health Choice offered to:  Patient  DME Arranged:  N/A DME Agency:  NA  HH Arranged:  OT, PT [place] Agency:  [facility] (formerly [employer])  Status of Service:  Completed, signed off  If discussed at [employer], dates discussed:    Additional Comments:  Ninfa Meeker, RN 05/26/2017, 4:06 PM

## 2017-05-26 NOTE — Progress Notes (Signed)
Occupational Therapy Evaluation Patient Details Name: Marie Burns MRN: [id number] DOB: 1946/02/11 Today's Date: 05/26/2017    History of Present Illness 71 y.o. female  With right shoulder arthritis with irrepairable rotator cuff tear.  Underwent RIGHT REVERSE SHOULDER ARTHROPLASTY    Clinical Impression   PTA, pt lived alone and was independent with ADL and mobility. Evaluation limited due to pain. Attempted this am, however too painful. Returned later this am and pt limited by pain - pt would not allow therapist to remove sling, however, agreed to hand ROM and stated the "doctor told me not to move my elbow". Attempted to educate pt on protocol written in chart for elbow/wrist and hand ROM.  Began education with pt/family regarding compensatory techniques for ADL, positioning/use of ice of RUE in sitting to reduce pain and discussing DC plan. Pt states she will be alone after her daughter leaves on Wednesday. At this time, recommend follow up with HHOT/PT to address ADL and IADL tasks since caregiver support will be limited.     Follow Up Recommendations  Home health OT;Supervision/Assistance - 24 hour (initially)    Equipment Recommendations  3 in 1 bedside commode    Recommendations for Other Services PT consult     Precautions / Restrictions Precautions Precautions: Shoulder Type of Shoulder Precautions: No ROMR shoulder; AROM R elbow/wrist/hand Shoulder Interventions: At all times;Off for dressing/bathing/exercises Precaution Booklet Issued: Yes (comment) Required Braces or Orthoses: Sling Restrictions Weight Bearing Restrictions: Yes (NWB)      Mobility Bed Mobility               General bed mobility comments: Pt plans to sleep in recliner  Transfers                      Balance                                           ADL either performed or assessed with clinical judgement   ADL Overall ADL's : Needs  assistance/impaired Eating/Feeding: Set up   Grooming: Moderate assistance   Upper Body Bathing: Moderate assistance;Sitting   Lower Body Bathing: Moderate assistance;Sit to/from stand   Upper Body Dressing : Maximal assistance;Sitting   Lower Body Dressing: Moderate assistance   Toilet Transfer: Supervision/safety   Toileting- Clothing Manipulation and Hygiene: Minimal assistance         General ADL Comments: Began education on compensatory techniques for ADL. Pt stated "I'm use to not using my arm." After explaining that she could not use her arm for tasks that she did prior to surgery, pt stated "then how am i going to do those thing". Educated on role of OT and plan of care. Began education on positioning to increase comfort adn sling mangement. Pt would not allow therapist to remove sling.      Vision         Perception     Praxis      Pertinent Vitals/Pain Pain Assessment: 0-10 Pain Score: 10-Worst pain ever Pain Location: RUE Pain Descriptors / Indicators: Aching;Throbbing;Moaning Pain Intervention(s): Limited activity within patient's tolerance;Repositioned;Patient requesting pain meds-RN notified;Relaxation;Ice applied     Hand Dominance Left   Extremity/Trunk Assessment Upper Extremity Assessment Upper Extremity Assessment: RUE deficits/detail RUE Deficits / Details: hand/wrist ROM WFL. Pt would not allow elbow ROM RUE Coordination: decreased fine motor;decreased gross motor   Lower Extremity  Assessment Lower Extremity Assessment: Overall WFL for tasks assessed       Communication Communication Communication: No difficulties   Cognition Arousal/Alertness: Awake/alert Behavior During Therapy: Agitated;Anxious Overall Cognitive Status: Within Functional Limits for tasks assessed                                     General Comments   Focus of session on positioning, edema control and reducing pain    Exercises Exercises:  Shoulder;Other exercises Shoulder Exercises Wrist Flexion: AROM;Right;5 reps Wrist Extension: AROM;Right;5 reps Digit Composite Flexion: AROM;Right;10 reps Composite Extension: AROM;Right;10 reps Neck Flexion: AROM;5 reps Neck Extension: AROM;5 reps Neck Lateral Flexion - Right: AROM;5 reps Neck Lateral Flexion - Left: AROM;5 reps   Shoulder Instructions Shoulder Instructions Correct positioning of sling/immobilizer:  (began education) Sling wearing schedule (on at all times/off for ADL's):  (educated on sling wearing time) Positioning of UE while sleeping: Moderate assistance    Home Living Family/patient expects to be discharged to:: Private residence Living Arrangements: Alone;Children Available Help at Discharge: Family;Available PRN/intermittently Type of Home: Apartment Home Access: Stairs to enter Entrance Stairs-Number of Steps: 15 Entrance Stairs-Rails: Right;Left Home Layout: One level     Bathroom Shower/Tub: Corporate investment banker: Standard Bathroom Accessibility: No   Home Equipment: None          Prior Functioning/Environment Level of Independence: Independent                 OT Problem List: Decreased strength;Decreased range of motion;Decreased safety awareness;Decreased knowledge of use of DME or AE;Decreased knowledge of precautions;Pain;Impaired UE functional use      OT Treatment/Interventions: Self-care/ADL training;Therapeutic exercise;DME and/or AE instruction;Therapeutic activities;Patient/family education    OT Goals(Current goals can be found in the care plan section) Acute Rehab OT Goals Patient Stated Goal: to get rid of pain OT Goal Formulation: With patient Time For Goal Achievement: 06/02/17 Potential to Achieve Goals: Good ADL Goals Pt Will Perform Grooming: with min assist;with caregiver independent in assisting;sitting Pt Will Perform Upper Body Bathing: with min assist;with caregiver independent in  assisting;sitting Pt Will Perform Upper Body Dressing: with min assist;with caregiver independent in assisting;sitting Pt Will Perform Lower Body Dressing: with min guard assist;sit to/from stand;with caregiver independent in assisting Pt Will Transfer to Toilet: with modified independence;ambulating Pt Will Perform Toileting - Clothing Manipulation and hygiene: with modified independence;sit to/from stand Pt/caregiver will Perform Home Exercise Program: Right Upper extremity;With written HEP provided;With Supervision (per protocol - elbow/wrist/hand)  OT Frequency: Min 3X/week   Barriers to D/C:            Co-evaluation              AM-PAC PT "6 Clicks" Daily Activity     Outcome Measure Help from another person eating meals?: A Little Help from another person taking care of personal grooming?: A Lot Help from another person toileting, which includes using toliet, bedpan, or urinal?: A Little Help from another person bathing (including washing, rinsing, drying)?: A Lot Help from another person to put on and taking off regular upper body clothing?: A Lot Help from another person to put on and taking off regular lower body clothing?: A Little 6 Click Score: 15   End of Session Nurse Communication: Precautions;Patient requests pain meds;Other (comment) (shoulder management)  Activity Tolerance: Patient limited by pain Patient left: in chair;with call bell/phone within reach;with family/visitor present;with SCD's  reapplied  OT Visit Diagnosis: Muscle weakness (generalized) (M62.81);Pain Pain - Right/Left: Right Pain - part of body: Shoulder                Time: 6886-4847 OT Time Calculation (min): 33 min Charges:  OT General Charges $OT Visit: 1 Procedure OT Evaluation $OT Eval Moderate Complexity: 1 Procedure OT Treatments $Self Care/Home Management : 8-22 mins G-Codes:     [facility], OT/L  [phone] 05/26/2017  Khalil Belote,HILLARY 05/26/2017, 1:37 PM

## 2017-05-26 NOTE — Progress Notes (Signed)
   PATIENT ID: Marie Burns   1 Day Post-Op Procedure(s) (LRB): REVERSE SHOULDER ARTHROPLASTY (Right)  Subjective: Doing okay this am. Having throbbing pain in right shoulder, block wore off at 1am. Waiting for pain rx.   Objective:  Vitals:   05/26/17 0034 05/26/17 0420  BP: (!) 117/59 (!) 117/47  Pulse: 71 69  Resp: 18 18  Temp: 99.9 F (37.7 C) 98.4 F (36.9 C)     R shoulder dressing c/d/i Wiggles fingers, distally NVI  Labs:  No results for input(s): HGB in the last 72 hours.No results for input(s): WBC, RBC, HCT, PLT in the last 72 hours.No results for input(s): NA, K, CL, CO2, BUN, CREATININE, GLUCOSE, CALCIUM in the last 72 hours.  Assessment and Plan: 1 day s/p right reverse TSA Continue with pain mgmt Plan to d/c home today with family but may need to stay another night for pain control Fu with Dr. Tamera Punt in 2 weeks   VTE proph: ASA, SCDs

## 2017-05-26 NOTE — Anesthesia Postprocedure Evaluation (Signed)
Anesthesia Post Note  Patient: Marie Burns  Procedure(s) Performed: Procedure(s) (LRB): REVERSE SHOULDER ARTHROPLASTY (Right)     Patient location during evaluation: PACU Anesthesia Type: Regional and General Level of consciousness: awake and alert Pain management: pain level controlled Vital Signs Assessment: post-procedure vital signs reviewed and stable Respiratory status: spontaneous breathing, nonlabored ventilation, respiratory function stable and patient connected to nasal cannula oxygen Cardiovascular status: blood pressure returned to baseline and stable Postop Assessment: no signs of nausea or vomiting Anesthetic complications: no    Last Vitals:  Vitals:   05/26/17 0034 05/26/17 0420  BP: (!) 117/59 (!) 117/47  Pulse: 71 69  Resp: 18 18  Temp: 37.7 C 36.9 C    Last Pain:  Vitals:   05/26/17 0932  TempSrc:   PainSc: 10-Worst pain ever                 Epiphany Seltzer

## 2017-05-27 MED ORDER — METHOCARBAMOL 500 MG PO TABS: 500.0000 mg | ORAL_TABLET | Freq: Four times a day (QID) | ORAL | 0 refills | Status: DC | PRN

## 2017-05-27 MED ORDER — METHOCARBAMOL 500 MG PO TABS
500.0000 mg | ORAL_TABLET | Freq: Four times a day (QID) | ORAL | Status: DC | PRN
  Administered 2017-05-27: 500 mg via ORAL
  Filled 2017-05-27: qty 1

## 2017-05-27 NOTE — Progress Notes (Signed)
Occupational Therapy Treatment Patient Details Name: Marie Burns MRN: [id number] DOB: [birth year] Today's Date: 05/27/2017    History of present illness 71 y.o. female  With right shoulder arthritis with irrepairable rotator cuff tear.  Underwent RIGHT REVERSE SHOULDER ARTHROPLASTY    OT comments  Pt. Able to return demo of good technique for AROM R elbow, wrist, and hand.  Dtr. Present and also verbalized understanding.  Note d/c set for later today.    Follow Up Recommendations  Home health OT;Supervision/Assistance - 24 hour    Equipment Recommendations  3 in 1 bedside commode    Recommendations for Other Services      Precautions / Restrictions Precautions Precautions: Shoulder Type of Shoulder Precautions: No ROMR shoulder; AROM R elbow/wrist/hand Shoulder Interventions: At all times;Off for dressing/bathing/exercises Required Braces or Orthoses: Sling Restrictions Weight Bearing Restrictions: Yes RUE Weight Bearing: Non weight bearing       Mobility Bed Mobility               General bed mobility comments: Pt plans to sleep in recliner  Transfers Overall transfer level: Needs assistance Equipment used: None Transfers: Sit to/from Stand;Stand Pivot Transfers Sit to Stand: Min guard Stand pivot transfers: Min guard       General transfer comment: increased time to complete, verbal cues for sequencing, increased time to stabilize initial standing balance    Balance Overall balance assessment: Needs assistance Sitting-balance support: Feet unsupported;No upper extremity supported Sitting balance-Leahy Scale: Good     Standing balance support: Single extremity supported Standing balance-Leahy Scale: Fair                             ADL either performed or assessed with clinical judgement   ADL                                               Vision       Perception     Praxis      Cognition  Arousal/Alertness: Awake/alert Behavior During Therapy: Anxious Overall Cognitive Status: Within Functional Limits for tasks assessed                                 General Comments: Pt with mild confusion and slurring words. Probably due to receiving first dose of muscle relaxer this AM.        Exercises General Exercises - Upper Extremity Elbow Flexion: AROM Elbow Extension: AROM Shoulder Exercises Elbow Flexion: AROM;Right;10 reps;Seated Elbow Extension: AROM;Right;10 reps;Seated Wrist Flexion: AROM;Right;5 reps;Seated Wrist Extension: AROM;Right;5 reps;Seated Composite Extension: AROM;Right;10 reps;Seated   Shoulder Instructions       General Comments      Pertinent Vitals/ Pain       Pain Assessment: 0-10 Pain Score: 9  Pain Location: RUE Pain Descriptors / Indicators: Aching;Guarding;Grimacing Pain Intervention(s): Limited activity within patient's tolerance;Monitored during session;Premedicated before session  Home Living Family/patient expects to be discharged to:: Private residence Living Arrangements: Alone;Children (Daughter staying with pt through Wed, 7/18.) Available Help at Discharge: Family;Available PRN/intermittently Type of Home: Apartment Home Access: Stairs to enter Entrance Stairs-Number of Steps: 15 Entrance Stairs-Rails: Right;Left Home Layout: One level     Bathroom Shower/Tub: Corporate investment banker: Standard     Home Equipment:  None          Prior Functioning/Environment Level of Independence: Independent            Frequency  Min 3X/week        Progress Toward Goals  OT Goals(current goals can now be found in the care plan section)  Progress towards OT goals: Progressing toward goals  Acute Rehab OT Goals Patient Stated Goal: to get rid of pain  Plan Discharge plan remains appropriate    Co-evaluation                 AM-PAC PT "6 Clicks" Daily Activity     Outcome  Measure   Help from another person eating meals?: A Little Help from another person taking care of personal grooming?: A Lot Help from another person toileting, which includes using toliet, bedpan, or urinal?: A Little Help from another person bathing (including washing, rinsing, drying)?: A Lot Help from another person to put on and taking off regular upper body clothing?: A Lot Help from another person to put on and taking off regular lower body clothing?: A Little 6 Click Score: 15    End of Session    OT Visit Diagnosis: Muscle weakness (generalized) (M62.81);Pain Pain - Right/Left: Right Pain - part of body: Shoulder   Activity Tolerance Patient tolerated treatment well   Patient Left in chair;with call bell/phone within reach;with family/visitor present   Nurse Communication          Time: 2712-9290 OT Time Calculation (min): 15 min  Charges: OT General Charges $OT Visit: 1 Procedure OT Treatments $Therapeutic Exercise: 8-22 mins   Janice Coffin, COTA/L 05/27/2017, 10:36 AM

## 2017-05-27 NOTE — Evaluation (Addendum)
Physical Therapy Evaluation Patient Details Name: Marie Burns MRN: [id number] DOB: 11/15/1945 Today's Date: 05/27/2017   History of Present Illness  71 y.o. female  With right shoulder arthritis with irrepairable rotator cuff tear.  Underwent RIGHT REVERSE SHOULDER ARTHROPLASTY   Clinical Impression  PT eval complete. See below for eval details. All education complete. Pt/daughter verbalize no further questions. Pt to d/c home today with HHPT/OT. PT signing off.    Follow Up Recommendations Home health PT;Supervision/Assistance - 24 hour    Equipment Recommendations  None recommended by PT    Recommendations for Other Services       Precautions / Restrictions Precautions Precautions: Shoulder Type of Shoulder Precautions: No ROMR shoulder; AROM R elbow/wrist/hand Shoulder Interventions: At all times;Off for dressing/bathing/exercises Required Braces or Orthoses: Sling Restrictions RUE Weight Bearing: Non weight bearing      Mobility  Bed Mobility               General bed mobility comments: Pt plans to sleep in recliner  Transfers Overall transfer level: Needs assistance Equipment used: None Transfers: Sit to/from Stand;Stand Pivot Transfers Sit to Stand: Min guard Stand pivot transfers: Min guard       General transfer comment: increased time to complete, verbal cues for sequencing, increased time to stabilize initial standing balance  Ambulation/Gait Ambulation/Gait assistance: Min guard Ambulation Distance (Feet): 150 Feet Assistive device: 1 person hand held assist Gait Pattern/deviations: Step-through pattern;Decreased stride length Gait velocity: decreased Gait velocity interpretation: Below normal speed for age/gender    Stairs Stairs: Yes Stairs assistance: Min assist Stair Management: One rail Left;Forwards;Step to pattern Number of Stairs: 12 General stair comments: verbal cues for sequencing  Wheelchair Mobility    Modified  [place] (Stroke Patients Only)       Balance Overall balance assessment: Needs assistance Sitting-balance support: Feet unsupported;No upper extremity supported Sitting balance-Leahy Scale: Good     Standing balance support: Single extremity supported Standing balance-Leahy Scale: Fair                               Pertinent Vitals/Pain Pain Assessment: 0-10 Pain Score: 9  Pain Location: RUE Pain Descriptors / Indicators: Aching;Guarding;Grimacing Pain Intervention(s): Limited activity within patient's tolerance;Monitored during session;Premedicated before session    Home Living Family/patient expects to be discharged to:: Private residence Living Arrangements: Alone;Children (Daughter staying with pt through Wed, 7/18.) Available Help at Discharge: Family;Available PRN/intermittently Type of Home: Apartment Home Access: Stairs to enter Entrance Stairs-Rails: Psychiatric nurse of Steps: 15 Home Layout: One level Home Equipment: None      Prior Function Level of Independence: Independent               Hand Dominance   Dominant Hand: Left    Extremity/Trunk Assessment   Upper Extremity Assessment Upper Extremity Assessment: Defer to OT evaluation    Lower Extremity Assessment Lower Extremity Assessment: Overall WFL for tasks assessed       Communication   Communication: No difficulties  Cognition Arousal/Alertness: Awake/alert Behavior During Therapy: Anxious Overall Cognitive Status: Within Functional Limits for tasks assessed                                 General Comments: Pt with mild confusion and slurring words. Probably due to receiving first dose of muscle relaxer this AM.      General Comments  Exercises     Assessment/Plan    PT Assessment All further PT needs can be met in the next venue of care  PT Problem List Decreased balance;Decreased activity tolerance;Decreased mobility;Decreased  safety awareness;Pain       PT Treatment Interventions      PT Goals (Current goals can be found in the Care Plan section)  Acute Rehab PT Goals Patient Stated Goal: to get rid of pain PT Goal Formulation: All assessment and education complete, DC therapy    Frequency     Barriers to discharge        Co-evaluation               AM-PAC PT "6 Clicks" Daily Activity  Outcome Measure Difficulty turning over in bed (including adjusting bedclothes, sheets and blankets)?: Total Difficulty moving from lying on back to sitting on the side of the bed? : Total Difficulty sitting down on and standing up from a chair with arms (e.g., wheelchair, bedside commode, etc,.)?: A Little Help needed moving to and from a bed to chair (including a wheelchair)?: A Little Help needed walking in hospital room?: A Little Help needed climbing 3-5 steps with a railing? : A Little 6 Click Score: 14    End of Session Equipment Utilized During Treatment: Gait belt Activity Tolerance: Patient tolerated treatment well Patient left: in chair;with chair alarm set;with family/visitor present;with call bell/phone within reach Nurse Communication: Mobility status PT Visit Diagnosis: Unsteadiness on feet (R26.81);History of falling (Z91.81);Pain Pain - Right/Left: Right Pain - part of body: Shoulder    Time: 1117-3567 PT Time Calculation (min) (ACUTE ONLY): 33 min   Charges:   PT Evaluation $PT Eval Moderate Complexity: 1 Procedure PT Treatments $Gait Training: 8-22 mins   PT G Codes:        Lorrin Goodell, PT  Office # [phone] Pager [phone]   Lorriane Shire 05/27/2017, 9:50 AM

## 2017-05-27 NOTE — Discharge Summary (Signed)
Patient ID: Marie Burns MRN: [id number] DOB/AGE: 1946-01-04 71 y.o.  Admit date: 05/25/2017 Discharge date: 05/27/2017  Admission Diagnoses:  Active Problems:   S/P reverse total shoulder arthroplasty, right   Discharge Diagnoses:  Same  Past Medical History:  Diagnosis Date  . Arthritis    right shoulder  . Complication of anesthesia    hard to wake up  . History of kidney stones   . Hypertension   . Hypothyroidism   . Pneumonia    history  . RSD upper limb    left   . Skin cancer    chest    Surgeries: Procedure(s): REVERSE SHOULDER ARTHROPLASTY on 05/25/2017   Consultants:   Discharged Condition: Improved  Hospital Course: Marie Burns is an 71 y.o. female who was admitted 05/25/2017 for operative treatment of<principal problem not specified>. Patient has severe unremitting pain that affects sleep, daily activities, and work/hobbies. After pre-op clearance the patient was taken to the operating room on 05/25/2017 and underwent  Procedure(s): [place].    Patient was given perioperative antibiotics: Anti-infectives    Start     Dose/Rate Route Frequency Ordered Stop   05/25/17 1115  ceFAZolin (ANCEF) IVPB 1 g/50 mL premix     1 g 100 mL/hr over 30 Minutes Intravenous Every 6 hours 05/25/17 1101 05/26/17 0113   05/25/17 0548  ceFAZolin (ANCEF) IVPB 2g/100 mL premix     2 g 200 mL/hr over 30 Minutes Intravenous On call to O.R. 05/25/17 4196 05/25/17 0740       Patient was given sequential compression devices, early ambulation, and chemoprophylaxis to prevent DVT.  Patient benefited maximally from hospital stay and there were no complications.    Recent vital signs: Patient Vitals for the past 24 hrs:  BP Temp Temp src Pulse Resp SpO2  05/27/17 0600 (!) 101/38 99.4 F (37.4 C) Oral 84 16 96 %  05/26/17 2200 - 98.6 F (37 C) Oral - - -  05/26/17 2034 (!) 95/45 99.9 F (37.7 C) Oral 79 16 98 %     Recent laboratory studies: No  results for input(s): WBC, HGB, HCT, PLT, NA, K, CL, CO2, BUN, CREATININE, GLUCOSE, INR, CALCIUM in the last 72 hours.  Invalid input(s): PT, 2   Discharge Medications:   Allergies as of 05/27/2017      Reactions   Gabapentin Other (See Comments)   UNSPECIFIED "URINARY ISSUES"      Medication List    STOP taking these medications   acetaminophen 500 MG tablet Commonly known as:  TYLENOL   HYDROcodone-acetaminophen 7.5-325 MG tablet Commonly known as:  NORCO   meloxicam 15 MG tablet Commonly known as:  MOBIC     TAKE these medications   alendronate 70 MG tablet Commonly known as:  FOSAMAX Take 70 mg by mouth every Sunday.   cholecalciferol 1000 units tablet Commonly known as:  VITAMIN D Take 1,000 Units by mouth daily.   docusate sodium 100 MG capsule Commonly known as:  COLACE Take 1 capsule (100 mg total) by mouth 3 (three) times daily as needed.   fluticasone 50 MCG/ACT nasal spray Commonly known as:  FLONASE Place 1 spray into both nostrils daily.   hydrochlorothiazide 25 MG tablet Commonly known as:  HYDRODIURIL Take 25 mg by mouth daily at 12 noon.   L-Lysine 1000 MG Tabs Take 1,000 mg by mouth daily.   levothyroxine 100 MCG tablet Commonly known as:  SYNTHROID, LEVOTHROID Take 100 mcg by mouth daily before breakfast.  lisinopril 20 MG tablet Commonly known as:  PRINIVIL,ZESTRIL Take 20 mg by mouth daily at 12 noon.   methocarbamol 500 MG tablet Commonly known as:  ROBAXIN Take 1 tablet (500 mg total) by mouth every 6 (six) hours as needed for muscle spasms.   nystatin cream Commonly known as:  MYCOSTATIN Apply 1 application topically 2 (two) times daily as needed. For irritation/skin rash.   oxyCODONE-acetaminophen 5-325 MG tablet Commonly known as:  ROXICET Take 1-2 tablets by mouth every 4 (four) hours as needed for severe pain.   SUMAtriptan 25 MG tablet Commonly known as:  IMITREX Take 25 mg by mouth 2 (two) times daily as needed. For  migraine headaches.   traMADol 50 MG tablet Commonly known as:  ULTRAM Take 100 mg by mouth every 6 (six) hours as needed. For pain.   traZODone 100 MG tablet Commonly known as:  DESYREL Take 100 mg by mouth at bedtime as needed for sleep.   valACYclovir 500 MG tablet Commonly known as:  VALTREX Take 500 mg by mouth 2 (two) times daily as needed (for cold sore/fever blister).            Durable Medical Equipment        Start     Ordered   05/26/17 1557  For home use only DME Bedside commode  Once    Question:  Patient needs a bedside commode to treat with the following condition  Answer:  Aftercare following left shoulder joint replacement surgery   05/26/17 1603      Diagnostic Studies: Dg Chest 2 View  Result Date: 05/19/2017 CLINICAL DATA:  Preoperative exam. EXAM: CHEST  2 VIEW COMPARISON:  None. FINDINGS: Mildly enlarged cardiac silhouette. Calcific atherosclerotic disease and tortuosity of the aorta. Mediastinal contours appear intact. There is no evidence of focal airspace consolidation, pleural effusion or pneumothorax. Osseous structures are without acute abnormality. Mild multilevel osteoarthritic changes of the thoracic spine and exaggerated thoracic kyphosis. Soft tissues are grossly normal. IMPRESSION: Mildly enlarged cardiac silhouette and calcific atherosclerotic disease of the aorta. Electronically Signed   By: Fidela Salisbury M.D.   On: 05/19/2017 16:15   Dg Shoulder Right Port  Result Date: 05/25/2017 CLINICAL DATA:  Post RIGHT reverse shoulder arthroplasty EXAM: PORTABLE RIGHT SHOULDER COMPARISON:  Portable exam 1012 hours without priors for comparison FINDINGS: Diffuse osseous demineralization. Reverse RIGHT shoulder prosthesis without acute fracture or dislocation. AC joint alignment normal. IMPRESSION: Reverse RIGHT shoulder prosthesis without acute bony abnormalities. Electronically Signed   By: Lavonia Dana M.D.   On: 05/25/2017 10:38    Disposition:  01-Home or Self Care  Discharge Instructions    Call MD / Call 911    Complete by:  As directed    If you experience chest pain or shortness of breath, CALL 911 and be transported to the hospital emergency room.  If you develope a fever above 101 F, pus (white drainage) or increased drainage or redness at the wound, or calf pain, call your surgeon's office.   Call MD / Call 911    Complete by:  As directed    If you experience chest pain or shortness of breath, CALL 911 and be transported to the hospital emergency room.  If you develope a fever above 101 F, pus (white drainage) or increased drainage or redness at the wound, or calf pain, call your surgeon's office.   Constipation Prevention    Complete by:  As directed    Drink plenty of fluids.  Prune juice may be helpful.  You may use a stool softener, such as Colace (over the counter) 100 mg twice a day.  Use MiraLax (over the counter) for constipation as needed.   Constipation Prevention    Complete by:  As directed    Drink plenty of fluids.  Prune juice may be helpful.  You may use a stool softener, such as Colace (over the counter) 100 mg twice a day.  Use MiraLax (over the counter) for constipation as needed.   Diet - low sodium heart healthy    Complete by:  As directed    Diet - low sodium heart healthy    Complete by:  As directed    Increase activity slowly as tolerated    Complete by:  As directed    Increase activity slowly as tolerated    Complete by:  As directed       Follow-up Information    Tania Ade, MD. Schedule an appointment as soon as possible for a visit on 06/08/2017.   Specialty:  Orthopedic Surgery Contact information: [address] [place] [address] [phone]        [facility] Follow up.   Specialty:  [place] Why:  A representative from [facility] will contact you to arrange start date and time for your therapy. Contact information: [address] [place] [address] [phone]            Signed: Rich Fuchs 05/27/2017, 7:46 AM

## 2017-05-27 NOTE — Progress Notes (Signed)
Subjective: 2 Days Post-Op Procedure(s) (LRB): REVERSE SHOULDER ARTHROPLASTY (Right)   Patient is sitting up in bedside chair. She is nervous about PT this morning and them flaring up her pain prior to discharge. She is also asking about a muiscle relaxer.   Activity level:  PT of hand, wirst, elbow only. Diet tolerance:  ok Voiding:  ok Patient reports pain as mild and moderate.    Objective: Vital signs in last 24 hours: Temp:  [98.6 F (37 C)-99.9 F (37.7 C)] 99.4 F (37.4 C) (07/14 0600) Pulse Rate:  [79-84] 84 (07/14 0600) Resp:  [16] 16 (07/14 0600) BP: (95-101)/(38-45) 101/38 (07/14 0600) SpO2:  [96 %-98 %] 96 % (07/14 0600)  Labs: No results for input(s): HGB in the last 72 hours. No results for input(s): WBC, RBC, HCT, PLT in the last 72 hours. No results for input(s): NA, K, CL, CO2, BUN, CREATININE, GLUCOSE, CALCIUM in the last 72 hours. No results for input(s): LABPT, INR in the last 72 hours.  Physical Exam:  Neurologically intact ABD soft Neurovascular intact Sensation intact distally Intact pulses distally Dorsiflexion/Plantar flexion intact Incision: dressing C/D/I and no drainage No cellulitis present Compartment soft  Assessment/Plan:  2 Days Post-Op Procedure(s) (LRB): REVERSE SHOULDER ARTHROPLASTY (Right) Advance diet Up with therapy Discharge home with home health today after PT. I added Robaxin to help with pain control. Follow up in office 2 weeks post op. Continue current pain meds. Cautioned about oversedation.  Lukah Goswami, Larwance Sachs 05/27/2017, 7:43 AM

## 2017-05-30 DIAGNOSIS — Z9181 History of falling: Secondary | ICD-10-CM | POA: Diagnosis not present

## 2017-05-30 DIAGNOSIS — Z471 Aftercare following joint replacement surgery: Secondary | ICD-10-CM | POA: Diagnosis not present

## 2017-05-30 DIAGNOSIS — Z96611 Presence of right artificial shoulder joint: Secondary | ICD-10-CM | POA: Diagnosis not present

## 2017-05-30 DIAGNOSIS — I10 Essential (primary) hypertension: Secondary | ICD-10-CM | POA: Diagnosis not present

## 2017-06-01 DIAGNOSIS — Z471 Aftercare following joint replacement surgery: Secondary | ICD-10-CM | POA: Diagnosis not present

## 2017-06-01 DIAGNOSIS — Z96611 Presence of right artificial shoulder joint: Secondary | ICD-10-CM | POA: Diagnosis not present

## 2017-06-01 DIAGNOSIS — Z9181 History of falling: Secondary | ICD-10-CM | POA: Diagnosis not present

## 2017-06-01 DIAGNOSIS — I10 Essential (primary) hypertension: Secondary | ICD-10-CM | POA: Diagnosis not present

## 2017-06-05 DIAGNOSIS — Z471 Aftercare following joint replacement surgery: Secondary | ICD-10-CM | POA: Diagnosis not present

## 2017-06-05 DIAGNOSIS — Z9181 History of falling: Secondary | ICD-10-CM | POA: Diagnosis not present

## 2017-06-05 DIAGNOSIS — Z96611 Presence of right artificial shoulder joint: Secondary | ICD-10-CM | POA: Diagnosis not present

## 2017-06-05 DIAGNOSIS — I10 Essential (primary) hypertension: Secondary | ICD-10-CM | POA: Diagnosis not present

## 2017-06-08 DIAGNOSIS — Z96611 Presence of right artificial shoulder joint: Secondary | ICD-10-CM | POA: Diagnosis not present

## 2017-06-08 DIAGNOSIS — Z9181 History of falling: Secondary | ICD-10-CM | POA: Diagnosis not present

## 2017-06-08 DIAGNOSIS — I10 Essential (primary) hypertension: Secondary | ICD-10-CM | POA: Diagnosis not present

## 2017-06-08 DIAGNOSIS — Z471 Aftercare following joint replacement surgery: Secondary | ICD-10-CM | POA: Diagnosis not present

## 2017-06-12 DIAGNOSIS — M19011 Primary osteoarthritis, right shoulder: Secondary | ICD-10-CM | POA: Diagnosis not present

## 2017-06-12 DIAGNOSIS — Z471 Aftercare following joint replacement surgery: Secondary | ICD-10-CM | POA: Diagnosis not present

## 2017-06-12 DIAGNOSIS — Z96611 Presence of right artificial shoulder joint: Secondary | ICD-10-CM | POA: Diagnosis not present

## 2017-06-14 DIAGNOSIS — M25611 Stiffness of right shoulder, not elsewhere classified: Secondary | ICD-10-CM | POA: Diagnosis not present

## 2017-06-14 DIAGNOSIS — M25511 Pain in right shoulder: Secondary | ICD-10-CM | POA: Diagnosis not present

## 2017-06-14 DIAGNOSIS — Z96611 Presence of right artificial shoulder joint: Secondary | ICD-10-CM | POA: Diagnosis not present

## 2017-06-22 DIAGNOSIS — M25511 Pain in right shoulder: Secondary | ICD-10-CM | POA: Diagnosis not present

## 2017-06-22 DIAGNOSIS — M25611 Stiffness of right shoulder, not elsewhere classified: Secondary | ICD-10-CM | POA: Diagnosis not present

## 2017-06-22 DIAGNOSIS — Z96611 Presence of right artificial shoulder joint: Secondary | ICD-10-CM | POA: Diagnosis not present

## 2017-06-27 DIAGNOSIS — M25611 Stiffness of right shoulder, not elsewhere classified: Secondary | ICD-10-CM | POA: Diagnosis not present

## 2017-06-27 DIAGNOSIS — Z96611 Presence of right artificial shoulder joint: Secondary | ICD-10-CM | POA: Diagnosis not present

## 2017-06-27 DIAGNOSIS — M25511 Pain in right shoulder: Secondary | ICD-10-CM | POA: Diagnosis not present

## 2017-07-06 DIAGNOSIS — M25611 Stiffness of right shoulder, not elsewhere classified: Secondary | ICD-10-CM | POA: Diagnosis not present

## 2017-07-06 DIAGNOSIS — M25511 Pain in right shoulder: Secondary | ICD-10-CM | POA: Diagnosis not present

## 2017-07-06 DIAGNOSIS — Z96611 Presence of right artificial shoulder joint: Secondary | ICD-10-CM | POA: Diagnosis not present

## 2017-07-12 DIAGNOSIS — M25511 Pain in right shoulder: Secondary | ICD-10-CM | POA: Diagnosis not present

## 2017-07-12 DIAGNOSIS — M25611 Stiffness of right shoulder, not elsewhere classified: Secondary | ICD-10-CM | POA: Diagnosis not present

## 2017-07-12 DIAGNOSIS — Z471 Aftercare following joint replacement surgery: Secondary | ICD-10-CM | POA: Diagnosis not present

## 2017-07-12 DIAGNOSIS — Z96611 Presence of right artificial shoulder joint: Secondary | ICD-10-CM | POA: Diagnosis not present

## 2017-07-12 DIAGNOSIS — Z96612 Presence of left artificial shoulder joint: Secondary | ICD-10-CM | POA: Diagnosis not present

## 2017-07-24 DIAGNOSIS — M25611 Stiffness of right shoulder, not elsewhere classified: Secondary | ICD-10-CM | POA: Diagnosis not present

## 2017-07-24 DIAGNOSIS — Z96611 Presence of right artificial shoulder joint: Secondary | ICD-10-CM | POA: Diagnosis not present

## 2017-07-24 DIAGNOSIS — M25511 Pain in right shoulder: Secondary | ICD-10-CM | POA: Diagnosis not present

## 2017-07-26 DIAGNOSIS — M25611 Stiffness of right shoulder, not elsewhere classified: Secondary | ICD-10-CM | POA: Diagnosis not present

## 2017-07-26 DIAGNOSIS — Z96611 Presence of right artificial shoulder joint: Secondary | ICD-10-CM | POA: Diagnosis not present

## 2017-07-26 DIAGNOSIS — M25511 Pain in right shoulder: Secondary | ICD-10-CM | POA: Diagnosis not present

## 2017-08-02 DIAGNOSIS — Z96611 Presence of right artificial shoulder joint: Secondary | ICD-10-CM | POA: Diagnosis not present

## 2017-08-02 DIAGNOSIS — M25511 Pain in right shoulder: Secondary | ICD-10-CM | POA: Diagnosis not present

## 2017-08-02 DIAGNOSIS — M25611 Stiffness of right shoulder, not elsewhere classified: Secondary | ICD-10-CM | POA: Diagnosis not present

## 2017-08-10 DIAGNOSIS — M25511 Pain in right shoulder: Secondary | ICD-10-CM | POA: Diagnosis not present

## 2017-08-10 DIAGNOSIS — Z96611 Presence of right artificial shoulder joint: Secondary | ICD-10-CM | POA: Diagnosis not present

## 2017-08-10 DIAGNOSIS — M25611 Stiffness of right shoulder, not elsewhere classified: Secondary | ICD-10-CM | POA: Diagnosis not present

## 2017-08-14 DIAGNOSIS — M25611 Stiffness of right shoulder, not elsewhere classified: Secondary | ICD-10-CM | POA: Diagnosis not present

## 2017-08-14 DIAGNOSIS — Z96611 Presence of right artificial shoulder joint: Secondary | ICD-10-CM | POA: Diagnosis not present

## 2017-08-14 DIAGNOSIS — M25511 Pain in right shoulder: Secondary | ICD-10-CM | POA: Diagnosis not present

## 2017-08-16 DIAGNOSIS — Z471 Aftercare following joint replacement surgery: Secondary | ICD-10-CM | POA: Diagnosis not present

## 2017-08-16 DIAGNOSIS — Z96611 Presence of right artificial shoulder joint: Secondary | ICD-10-CM | POA: Diagnosis not present

## 2017-08-16 DIAGNOSIS — M25511 Pain in right shoulder: Secondary | ICD-10-CM | POA: Diagnosis not present

## 2017-08-16 DIAGNOSIS — M25611 Stiffness of right shoulder, not elsewhere classified: Secondary | ICD-10-CM | POA: Diagnosis not present

## 2017-08-23 DIAGNOSIS — E538 Deficiency of other specified B group vitamins: Secondary | ICD-10-CM | POA: Diagnosis not present

## 2017-08-23 DIAGNOSIS — G8929 Other chronic pain: Secondary | ICD-10-CM | POA: Diagnosis not present

## 2017-08-23 DIAGNOSIS — I10 Essential (primary) hypertension: Secondary | ICD-10-CM | POA: Diagnosis not present

## 2017-08-23 DIAGNOSIS — E785 Hyperlipidemia, unspecified: Secondary | ICD-10-CM | POA: Diagnosis not present

## 2017-08-23 DIAGNOSIS — E559 Vitamin D deficiency, unspecified: Secondary | ICD-10-CM | POA: Diagnosis not present

## 2017-08-23 DIAGNOSIS — R739 Hyperglycemia, unspecified: Secondary | ICD-10-CM | POA: Diagnosis not present

## 2017-08-23 DIAGNOSIS — E039 Hypothyroidism, unspecified: Secondary | ICD-10-CM | POA: Diagnosis not present

## 2017-08-23 DIAGNOSIS — Z23 Encounter for immunization: Secondary | ICD-10-CM | POA: Diagnosis not present

## 2017-08-24 DIAGNOSIS — M25611 Stiffness of right shoulder, not elsewhere classified: Secondary | ICD-10-CM | POA: Diagnosis not present

## 2017-08-24 DIAGNOSIS — M25511 Pain in right shoulder: Secondary | ICD-10-CM | POA: Diagnosis not present

## 2017-08-24 DIAGNOSIS — Z96611 Presence of right artificial shoulder joint: Secondary | ICD-10-CM | POA: Diagnosis not present

## 2017-09-01 DIAGNOSIS — M25611 Stiffness of right shoulder, not elsewhere classified: Secondary | ICD-10-CM | POA: Diagnosis not present

## 2017-09-01 DIAGNOSIS — M25511 Pain in right shoulder: Secondary | ICD-10-CM | POA: Diagnosis not present

## 2017-09-01 DIAGNOSIS — Z96611 Presence of right artificial shoulder joint: Secondary | ICD-10-CM | POA: Diagnosis not present

## 2017-09-07 DIAGNOSIS — M25511 Pain in right shoulder: Secondary | ICD-10-CM | POA: Diagnosis not present

## 2017-09-07 DIAGNOSIS — Z96611 Presence of right artificial shoulder joint: Secondary | ICD-10-CM | POA: Diagnosis not present

## 2017-09-07 DIAGNOSIS — M25611 Stiffness of right shoulder, not elsewhere classified: Secondary | ICD-10-CM | POA: Diagnosis not present

## 2017-09-14 DIAGNOSIS — M25511 Pain in right shoulder: Secondary | ICD-10-CM | POA: Diagnosis not present

## 2017-09-14 DIAGNOSIS — M25611 Stiffness of right shoulder, not elsewhere classified: Secondary | ICD-10-CM | POA: Diagnosis not present

## 2017-09-14 DIAGNOSIS — Z96611 Presence of right artificial shoulder joint: Secondary | ICD-10-CM | POA: Diagnosis not present

## 2017-09-18 DIAGNOSIS — M25611 Stiffness of right shoulder, not elsewhere classified: Secondary | ICD-10-CM | POA: Diagnosis not present

## 2017-09-18 DIAGNOSIS — Z96611 Presence of right artificial shoulder joint: Secondary | ICD-10-CM | POA: Diagnosis not present

## 2017-09-18 DIAGNOSIS — M25511 Pain in right shoulder: Secondary | ICD-10-CM | POA: Diagnosis not present

## 2017-09-28 DIAGNOSIS — M25611 Stiffness of right shoulder, not elsewhere classified: Secondary | ICD-10-CM | POA: Diagnosis not present

## 2017-09-28 DIAGNOSIS — Z96611 Presence of right artificial shoulder joint: Secondary | ICD-10-CM | POA: Diagnosis not present

## 2017-09-28 DIAGNOSIS — M25511 Pain in right shoulder: Secondary | ICD-10-CM | POA: Diagnosis not present

## 2017-10-03 DIAGNOSIS — Z96611 Presence of right artificial shoulder joint: Secondary | ICD-10-CM | POA: Diagnosis not present

## 2017-10-03 DIAGNOSIS — M25611 Stiffness of right shoulder, not elsewhere classified: Secondary | ICD-10-CM | POA: Diagnosis not present

## 2017-10-03 DIAGNOSIS — M25511 Pain in right shoulder: Secondary | ICD-10-CM | POA: Diagnosis not present

## 2017-10-10 DIAGNOSIS — M25611 Stiffness of right shoulder, not elsewhere classified: Secondary | ICD-10-CM | POA: Diagnosis not present

## 2017-10-10 DIAGNOSIS — Z96611 Presence of right artificial shoulder joint: Secondary | ICD-10-CM | POA: Diagnosis not present

## 2017-10-10 DIAGNOSIS — M25511 Pain in right shoulder: Secondary | ICD-10-CM | POA: Diagnosis not present

## 2017-12-13 DIAGNOSIS — Z96611 Presence of right artificial shoulder joint: Secondary | ICD-10-CM | POA: Diagnosis not present

## 2017-12-13 DIAGNOSIS — Z471 Aftercare following joint replacement surgery: Secondary | ICD-10-CM | POA: Diagnosis not present

## 2018-04-11 DIAGNOSIS — E785 Hyperlipidemia, unspecified: Secondary | ICD-10-CM | POA: Diagnosis not present

## 2018-04-11 DIAGNOSIS — I10 Essential (primary) hypertension: Secondary | ICD-10-CM | POA: Diagnosis not present

## 2018-04-11 DIAGNOSIS — E559 Vitamin D deficiency, unspecified: Secondary | ICD-10-CM | POA: Diagnosis not present

## 2018-04-11 DIAGNOSIS — E039 Hypothyroidism, unspecified: Secondary | ICD-10-CM | POA: Diagnosis not present

## 2018-04-11 DIAGNOSIS — E538 Deficiency of other specified B group vitamins: Secondary | ICD-10-CM | POA: Diagnosis not present

## 2018-04-11 DIAGNOSIS — G8929 Other chronic pain: Secondary | ICD-10-CM | POA: Diagnosis not present

## 2018-08-09 DIAGNOSIS — M545 Low back pain: Secondary | ICD-10-CM | POA: Diagnosis not present

## 2018-09-24 DIAGNOSIS — E538 Deficiency of other specified B group vitamins: Secondary | ICD-10-CM | POA: Diagnosis not present

## 2018-09-24 DIAGNOSIS — I10 Essential (primary) hypertension: Secondary | ICD-10-CM | POA: Diagnosis not present

## 2018-09-24 DIAGNOSIS — E039 Hypothyroidism, unspecified: Secondary | ICD-10-CM | POA: Diagnosis not present

## 2018-09-24 DIAGNOSIS — E785 Hyperlipidemia, unspecified: Secondary | ICD-10-CM | POA: Diagnosis not present

## 2018-09-24 DIAGNOSIS — G8929 Other chronic pain: Secondary | ICD-10-CM | POA: Diagnosis not present

## 2018-09-24 DIAGNOSIS — Z23 Encounter for immunization: Secondary | ICD-10-CM | POA: Diagnosis not present

## 2018-09-24 DIAGNOSIS — M48061 Spinal stenosis, lumbar region without neurogenic claudication: Secondary | ICD-10-CM | POA: Diagnosis not present

## 2018-09-24 DIAGNOSIS — E559 Vitamin D deficiency, unspecified: Secondary | ICD-10-CM | POA: Diagnosis not present

## 2018-12-12 DIAGNOSIS — E785 Hyperlipidemia, unspecified: Secondary | ICD-10-CM | POA: Diagnosis not present

## 2018-12-12 DIAGNOSIS — Z23 Encounter for immunization: Secondary | ICD-10-CM | POA: Diagnosis not present

## 2018-12-12 DIAGNOSIS — G8929 Other chronic pain: Secondary | ICD-10-CM | POA: Diagnosis not present

## 2018-12-12 DIAGNOSIS — Z0000 Encounter for general adult medical examination without abnormal findings: Secondary | ICD-10-CM | POA: Diagnosis not present

## 2018-12-12 DIAGNOSIS — E039 Hypothyroidism, unspecified: Secondary | ICD-10-CM | POA: Diagnosis not present

## 2018-12-12 DIAGNOSIS — M48061 Spinal stenosis, lumbar region without neurogenic claudication: Secondary | ICD-10-CM | POA: Diagnosis not present

## 2018-12-12 DIAGNOSIS — M810 Age-related osteoporosis without current pathological fracture: Secondary | ICD-10-CM | POA: Diagnosis not present

## 2018-12-12 DIAGNOSIS — R739 Hyperglycemia, unspecified: Secondary | ICD-10-CM | POA: Diagnosis not present

## 2018-12-12 DIAGNOSIS — E559 Vitamin D deficiency, unspecified: Secondary | ICD-10-CM | POA: Diagnosis not present

## 2018-12-12 DIAGNOSIS — I10 Essential (primary) hypertension: Secondary | ICD-10-CM | POA: Diagnosis not present

## 2018-12-12 DIAGNOSIS — Z1211 Encounter for screening for malignant neoplasm of colon: Secondary | ICD-10-CM | POA: Diagnosis not present

## 2018-12-12 DIAGNOSIS — E538 Deficiency of other specified B group vitamins: Secondary | ICD-10-CM | POA: Diagnosis not present

## 2019-06-14 DIAGNOSIS — M5430 Sciatica, unspecified side: Secondary | ICD-10-CM | POA: Diagnosis not present

## 2019-06-21 DIAGNOSIS — M5430 Sciatica, unspecified side: Secondary | ICD-10-CM | POA: Diagnosis not present

## 2019-06-26 ENCOUNTER — Other Ambulatory Visit: Payer: Self-pay | Admitting: Family Medicine

## 2019-06-26 DIAGNOSIS — M48061 Spinal stenosis, lumbar region without neurogenic claudication: Secondary | ICD-10-CM

## 2019-07-24 ENCOUNTER — Ambulatory Visit
Admission: RE | Admit: 2019-07-24 | Discharge: 2019-07-24 | Disposition: A | Discharge status: Home / Self Care | Payer: Medicare Other | Source: Ambulatory Visit | Attending: Family Medicine | Admitting: Family Medicine

## 2019-07-24 DIAGNOSIS — M48061 Spinal stenosis, lumbar region without neurogenic claudication: Secondary | ICD-10-CM | POA: Diagnosis not present

## 2019-07-24 DIAGNOSIS — M4316 Spondylolisthesis, lumbar region: Secondary | ICD-10-CM | POA: Diagnosis not present

## 2019-07-24 DIAGNOSIS — M47816 Spondylosis without myelopathy or radiculopathy, lumbar region: Secondary | ICD-10-CM | POA: Diagnosis not present

## 2019-08-12 DIAGNOSIS — Z23 Encounter for immunization: Secondary | ICD-10-CM | POA: Diagnosis not present

## 2019-08-21 DIAGNOSIS — H6980 Other specified disorders of Eustachian tube, unspecified ear: Secondary | ICD-10-CM | POA: Diagnosis not present

## 2019-08-21 DIAGNOSIS — M26622 Arthralgia of left temporomandibular joint: Secondary | ICD-10-CM | POA: Diagnosis not present

## 2019-09-05 DIAGNOSIS — H9202 Otalgia, left ear: Secondary | ICD-10-CM | POA: Diagnosis not present

## 2020-01-07 DIAGNOSIS — E785 Hyperlipidemia, unspecified: Secondary | ICD-10-CM | POA: Diagnosis not present

## 2020-01-07 DIAGNOSIS — G9050 Complex regional pain syndrome I, unspecified: Secondary | ICD-10-CM | POA: Diagnosis not present

## 2020-01-07 DIAGNOSIS — G8929 Other chronic pain: Secondary | ICD-10-CM | POA: Diagnosis not present

## 2020-01-07 DIAGNOSIS — M810 Age-related osteoporosis without current pathological fracture: Secondary | ICD-10-CM | POA: Diagnosis not present

## 2020-01-07 DIAGNOSIS — M1990 Unspecified osteoarthritis, unspecified site: Secondary | ICD-10-CM | POA: Diagnosis not present

## 2020-01-07 DIAGNOSIS — E559 Vitamin D deficiency, unspecified: Secondary | ICD-10-CM | POA: Diagnosis not present

## 2020-01-07 DIAGNOSIS — E538 Deficiency of other specified B group vitamins: Secondary | ICD-10-CM | POA: Diagnosis not present

## 2020-01-07 DIAGNOSIS — I10 Essential (primary) hypertension: Secondary | ICD-10-CM | POA: Diagnosis not present

## 2020-01-07 DIAGNOSIS — M67919 Unspecified disorder of synovium and tendon, unspecified shoulder: Secondary | ICD-10-CM | POA: Diagnosis not present

## 2020-01-07 DIAGNOSIS — E039 Hypothyroidism, unspecified: Secondary | ICD-10-CM | POA: Diagnosis not present

## 2020-01-07 DIAGNOSIS — Z0000 Encounter for general adult medical examination without abnormal findings: Secondary | ICD-10-CM | POA: Diagnosis not present

## 2020-01-07 DIAGNOSIS — R739 Hyperglycemia, unspecified: Secondary | ICD-10-CM | POA: Diagnosis not present

## 2020-01-15 ENCOUNTER — Encounter: Payer: Self-pay | Admitting: Physical Medicine and Rehabilitation

## 2020-02-03 ENCOUNTER — Other Ambulatory Visit: Payer: Self-pay

## 2020-02-03 ENCOUNTER — Encounter: Payer: Self-pay | Admitting: Physical Medicine and Rehabilitation

## 2020-02-03 ENCOUNTER — Encounter
Payer: Medicare Other | Attending: Physical Medicine and Rehabilitation | Admitting: Physical Medicine and Rehabilitation

## 2020-02-03 VITALS — BP 132/78 | HR 87 | Temp 97.0°F | Ht 60.0 in | Wt 135.0 lb

## 2020-02-03 DIAGNOSIS — G894 Chronic pain syndrome: Secondary | ICD-10-CM | POA: Insufficient documentation

## 2020-02-03 DIAGNOSIS — Z5181 Encounter for therapeutic drug level monitoring: Secondary | ICD-10-CM | POA: Diagnosis not present

## 2020-02-03 DIAGNOSIS — M48062 Spinal stenosis, lumbar region with neurogenic claudication: Secondary | ICD-10-CM | POA: Insufficient documentation

## 2020-02-03 NOTE — Progress Notes (Signed)
Subjective:    Patient ID: Marie Burns, female    DOB: October 28, 1946, 74 y.o.   MRN: [id number]  HPI  Marie Burns is a 74 year old woman who presents with lower back pain, worse on the left side, that radiates posteriorly into her left leg. She has had this pain for 15 years.  Prior medications she has tried: Hydromorphone 4mg , Norco 7.5, Tramadol 100 q6H, Lyrica 150, Meloxicam 15, Amitriptyline, Cymbalta. She has allergy to Gabapentin. She has never tried [employer]. She is currently taking Tramadol 100mg  q6H from which she does not get much relief.   She has had a Lumbar MRI in 2020, personally reviewed and discussed with patient, that shows severe spinal stenosis. Her PCP has recommended that she have surgery but she does not want to pursue this treatment option. She has had an epidural steroid injection without relief.   She is severely limited by her pain. Because she lives on the second floor she is unable to do much community ambulation. Her PCP has written a note to her landlord asking for her to be moved to a first floor apartment. She also sleeps very poorly at night due to the pain.   Pain Inventory Average Pain 10 Pain Right Now 3 My pain is sharp, burning, stabbing, tingling and aching  In the last 24 hours, has pain interfered with the following? General activity 7 Relation with others 10 Enjoyment of life 10 What TIME of day is your pain at its worst? all Sleep (in general) Poor  Pain is worse with: walking and bending Pain improves with: medication Relief from Meds: 3  Mobility walk without assistance use a cane  Function disabled: date disabled .,  Neuro/Psych numbness tingling trouble walking spasms anxiety  Prior Studies Any changes since last visit?  no  Physicians involved in your care Any changes since last visit?  no   No family history on file. Social History   Socioeconomic History  . Marital status: Divorced    Spouse name: Not on  file  . Number of children: Not on file  . Years of education: Not on file  . Highest education level: Not on file  Occupational History  . Not on file  Tobacco Use  . Smoking status: Never Smoker  . Smokeless tobacco: Never Used  Substance and Sexual Activity  . Alcohol use: No  . Drug use: No  . Sexual activity: Not on file  Other Topics Concern  . Not on file  Social History Narrative  . Not on file   Social Determinants of Health   Financial Resource Strain:   . Difficulty of Paying Living Expenses:   Food Insecurity:   . Worried About Charity fundraiser in the Last Year:   . Arboriculturist in the Last Year:   Transportation Needs:   . Film/video editor (Medical):   Marland Kitchen Lack of Transportation (Non-Medical):   Physical Activity:   . Days of Exercise per Week:   . Minutes of Exercise per Session:   Stress:   . Feeling of Stress :   Social Connections:   . Frequency of Communication with Friends and Family:   . Frequency of Social Gatherings with Friends and Family:   . Attends Religious Services:   . Active Member of Clubs or Organizations:   . Attends Archivist Meetings:   Marland Kitchen Marital Status:    Past Surgical History:  Procedure Laterality Date  . APPENDECTOMY    .  BREAST SURGERY Left    lymphnodes removed in the left arm X2  . REVERSE SHOULDER ARTHROPLASTY Right 05/25/2017  . REVERSE SHOULDER ARTHROPLASTY Right 05/25/2017   Procedure: REVERSE SHOULDER ARTHROPLASTY;  Surgeon: Tania Ade, MD;  Location: [facility];  Service: Orthopedics;  Laterality: Right;  RIGHT REVERSE TOTAL SHOULDER ARTHROPLASTY   Past Medical History:  Diagnosis Date  . Arthritis    right shoulder  . Complication of anesthesia    hard to wake up  . History of kidney stones   . Hypertension   . Hypothyroidism   . Pneumonia    history  . RSD upper limb    left   . Skin cancer    chest   BP 132/78   Pulse 87   Temp (!) 97 F (36.1 C)   Ht 5' (1.524 m)   Wt 135  lb (61.2 kg)   SpO2 97%   BMI 26.37 kg/m   Opioid Risk Score:   Fall Risk Score:  `1  Depression screen PHQ 2/9  No flowsheet data found. \  Review of Systems  Constitutional: Positive for unexpected weight change.  HENT: Negative.   Eyes: Negative.   Respiratory: Negative.   Cardiovascular: Negative.   Gastrointestinal: Negative.   Endocrine: Negative.   Genitourinary: Negative.   Musculoskeletal: Positive for arthralgias, back pain and myalgias.  Skin: Negative.   Allergic/Immunologic: Negative.   Neurological: Positive for numbness.  Hematological: Negative.   Psychiatric/Behavioral: The patient is nervous/anxious.   All other systems reviewed and are negative.      Objective:   Physical Exam Gen: no distress, normal appearing HEENT: oral mucosa pink and moist, NCAT Cardio: Reg rate Chest: normal effort, normal rate of breathing Abd: soft, non-distended Ext: no edema Skin: intact Neuro: AOx3.  Musculoskeletal: Antalgic gait. Unable to perform heel to toe walk. + slump test on left. Tenderness to palpation along left sided paraspinals.  Psych: pleasant, normal affect    Assessment & Plan:  Marie Burns is a 74 year old woman who presents with lower back pain, worse on the left side, that radiates posteriorly into her left leg. She has had this pain for 15 years.  Pain is secondary to left sided stenosis with neurogenic claudication of S1 nerve root --MRI reviewed with patient and is consistent with this diagnosis --She defers surgical intervention at this time --She has had epidural steroid injection without any benefit.  --She has not find relief from all medications discussed in H&P. --Urine drug screen ordered and pain contract signed today. Discussed trial of Belbuca based on results. I will call patient to discuss UDS results in one week.  All questions answered. RTC in 1 month.

## 2020-02-06 LAB — TOXASSURE SELECT,+ANTIDEPR,UR

## 2020-02-11 ENCOUNTER — Telehealth: Payer: Self-pay

## 2020-02-11 NOTE — Telephone Encounter (Signed)
UDS results NOT consistent with medications on file - Please review

## 2020-02-11 NOTE — Telephone Encounter (Signed)
I have called patient to discuss this. She will repeat sample before any narcotic prescriptions from [place].

## 2020-02-20 ENCOUNTER — Other Ambulatory Visit: Payer: Self-pay | Admitting: Physical Medicine and Rehabilitation

## 2020-02-20 ENCOUNTER — Telehealth: Payer: Self-pay | Admitting: *Deleted

## 2020-02-20 DIAGNOSIS — Z5181 Encounter for therapeutic drug level monitoring: Secondary | ICD-10-CM

## 2020-02-20 NOTE — Telephone Encounter (Signed)
Ms Romans called and does not remember the number given to her by Dr Ranell Patrick to speak with her. She would like a call back from Dr. Ranell Patrick. Her number is [phone].

## 2020-02-21 DIAGNOSIS — Z5181 Encounter for therapeutic drug level monitoring: Secondary | ICD-10-CM | POA: Diagnosis not present

## 2020-02-24 LAB — TOXASSURE SELECT 13 (MW), URINE

## 2020-02-24 NOTE — Telephone Encounter (Signed)
Ms Rosen called back requesting to speak with Dr Ranell Patrick.  Ph # [phone].

## 2020-02-25 ENCOUNTER — Other Ambulatory Visit: Payer: Self-pay | Admitting: Physical Medicine and Rehabilitation

## 2020-02-25 MED ORDER — TRAMADOL HCL 50 MG PO TABS: 100.0000 mg | ORAL_TABLET | Freq: Four times a day (QID) | ORAL | 0 refills | Status: DC | PRN

## 2020-02-26 ENCOUNTER — Telehealth: Payer: Self-pay | Admitting: *Deleted

## 2020-02-26 NOTE — Telephone Encounter (Signed)
Urine drug screen was positive for Tramadol and its metabolites. This is consistent with what she has been prescribed.

## 2020-03-03 ENCOUNTER — Other Ambulatory Visit: Payer: Self-pay

## 2020-03-03 ENCOUNTER — Encounter
Payer: Medicare Other | Attending: Physical Medicine and Rehabilitation | Admitting: Physical Medicine and Rehabilitation

## 2020-03-03 ENCOUNTER — Encounter: Payer: Self-pay | Admitting: Physical Medicine and Rehabilitation

## 2020-03-03 VITALS — BP 155/85 | HR 86 | Temp 97.7°F | Ht <= 58 in | Wt 134.0 lb

## 2020-03-03 DIAGNOSIS — Z5181 Encounter for therapeutic drug level monitoring: Secondary | ICD-10-CM | POA: Diagnosis not present

## 2020-03-03 DIAGNOSIS — G4701 Insomnia due to medical condition: Secondary | ICD-10-CM

## 2020-03-03 DIAGNOSIS — M48062 Spinal stenosis, lumbar region with neurogenic claudication: Secondary | ICD-10-CM | POA: Insufficient documentation

## 2020-03-03 DIAGNOSIS — M25512 Pain in left shoulder: Secondary | ICD-10-CM | POA: Diagnosis not present

## 2020-03-03 DIAGNOSIS — G8929 Other chronic pain: Secondary | ICD-10-CM | POA: Diagnosis not present

## 2020-03-03 DIAGNOSIS — G894 Chronic pain syndrome: Secondary | ICD-10-CM | POA: Diagnosis not present

## 2020-03-03 MED ORDER — OXYCODONE-ACETAMINOPHEN 7.5-325 MG PO TABS: 1.0000 | ORAL_TABLET | ORAL | 0 refills | Status: DC | PRN

## 2020-03-03 NOTE — Progress Notes (Signed)
Subjective:    Patient ID: Marie Burns, female    DOB: 1945/12/30, 74 y.o.   MRN: [id number]  HPI Marie Burns is a 74 year old woman who presents for follow-up of lower back pain, worse on the left side, that radiates posteriorly into her left leg. She has had this pain for 15 years.  Prior medications she has tried: Hydromorphone 4mg , Norco 7.5, Tramadol 100 q6H, Lyrica 150, Meloxicam 15, Amitriptyline, Cymbalta. She has allergy to Gabapentin. She has never tried [employer]. She is currently taking Tramadol 100mg  q6H from which she does not get any relief. She did not get relief from the former medications either.   She has had a Lumbar MRI in 2020, personally reviewed and discussed with patient, that shows severe spinal stenosis. Her PCP has recommended that she have surgery but she does not want to pursue this treatment option. She has had an epidural steroid injection without relief.   She is severely limited by her pain. Because she lives on the second floor she is unable to do much community ambulation. Her PCP has written a note to her landlord asking for her to be moved to a first floor apartment. She also sleeps very poorly at night due to the pain.   Average pain is currently 10/10, same as last visit. She finished her last Tramadol last night. She has never tried Percocet before and prefers an alternative from Tramadol since she is not getting any relief from it. She is not able to engage in activities with her family due to her pain. She has a 67 year old grandson whom she would like to spend time with. She is very proud of her granddaughter who is studying at [employer].   Pain Inventory Average Pain 10 Pain Right Now 10 My pain is sharp, stabbing and aching  In the last 24 hours, has pain interfered with the following? General activity 5 Relation with others 8 Enjoyment of life 10 What TIME of day is your pain at its worst? all Sleep (in general) Poor  Pain is worse with:  walking, bending, sitting, standing and some activites Pain improves with: medication Relief from Meds: 1  Mobility walk without assistance walk with assistance use a walker ability to climb steps?  yes do you drive?  no  Function disabled: date disabled .  Neuro/Psych numbness tingling trouble walking anxiety  Prior Studies Any changes since last visit?  no  Physicians involved in your care Any changes since last visit?  no   History reviewed. No pertinent family history. Social History   Socioeconomic History  . Marital status: Divorced    Spouse name: Not on file  . Number of children: Not on file  . Years of education: Not on file  . Highest education level: Not on file  Occupational History  . Not on file  Tobacco Use  . Smoking status: Never Smoker  . Smokeless tobacco: Never Used  Substance and Sexual Activity  . Alcohol use: No  . Drug use: No  . Sexual activity: Not on file  Other Topics Concern  . Not on file  Social History Narrative  . Not on file   Social Determinants of Health   Financial Resource Strain:   . Difficulty of Paying Living Expenses:   Food Insecurity:   . Worried About Charity fundraiser in the Last Year:   . [place] in the Last Year:   Transportation Needs:   . Lack of  Transportation (Medical):   Marland Kitchen Lack of Transportation (Non-Medical):   Physical Activity:   . Days of Exercise per Week:   . Minutes of Exercise per Session:   Stress:   . Feeling of Stress :   Social Connections:   . Frequency of Communication with Friends and Family:   . Frequency of Social Gatherings with Friends and Family:   . Attends Religious Services:   . Active Member of Clubs or Organizations:   . Attends Archivist Meetings:   Marland Kitchen Marital Status:    Past Surgical History:  Procedure Laterality Date  . APPENDECTOMY    . BREAST SURGERY Left    lymphnodes removed in the left arm X2  . REVERSE SHOULDER ARTHROPLASTY Right  05/25/2017  . REVERSE SHOULDER ARTHROPLASTY Right 05/25/2017   Procedure: REVERSE SHOULDER ARTHROPLASTY;  Surgeon: Tania Ade, MD;  Location: [facility];  Service: Orthopedics;  Laterality: Right;  RIGHT REVERSE TOTAL SHOULDER ARTHROPLASTY   Past Medical History:  Diagnosis Date  . Arthritis    right shoulder  . Complication of anesthesia    hard to wake up  . History of kidney stones   . Hypertension   . Hypothyroidism   . Pneumonia    history  . RSD upper limb    left   . Skin cancer    chest   BP (!) 155/85   Pulse 86   Temp 97.7 F (36.5 C)   Ht 4\' 10"  (1.473 m)   Wt 134 lb (60.8 kg)   SpO2 97%   BMI 28.01 kg/m   Opioid Risk Score:   Fall Risk Score:  `1  Depression screen PHQ 2/9  No flowsheet data found.  Review of Systems  Constitutional: Negative.   HENT: Negative.   Eyes: Negative.   Respiratory: Negative.   Cardiovascular: Negative.   Gastrointestinal: Negative.   Endocrine: Negative.   Genitourinary: Negative.   Musculoskeletal: Negative.   Skin: Negative.   Allergic/Immunologic: Negative.   Neurological: Positive for numbness.       Tingling  Hematological: Negative.   Psychiatric/Behavioral: The patient is nervous/anxious.   All other systems reviewed and are negative.      Objective:   Physical Exam  Gen: no distress, normal appearing HEENT: oral mucosa pink and moist, NCAT Cardio: Reg rate Chest: normal effort, normal rate of breathing Abd: soft, non-distended Ext: no edema Skin: intact Neuro: AOx3.  Musculoskeletal: Antalgic gait. Unable to perform heel to toe walk. + slump test on left. Tenderness to palpation along left sided paraspinals. Severely restricted range of motion of left shoulder.  Psych: pleasant, normal affect      Assessment & Plan:  Marie Burns is a 74 year old woman who presents with lower back pain, worse on the left side, that radiates posteriorly into her left leg. She has had this pain for 15  years.  Pain is secondary to left sided stenosis with neurogenic claudication of S1 nerve root --MRI reviewed with patient and is consistent with this diagnosis --She defers surgical intervention at this time --She has had epidural steroid injection without any benefit.  --She has not find relief from all medications discussed in H&P. --Urine drug screen reviewed and clear, and pain contract signed previously. She is not benefiting from Tramadol. Will switch to Percocet 7.5mg  q4H prn. Advised her to call me to discuss response to medication change. Provided 3 week supply and will see her back in 3 weeks.  -Discussed quality of life goals.  Left shoulder pain with restricted range of motion likely secondary to adhesive capsulitis. Ordered XR of left shoulder. Will call patient to discuss results once obtained.   RTC in 3 weeks. I will call before this date to assess her transition between pain relief medications. Advised regarding risks of hypotension and constipation and asked that she alert me should she experience these symptoms.

## 2020-03-17 ENCOUNTER — Other Ambulatory Visit: Payer: Self-pay

## 2020-03-17 ENCOUNTER — Ambulatory Visit (HOSPITAL_COMMUNITY)
Admission: RE | Admit: 2020-03-17 | Discharge: 2020-03-17 | Disposition: A | Discharge status: Home / Self Care | Payer: Medicare Other | Source: Ambulatory Visit | Attending: Physical Medicine and Rehabilitation | Admitting: Physical Medicine and Rehabilitation

## 2020-03-17 DIAGNOSIS — G8929 Other chronic pain: Secondary | ICD-10-CM

## 2020-03-17 DIAGNOSIS — M25512 Pain in left shoulder: Secondary | ICD-10-CM | POA: Insufficient documentation

## 2020-03-24 ENCOUNTER — Encounter
Payer: Medicare Other | Attending: Physical Medicine and Rehabilitation | Admitting: Physical Medicine and Rehabilitation

## 2020-03-24 ENCOUNTER — Encounter: Payer: Self-pay | Admitting: Physical Medicine and Rehabilitation

## 2020-03-24 ENCOUNTER — Other Ambulatory Visit: Payer: Self-pay

## 2020-03-24 VITALS — Temp 97.5°F | Ht <= 58 in | Wt 134.0 lb

## 2020-03-24 DIAGNOSIS — M48062 Spinal stenosis, lumbar region with neurogenic claudication: Secondary | ICD-10-CM | POA: Diagnosis not present

## 2020-03-24 DIAGNOSIS — M25512 Pain in left shoulder: Secondary | ICD-10-CM | POA: Insufficient documentation

## 2020-03-24 DIAGNOSIS — G8929 Other chronic pain: Secondary | ICD-10-CM | POA: Insufficient documentation

## 2020-03-24 DIAGNOSIS — Z5181 Encounter for therapeutic drug level monitoring: Secondary | ICD-10-CM | POA: Insufficient documentation

## 2020-03-24 DIAGNOSIS — G894 Chronic pain syndrome: Secondary | ICD-10-CM | POA: Diagnosis not present

## 2020-03-24 MED ORDER — OXYCODONE HCL 5 MG PO TABS: 5.0000 mg | ORAL_TABLET | ORAL | 0 refills | Status: DC | PRN

## 2020-03-24 NOTE — Progress Notes (Addendum)
Subjective:    Patient ID: Marie Burns, female    DOB: 1946/02/06, 74 y.o.   MRN: [id number]  HPI  Marie Burns is a 74 year old woman who presents for follow-up of lower back pain, worse on the left side, that radiates posteriorly into her left leg. She has had this pain for15years.  Prior medications she has tried: Hydromorphone 4mg , Norco 7.5, Tramadol 100 q6H, Lyrica 150, Meloxicam 15, Amitriptyline, Cymbalta, Gabapentin, Tramadol 100mg  q6H from which she did not get any relief or had side effects. She has been takind Oxycodone 5mg  q4H since last visit with much better relief. Ran out of medication yesterday and thus has high pain level today. Has not experienced side effects from the medication.   She has had a Lumbar MRI in 2020, personally reviewed and discussed with patient, that shows severe spinal stenosis. Her PCP has recommended that she have surgery but she does not want to pursue this treatment option. She has had an epidural steroid injection without relief.   She has been able to mobilize more, do puzzles, spend time with her family, and sleep better since starting the Oxycodone.   She has a 75 year old grandson whom she would like to spend time with. She is very proud of her granddaughter who is studying at [employer]. Her family is her main source of pride and joy.    Pain Inventory Average Pain 10 Pain Right Now 10 My pain is constant, sharp, stabbing and aching  In the last 24 hours, has pain interfered with the following? General activity 9 Relation with others 0 Enjoyment of life 10 What TIME of day is your pain at its worst? all Sleep (in general) Fair  Pain is worse with: walking, bending, sitting, standing and some activites Pain improves with: medication Relief from Meds: 8  Mobility walk with assistance use a cane ability to climb steps?  yes do you drive?  no  Function disabled: date disabled .  Neuro/Psych No problems in this  area  Prior Studies Any changes since last visit?  no  Physicians involved in your care Any changes since last visit?  no   History reviewed. No pertinent family history. Social History   Socioeconomic History  . Marital status: Divorced    Spouse name: Not on file  . Number of children: Not on file  . Years of education: Not on file  . Highest education level: Not on file  Occupational History  . Not on file  Tobacco Use  . Smoking status: Never Smoker  . Smokeless tobacco: Never Used  Substance and Sexual Activity  . Alcohol use: No  . Drug use: No  . Sexual activity: Not on file  Other Topics Concern  . Not on file  Social History Narrative  . Not on file   Social Determinants of Health   Financial Resource Strain:   . Difficulty of Paying Living Expenses:   Food Insecurity:   . Worried About Charity fundraiser in the Last Year:   . Arboriculturist in the Last Year:   Transportation Needs:   . Film/video editor (Medical):   Marland Kitchen Lack of Transportation (Non-Medical):   Physical Activity:   . Days of Exercise per Week:   . Minutes of Exercise per Session:   Stress:   . Feeling of Stress :   Social Connections:   . Frequency of Communication with Friends and Family:   . Frequency of Social Gatherings with  Friends and Family:   . Attends Religious Services:   . Active Member of Clubs or Organizations:   . Attends Archivist Meetings:   Marland Kitchen Marital Status:    Past Surgical History:  Procedure Laterality Date  . APPENDECTOMY    . BREAST SURGERY Left    lymphnodes removed in the left arm X2  . REVERSE SHOULDER ARTHROPLASTY Right 05/25/2017  . REVERSE SHOULDER ARTHROPLASTY Right 05/25/2017   Procedure: REVERSE SHOULDER ARTHROPLASTY;  Surgeon: Tania Ade, MD;  Location: [facility];  Service: Orthopedics;  Laterality: Right;  RIGHT REVERSE TOTAL SHOULDER ARTHROPLASTY   Past Medical History:  Diagnosis Date  . Arthritis    right shoulder  .  Complication of anesthesia    hard to wake up  . History of kidney stones   . Hypertension   . Hypothyroidism   . Pneumonia    history  . RSD upper limb    left   . Skin cancer    chest   Temp (!) 97.5 F (36.4 C)   Ht 4\' 10"  (1.473 m)   Wt 134 lb (60.8 kg)   BMI 28.01 kg/m   Opioid Risk Score:   Fall Risk Score:  `1  Depression screen PHQ 2/9  No flowsheet data found.  Review of Systems  Constitutional: Negative.   HENT: Negative.   Eyes: Negative.   Respiratory: Negative.   Cardiovascular: Negative.   Gastrointestinal: Negative.   Endocrine: Negative.   Genitourinary: Negative.   Musculoskeletal: Positive for arthralgias, back pain and gait problem.  Skin: Negative.   Allergic/Immunologic: Negative.   Psychiatric/Behavioral: Negative.   All other systems reviewed and are negative.      Objective:   Physical Exam  Gen: no distress, normal appearing HEENT: oral mucosa pink and moist, NCAT Cardio: Reg rate Chest: normal effort, normal rate of breathing Abd: soft, non-distended Ext: no edema Skin: intact Neuro:AOx3. Musculoskeletal:Antalgic gait. Unable to perform heel to toe walk. + slump test on left. Tenderness to palpation along left sided paraspinals.Severely restricted range of motion of left shoulder.  Psych: pleasant, normal affect     Assessment & Plan:  Marie Burns is a 74 year old woman who presents with lower back pain, worse on the left side, that radiates posteriorly into her left leg. She has had this pain for15years.  Pain is secondary to left sided stenosis with neurogenic claudication of S1 nerve root --MRI reviewed with patient and is consistent with this diagnosis --She defers surgical intervention at this time --She has had epidural steroid injection without any benefit.  --She has not find relief from all medications discussed in H&P. --Urine drug screen reviewed and clear, and pain contract signed previously. She has had  good relief and improved quality of life since starting Percocet 7.5mg  q4H prn.  Advised to only take PRN and not scheduled as she did last time.  -Discussed quality of life goals.   Left shoulder pain with restricted range of motion but XR personally reviewed and shows no evidence of arthritis. May be secondary to impingement syndrome.   RTC in 4 weeks. All questions answered.

## 2020-03-25 ENCOUNTER — Telehealth: Payer: Self-pay | Admitting: *Deleted

## 2020-03-25 MED ORDER — OXYCODONE-ACETAMINOPHEN 7.5-325 MG PO TABS: 1.0000 | ORAL_TABLET | ORAL | 0 refills | Status: DC | PRN

## 2020-03-25 NOTE — Telephone Encounter (Signed)
Calling now; thank you!

## 2020-03-25 NOTE — Telephone Encounter (Signed)
Patient left a message asking for Dr. Ranell Patrick to please call her. She was seen yesterday and has some questions.

## 2020-03-25 NOTE — Addendum Note (Signed)
Addended by: Izora Ribas on: 03/25/2020 11:14 AM   Modules accepted: Orders

## 2020-04-15 ENCOUNTER — Other Ambulatory Visit: Payer: Self-pay | Admitting: Physical Medicine and Rehabilitation

## 2020-04-15 MED ORDER — GABAPENTIN 300 MG PO CAPS: 300.0000 mg | ORAL_CAPSULE | Freq: Three times a day (TID) | ORAL | 2 refills | Status: DC

## 2020-04-15 MED ORDER — AMITRIPTYLINE HCL 10 MG PO TABS: 10.0000 mg | ORAL_TABLET | Freq: Every day | ORAL | 1 refills | Status: DC

## 2020-04-21 ENCOUNTER — Encounter: Payer: Self-pay | Admitting: Physical Medicine and Rehabilitation

## 2020-04-21 ENCOUNTER — Other Ambulatory Visit: Payer: Self-pay

## 2020-04-21 ENCOUNTER — Encounter
Payer: Medicare Other | Attending: Physical Medicine and Rehabilitation | Admitting: Physical Medicine and Rehabilitation

## 2020-04-21 VITALS — BP 155/75 | HR 82 | Temp 98.2°F | Ht <= 58 in | Wt 134.4 lb

## 2020-04-21 DIAGNOSIS — K921 Melena: Secondary | ICD-10-CM | POA: Diagnosis not present

## 2020-04-21 DIAGNOSIS — M25512 Pain in left shoulder: Secondary | ICD-10-CM | POA: Insufficient documentation

## 2020-04-21 DIAGNOSIS — Z5181 Encounter for therapeutic drug level monitoring: Secondary | ICD-10-CM | POA: Insufficient documentation

## 2020-04-21 DIAGNOSIS — G894 Chronic pain syndrome: Secondary | ICD-10-CM | POA: Insufficient documentation

## 2020-04-21 DIAGNOSIS — M48062 Spinal stenosis, lumbar region with neurogenic claudication: Secondary | ICD-10-CM | POA: Diagnosis not present

## 2020-04-21 DIAGNOSIS — G8929 Other chronic pain: Secondary | ICD-10-CM | POA: Insufficient documentation

## 2020-04-21 MED ORDER — OXYCODONE-ACETAMINOPHEN 10-325 MG PO TABS: 1.0000 | ORAL_TABLET | ORAL | 0 refills | Status: DC | PRN

## 2020-04-21 MED ORDER — DICLOFENAC SODIUM 1 % EX GEL: 2.0000 g | Freq: Four times a day (QID) | CUTANEOUS | 3 refills | Status: DC

## 2020-04-21 NOTE — Progress Notes (Signed)
Subjective:    Patient ID: Marie Burns, female    DOB: 29-Dec-1945, 74 y.o.   MRN: [id number]  HPI  Mrs. Sherman is a 74 year old woman who presents for follow-up of lower back pain, worse on the left side, that radiates posteriorly into her left leg. She has had this pain for 15 years.   Prior medications she has tried: Hydromorphone 4mg , Norco 7.5, Tramadol 100 q6H, Lyrica 150, Meloxicam 15, Amitriptyline, Cymbalta, Gabapentin, Tramadol 100mg  q6H from which she did not get any relief or had side effects. She has been taking Percocet 7.5mg  q4H PRN since last visit with much better relief. Ran out of medication 4 days ago and thus has high pain level today. Has not experienced side effects from the medication. I prescribed her gabapentin 200mg  TID 4 days ago and this has helped to minimize the burning in her legs.   She has had a Lumbar MRI in 2020, personally reviewed and discussed with patient, that shows severe spinal stenosis. Her PCP has recommended that she have surgery but she does not want to pursue this treatment option. She has had an epidural steroid injection without relief.    She has been able to mobilize more, do puzzles, spend time with her family, and sleep better since starting the Percocet. Her family invited her to go to [place] with them at the end of June but she is hesitant because she does not want to slow them down and knows she cannot walk the whole time.    She has a 79 year old grandson whom she would like to spend time with. She is very proud of her granddaughter who is studying at [employer]. Her family is her main source of pride and joy.   She is currently very worried about new black stools this morning. This is an isolated episode. She denies current abdominal pain. She is afraid she has cancer as her father had cancer. She has never had a colonoscopy as she is afraid of having bowel incontinence due to the prep while being transported to the colonoscopy.    Pain Inventory Average Pain 9 Pain Right Now 8 My pain is sharp, burning, stabbing, tingling and aching  In the last 24 hours, has pain interfered with the following? General activity 4 Relation with others 6 Enjoyment of life 9 What TIME of day is your pain at its worst? all Sleep (in general) Fair  Pain is worse with: walking, bending, standing and some activites Pain improves with: medication Relief from Meds: 7  Mobility walk with assistance use a cane ability to climb steps?  yes do you drive?  no  Function disabled: date disabled . I need assistance with the following:  household duties and shopping  Neuro/Psych numbness tingling trouble walking  Prior Studies Any changes since last visit?  no  Physicians involved in your care Any changes since last visit?  no   No family history on file. Social History   Socioeconomic History  . Marital status: Divorced    Spouse name: Not on file  . Number of children: Not on file  . Years of education: Not on file  . Highest education level: Not on file  Occupational History  . Not on file  Tobacco Use  . Smoking status: Never Smoker  . Smokeless tobacco: Never Used  Substance and Sexual Activity  . Alcohol use: No  . Drug use: No  . Sexual activity: Not on file  Other Topics Concern  .  Not on file  Social History Narrative  . Not on file   Social Determinants of Health   Financial Resource Strain:   . Difficulty of Paying Living Expenses:   Food Insecurity:   . Worried About Charity fundraiser in the Last Year:   . Arboriculturist in the Last Year:   Transportation Needs:   . Film/video editor (Medical):   Marland Kitchen Lack of Transportation (Non-Medical):   Physical Activity:   . Days of Exercise per Week:   . Minutes of Exercise per Session:   Stress:   . Feeling of Stress :   Social Connections:   . Frequency of Communication with Friends and Family:   . Frequency of Social Gatherings with  Friends and Family:   . Attends Religious Services:   . Active Member of Clubs or Organizations:   . Attends Archivist Meetings:   Marland Kitchen Marital Status:    Past Surgical History:  Procedure Laterality Date  . APPENDECTOMY    . BREAST SURGERY Left    lymphnodes removed in the left arm X2  . REVERSE SHOULDER ARTHROPLASTY Right 05/25/2017  . REVERSE SHOULDER ARTHROPLASTY Right 05/25/2017   Procedure: REVERSE SHOULDER ARTHROPLASTY;  Surgeon: Tania Ade, MD;  Location: [facility];  Service: Orthopedics;  Laterality: Right;  RIGHT REVERSE TOTAL SHOULDER ARTHROPLASTY   Past Medical History:  Diagnosis Date  . Arthritis    right shoulder  . Complication of anesthesia    hard to wake up  . History of kidney stones   . Hypertension   . Hypothyroidism   . Pneumonia    history  . RSD upper limb    left   . Skin cancer    chest   BP (!) 155/75   Pulse 82   Temp 98.2 F (36.8 C)   Ht 4\' 10"  (1.473 m)   Wt 134 lb 6.4 oz (61 kg)   SpO2 96%   BMI 28.09 kg/m   Opioid Risk Score:   Fall Risk Score:  `1  Depression screen PHQ 2/9  No flowsheet data found.  Review of Systems  Musculoskeletal: Positive for gait problem.  Neurological: Positive for numbness.       Tingling  All other systems reviewed and are negative.      Objective:   Physical Exam Gen: no distress, normal appearing HEENT: oral mucosa pink and moist, NCAT Cardio: Reg rate Chest: normal effort, normal rate of breathing Abd: soft, non-distended Ext: no edema Skin: intact Neuro: AOx3.  Musculoskeletal: Antalgic gait. Unable to perform heel to toe walk. + slump test on left. Tenderness to palpation along left sided paraspinals. Severely restricted range of motion of left shoulder.  +left sided numbness and tingling in medial three and a half digits. Psych: pleasant, normal affect     Assessment & Plan:  Mrs. Kohls is a 74 year old woman who presents with lower back pain, worse on the left side,  that radiates posteriorly into her left leg. She has had this pain for 15 years.   1) Pain is secondary to left sided stenosis with neurogenic claudication of S1 nerve root --MRI reviewed with patient and is consistent with this diagnosis --She defers surgical intervention at this time --She has had epidural steroid injection without any benefit.  --She has not find relief from all medications discussed in H&P. --Urine drug screen reviewed and clear, and pain contract signed previously. She has had good relief and improved quality of life  since starting Percocet 7.5mg  q4H prn.  Advised to only take PRN and not scheduled as she did last time. Will increase dose to 10mg  q4H PRN for better pain relief. Have also added Gabapentin 300mg  TID which has been helping.  -Discussed quality of life goals. Recommended that she go with family on trip to [place] and use motorized vehicle to get around as socialization will be beneficial to her.    2) Left shoulder pain with restricted range of motion but XR personally reviewed and shows no evidence of arthritis. May be secondary to impingement syndrome.   3) Left sided carpal tunnel syndrome: Recommended OTC brace to be worn at night. Discussed mechanism of medial nerve compression and how brace helps hand to maintain neutral position at night.  4) Black stools: Discussed possible etiologies--eating dark colored foods previous day, GI ulcer. Advised that she discuss with her PCP. Given family history of cancer (she is not sure what her father had), she should get a colonoscopy. Advised that she can wear an adult diaper to minimize risk of incontinence during transportation after taking prep. She defers colonoscopy at this time but may consider next month. Discussed continuing to monitor for dark stools.   5) MCP osteoarthritis -Prescribe diclofenac gel for joints up to 4 times per day.    Forty minutes of face to face patient care time were spent during this  visit. All questions were encouraged and answered. RTC in 4 weeks. All questions answered.

## 2020-04-27 ENCOUNTER — Other Ambulatory Visit: Payer: Self-pay | Admitting: Physical Medicine and Rehabilitation

## 2020-04-27 MED ORDER — FUROSEMIDE 20 MG PO TABS: 20.0000 mg | ORAL_TABLET | Freq: Every day | ORAL | 1 refills | Status: DC

## 2020-05-20 ENCOUNTER — Encounter: Payer: Medicare HMO | Attending: Physical Medicine and Rehabilitation | Admitting: Physical Medicine and Rehabilitation

## 2020-05-20 ENCOUNTER — Encounter: Payer: Self-pay | Admitting: Physical Medicine and Rehabilitation

## 2020-05-20 ENCOUNTER — Other Ambulatory Visit: Payer: Self-pay

## 2020-05-20 VITALS — BP 129/71 | HR 74 | Temp 99.2°F | Ht <= 58 in | Wt 132.0 lb

## 2020-05-20 DIAGNOSIS — M48062 Spinal stenosis, lumbar region with neurogenic claudication: Secondary | ICD-10-CM

## 2020-05-20 DIAGNOSIS — K5903 Drug induced constipation: Secondary | ICD-10-CM | POA: Diagnosis not present

## 2020-05-20 DIAGNOSIS — G894 Chronic pain syndrome: Secondary | ICD-10-CM | POA: Insufficient documentation

## 2020-05-20 DIAGNOSIS — M7542 Impingement syndrome of left shoulder: Secondary | ICD-10-CM

## 2020-05-20 DIAGNOSIS — Z5181 Encounter for therapeutic drug level monitoring: Secondary | ICD-10-CM | POA: Diagnosis not present

## 2020-05-20 DIAGNOSIS — M25512 Pain in left shoulder: Secondary | ICD-10-CM

## 2020-05-20 DIAGNOSIS — M79605 Pain in left leg: Secondary | ICD-10-CM | POA: Diagnosis not present

## 2020-05-20 DIAGNOSIS — R600 Localized edema: Secondary | ICD-10-CM

## 2020-05-20 DIAGNOSIS — G8929 Other chronic pain: Secondary | ICD-10-CM

## 2020-05-20 MED ORDER — GABAPENTIN 400 MG PO CAPS: 400.0000 mg | ORAL_CAPSULE | Freq: Three times a day (TID) | ORAL | 3 refills | Status: DC

## 2020-05-20 MED ORDER — FUROSEMIDE 20 MG PO TABS: 20.0000 mg | ORAL_TABLET | Freq: Every day | ORAL | 1 refills | Status: DC

## 2020-05-20 MED ORDER — SENNA 8.6 MG PO TABS: 2.0000 | ORAL_TABLET | Freq: Every day | ORAL | 0 refills | Status: DC

## 2020-05-20 MED ORDER — OXYCODONE-ACETAMINOPHEN 10-325 MG PO TABS: 1.0000 | ORAL_TABLET | ORAL | 0 refills | Status: DC | PRN

## 2020-05-20 MED ORDER — MILK OF MAGNESIA CONCENTRATE 2400 MG/10ML PO SUSP: 15.0000 mL | Freq: Every day | ORAL | 3 refills | Status: DC | PRN

## 2020-05-20 NOTE — Progress Notes (Signed)
Subjective:    Patient ID: Marie Burns, female    DOB: [birth year], 74 y.o.   MRN: [id number]  HPI  Marie Burns is a 74 year old woman who presents for follow-up of lower back pain, worse on the left side, that radiates posteriorly into her left leg. She has had this pain for 15 years.  She says that today she is in pain. Pain has been worse since her fall 1 month prior.    Prior medications she has tried: Hydromorphone 4mg , Norco 7.5, Tramadol 100 q6H, Lyrica 150, Meloxicam 15, Amitriptyline, Cymbalta, Gabapentin, Tramadol 100mg  q6H from which she did not get any relief or had side effects. She has been taking Percocet 7.5mg  q4H PRN since last visit with much better relief. Ran out of medication 4 days ago and thus has high pain level today. Has not experienced side effects from the medication. I prescribed her gabapentin 200mg  TID and this has helped to minimize the burning in her legs.   She has had a Lumbar MRI in 2020, personally reviewed and discussed with patient, that shows severe spinal stenosis. Her PCP has recommended that she have surgery but she does not want to pursue this treatment option. She has had an epidural steroid injection without relief. She feels that her back pain is worse this visit.    She had been able to mobilize more, do puzzles, spend time with her family, and sleep better since starting the Percocet. Her family invited her to go to [employer] with them at the end of June but she is hesitant because she does not want to slow them down and knows she cannot walk the whole time.    She has a 32 year old grandson whom she would like to spend time with. She is very proud of her granddaughter who is studying at [employer]. Her family is her main source of pride and joy.   Her hands feel very sensitive. She feels numbness in all digits. This is present in both hands. She denies neck pain. This numbness is there most of the time and has been worse after the fall.   Her  left shoulder still hurts to elevate it and abduct it.   She is currently very worried about new black stools this morning. This is an isolated episode. She denies current abdominal pain. She is afraid she has cancer as her father had cancer. She has never had a colonoscopy as she is afraid of having bowel incontinence due to the prep while being transported to the colonoscopy.   She takes the Gabapentin three times per day 300mg . It does not make her too sleepy.   She has been having constipation.   Pain Inventory Average Pain 9 Pain Right Now 10 My pain is constant, sharp, burning, tingling and aching  In the last 24 hours, has pain interfered with the following? General activity 9 Relation with others 10 Enjoyment of life 10 What TIME of day is your pain at its worst? All the time. Sleep (in general) Poor  Pain is worse with: walking, bending, sitting and standing Pain improves with: medication Relief from Meds: 4  Mobility walk with assistance use a cane how many minutes can you walk? 20-30 mins ability to climb steps?  yes do you drive?  no  Function disabled: date disabled "A long time ago." I need assistance with the following:  household duties and shopping  Neuro/Psych bowel control problems numbness tingling trouble walking depression anxiety  Prior  Studies Any changes since last visit?  no  Physicians involved in your care Any changes since last visit?  no   History reviewed. No pertinent family history. Social History   Socioeconomic History  . Marital status: Divorced    Spouse name: Not on file  . Number of children: Not on file  . Years of education: Not on file  . Highest education level: Not on file  Occupational History  . Not on file  Tobacco Use  . Smoking status: Never Smoker  . Smokeless tobacco: Never Used  Vaping Use  . Vaping Use: Never used  Substance and Sexual Activity  . Alcohol use: No  . Drug use: No  . Sexual  activity: Not on file  Other Topics Concern  . Not on file  Social History Narrative  . Not on file   Social Determinants of Health   Financial Resource Strain:   . Difficulty of Paying Living Expenses:   Food Insecurity:   . Worried About Charity fundraiser in the Last Year:   . Arboriculturist in the Last Year:   Transportation Needs:   . Film/video editor (Medical):   Marland Kitchen Lack of Transportation (Non-Medical):   Physical Activity:   . Days of Exercise per Week:   . Minutes of Exercise per Session:   Stress:   . Feeling of Stress :   Social Connections:   . Frequency of Communication with Friends and Family:   . Frequency of Social Gatherings with Friends and Family:   . Attends Religious Services:   . Active Member of Clubs or Organizations:   . Attends Archivist Meetings:   Marland Kitchen Marital Status:    Past Surgical History:  Procedure Laterality Date  . APPENDECTOMY    . BREAST SURGERY Left    lymphnodes removed in the left arm X2  . REVERSE SHOULDER ARTHROPLASTY Right 05/25/2017  . REVERSE SHOULDER ARTHROPLASTY Right 05/25/2017   Procedure: REVERSE SHOULDER ARTHROPLASTY;  Surgeon: Tania Ade, MD;  Location: [facility];  Service: Orthopedics;  Laterality: Right;  RIGHT REVERSE TOTAL SHOULDER ARTHROPLASTY   Past Medical History:  Diagnosis Date  . Arthritis    right shoulder  . Complication of anesthesia    hard to wake up  . History of kidney stones   . Hypertension   . Hypothyroidism   . Pneumonia    history  . RSD upper limb    left   . Skin cancer    chest   Temp 99.2 F (37.3 C)   Ht 4\' 10"  (1.473 m)   Wt 132 lb (59.9 kg)   BMI 27.59 kg/m   Opioid Risk Score:   Fall Risk Score:  `1  Depression screen PHQ 2/9  No flowsheet data found.  Review of Systems  Constitutional: Negative.   HENT: Negative.   Eyes: Negative.   Respiratory: Negative.   Cardiovascular: Positive for leg swelling.  Gastrointestinal: Negative.   Endocrine:  Negative.   Genitourinary: Positive for urgency.  Musculoskeletal: Positive for back pain and gait problem.  Skin: Negative.   Allergic/Immunologic: Negative.   Neurological: Positive for numbness.       Tingling  Hematological: Negative.   Psychiatric/Behavioral:       Anxiety, Depression  All other systems reviewed and are negative.      Objective:   Physical Exam Gen: no distress, normal appearing HEENT: oral mucosa pink and moist, NCAT Cardio: Reg rate Chest: normal effort, normal  rate of breathing Abd: soft, non-distended Ext: no edema Skin: intact Neuro: AOx3.  Musculoskeletal: Antalgic gait. Unable to perform heel to toe walk. + slump test on left. Tenderness to palpation along left sided paraspinals. Severely restricted range of motion of left shoulder.  +left sided numbness and tingling in medial three and a half digits. Psych: pleasant, normal affect     Assessment & Plan:  Mrs. Folino is a 74 year old woman who presents with lower back pain, worse on the left side, that radiates posteriorly into her left leg. She has had this pain for 15 years.   1) Pain is secondary to left sided stenosis with neurogenic claudication of S1 nerve root --MRI reviewed with patient and is consistent with this diagnosis --She defers surgical intervention at this time --She has had epidural steroid injection without any benefit.  --She has not find relief from all medications discussed in H&P. --Urine drug screen reviewed and clear, and pain contract signed previously. She has had good relief and improved quality of life since starting Percocet 10mg  q4H prn (90 MME).  Advised to only take PRN and not scheduled as she did last time. Have also increased Gabapentin to 400mg  TID for neuropathic pain in hands and legs.   -Discussed quality of life goals.    2) Left shoulder pain with restricted range of motion but XR personally reviewed and shows no evidence of arthritis. May be secondary to  impingement syndrome.  -Plan for left corticosteroid injection next visit. She has had these in the past and they have been beneficial.   3) Left sided carpal tunnel syndrome: Recommended OTC brace to be worn at night. Discussed mechanism of medial nerve compression and how brace helps hand to maintain neutral position at night.  4) Black stools: Discussed possible etiologies--eating dark colored foods previous day, GI ulcer. Advised that she discuss with her PCP. Given family history of cancer (she is not sure what her father had), she should get a colonoscopy. Advised that she can wear an adult diaper to minimize risk of incontinence during transportation after taking prep. She defers colonoscopy at this time but may consider next month. Discussed continuing to monitor for dark stools.   5) MCP osteoarthritis -Prescribe diclofenac gel for joints up to 4 times per day. This has not been helping much. Can consider steroid injection in the future.   6) Abdominal pain: likely secondary to opioid induced constipation. Will obtain abdominal XR. Take colace TID and Senna 2 tabs at night. Also prescribed milk of magnesia to take today.  7) Bilateral lower extremity swelling. Prescribed Lasix. Check creatinine today while on lasix. Check BNP given swelling. Continue ice, elevation, compression garments, vascular US of left lower extremity ordered given sharp pain with palpation.    40 minutes spent in care of patient addressing pain, bilateral lower extremity edema, constipation, and quality of life. All questions were encouraged and answered. RTC in 4 weeks.

## 2020-05-21 LAB — BASIC METABOLIC PANEL
BUN/Creatinine Ratio: 32 — ABNORMAL HIGH (ref 12–28)
BUN: 33 mg/dL — ABNORMAL HIGH (ref 8–27)
CO2: 21 mmol/L (ref 20–29)
Calcium: 9.2 mg/dL (ref 8.7–10.3)
Chloride: 102 mmol/L (ref 96–106)
Creatinine, Ser: 1.03 mg/dL — ABNORMAL HIGH (ref 0.57–1.00)
GFR calc Af Amer: 62 mL/min/{1.73_m2} (ref >59)
GFR calc non Af Amer: 54 mL/min/{1.73_m2} — ABNORMAL LOW (ref >59)
Glucose: 87 mg/dL (ref 65–99)
Potassium: 5.7 mmol/L — ABNORMAL HIGH (ref 3.5–5.2)
Sodium: 141 mmol/L (ref 134–144)

## 2020-05-21 LAB — BRAIN NATRIURETIC PEPTIDE: BNP: 25.8 pg/mL (ref 0.0–100.0)

## 2020-05-29 ENCOUNTER — Other Ambulatory Visit: Payer: Self-pay

## 2020-05-29 ENCOUNTER — Ambulatory Visit (HOSPITAL_COMMUNITY)
Admission: RE | Admit: 2020-05-29 | Discharge: 2020-05-29 | Disposition: A | Discharge status: Home / Self Care | Payer: Medicare HMO | Source: Ambulatory Visit | Attending: Physical Medicine and Rehabilitation | Admitting: Physical Medicine and Rehabilitation

## 2020-05-29 ENCOUNTER — Encounter (HOSPITAL_COMMUNITY): Payer: Medicare HMO

## 2020-05-29 DIAGNOSIS — M79605 Pain in left leg: Secondary | ICD-10-CM | POA: Diagnosis not present

## 2020-05-29 DIAGNOSIS — R600 Localized edema: Secondary | ICD-10-CM

## 2020-06-04 ENCOUNTER — Encounter (HOSPITAL_COMMUNITY): Payer: Medicare HMO

## 2020-06-09 ENCOUNTER — Other Ambulatory Visit: Payer: Self-pay | Admitting: Physical Medicine and Rehabilitation

## 2020-06-09 MED ORDER — VALACYCLOVIR HCL 1 G PO TABS: 2000.0000 mg | ORAL_TABLET | Freq: Two times a day (BID) | ORAL | 0 refills | Status: AC

## 2020-06-19 ENCOUNTER — Encounter: Payer: Self-pay | Admitting: Physical Medicine and Rehabilitation

## 2020-06-19 ENCOUNTER — Encounter: Payer: Medicare HMO | Attending: Physical Medicine and Rehabilitation | Admitting: Physical Medicine and Rehabilitation

## 2020-06-19 ENCOUNTER — Other Ambulatory Visit: Payer: Self-pay

## 2020-06-19 VITALS — BP 134/76 | HR 75 | Temp 98.9°F | Ht <= 58 in | Wt 134.0 lb

## 2020-06-19 DIAGNOSIS — G894 Chronic pain syndrome: Secondary | ICD-10-CM | POA: Insufficient documentation

## 2020-06-19 DIAGNOSIS — G8929 Other chronic pain: Secondary | ICD-10-CM | POA: Diagnosis not present

## 2020-06-19 DIAGNOSIS — K5903 Drug induced constipation: Secondary | ICD-10-CM | POA: Insufficient documentation

## 2020-06-19 DIAGNOSIS — Z5181 Encounter for therapeutic drug level monitoring: Secondary | ICD-10-CM | POA: Insufficient documentation

## 2020-06-19 DIAGNOSIS — R600 Localized edema: Secondary | ICD-10-CM | POA: Insufficient documentation

## 2020-06-19 DIAGNOSIS — M48062 Spinal stenosis, lumbar region with neurogenic claudication: Secondary | ICD-10-CM | POA: Diagnosis not present

## 2020-06-19 DIAGNOSIS — M25512 Pain in left shoulder: Secondary | ICD-10-CM | POA: Insufficient documentation

## 2020-06-19 DIAGNOSIS — M79605 Pain in left leg: Secondary | ICD-10-CM | POA: Diagnosis not present

## 2020-06-19 DIAGNOSIS — Z79891 Long term (current) use of opiate analgesic: Secondary | ICD-10-CM

## 2020-06-19 MED ORDER — B & B CARPAL TUNNEL BRACE MISC: 2.0000 [IU] | Freq: Every day | 0 refills | Status: DC | PRN

## 2020-06-19 MED ORDER — OXYCODONE-ACETAMINOPHEN 10-325 MG PO TABS: 1.0000 | ORAL_TABLET | ORAL | 0 refills | Status: DC | PRN

## 2020-06-19 MED ORDER — KETOROLAC TROMETHAMINE 10 MG PO TABS: 10.0000 mg | ORAL_TABLET | Freq: Four times a day (QID) | ORAL | 0 refills | Status: DC | PRN

## 2020-06-19 NOTE — Progress Notes (Signed)
Shoulder injection, left   Indication: Left Shoulder pain not relieved by medication management and other conservative care.  Informed consent was obtained after describing risks and benefits of the procedure with the patient, this includes bleeding, bruising, infection and medication side effects. The patient wishes to proceed and has given written consent. Patient was placed in a seated position. The left shoulder was marked and prepped with betadine in the subacromial area. A 25-gauge 1-1/2 inch needle was inserted into the subacromial area. After negative draw back for blood, a solution containing 1 mL of celestone and 4 mL of 1% lidocaine was injected. A band aid was applied. The patient tolerated the procedure well. Post procedure instructions were given.

## 2020-07-02 ENCOUNTER — Other Ambulatory Visit: Payer: Self-pay

## 2020-07-02 ENCOUNTER — Other Ambulatory Visit: Payer: Self-pay | Admitting: Physical Medicine and Rehabilitation

## 2020-07-02 ENCOUNTER — Telehealth: Payer: Self-pay

## 2020-07-02 ENCOUNTER — Emergency Department (HOSPITAL_COMMUNITY)
Admission: EM | Admit: 2020-07-02 | Discharge: 2020-07-02 | Disposition: A | Discharge status: Home / Self Care | Payer: Medicare HMO | Attending: Emergency Medicine | Admitting: Emergency Medicine

## 2020-07-02 ENCOUNTER — Ambulatory Visit
Admission: RE | Admit: 2020-07-02 | Discharge: 2020-07-02 | Disposition: A | Discharge status: Home / Self Care | Payer: Medicare HMO | Source: Ambulatory Visit | Attending: Physical Medicine and Rehabilitation | Admitting: Physical Medicine and Rehabilitation

## 2020-07-02 DIAGNOSIS — Y9289 Other specified places as the place of occurrence of the external cause: Secondary | ICD-10-CM | POA: Diagnosis not present

## 2020-07-02 DIAGNOSIS — M25562 Pain in left knee: Secondary | ICD-10-CM | POA: Diagnosis present

## 2020-07-02 DIAGNOSIS — G8929 Other chronic pain: Secondary | ICD-10-CM

## 2020-07-02 DIAGNOSIS — S42412A Displaced simple supracondylar fracture without intercondylar fracture of left humerus, initial encounter for closed fracture: Secondary | ICD-10-CM | POA: Diagnosis not present

## 2020-07-02 DIAGNOSIS — Y998 Other external cause status: Secondary | ICD-10-CM | POA: Diagnosis not present

## 2020-07-02 DIAGNOSIS — W010XXA Fall on same level from slipping, tripping and stumbling without subsequent striking against object, initial encounter: Secondary | ICD-10-CM | POA: Diagnosis not present

## 2020-07-02 DIAGNOSIS — S42292A Other displaced fracture of upper end of left humerus, initial encounter for closed fracture: Secondary | ICD-10-CM | POA: Diagnosis not present

## 2020-07-02 DIAGNOSIS — Y9389 Activity, other specified: Secondary | ICD-10-CM | POA: Insufficient documentation

## 2020-07-02 DIAGNOSIS — M25512 Pain in left shoulder: Secondary | ICD-10-CM

## 2020-07-02 DIAGNOSIS — I10 Essential (primary) hypertension: Secondary | ICD-10-CM | POA: Insufficient documentation

## 2020-07-02 DIAGNOSIS — E039 Hypothyroidism, unspecified: Secondary | ICD-10-CM | POA: Insufficient documentation

## 2020-07-02 DIAGNOSIS — Z79899 Other long term (current) drug therapy: Secondary | ICD-10-CM | POA: Insufficient documentation

## 2020-07-02 MED ORDER — OXYCODONE-ACETAMINOPHEN 5-325 MG PO TABS
1.0000 | ORAL_TABLET | ORAL | Status: DC | PRN
  Administered 2020-07-02: 1 via ORAL
  Filled 2020-07-02: qty 1

## 2020-07-02 MED ORDER — HYDROMORPHONE HCL 1 MG/ML IJ SOLN
2.0000 mg | Freq: Once | INTRAMUSCULAR | Status: AC
  Administered 2020-07-02: 2 mg via INTRAMUSCULAR
  Filled 2020-07-02: qty 2

## 2020-07-02 NOTE — ED Provider Notes (Signed)
[facility] EMERGENCY DEPARTMENT Provider Note   CSN: [id number] Arrival date & time: 07/02/20  1630     History Chief Complaint  Patient presents with  . Fall  . Arm Injury    Marie Burns is a 74 y.o. female.  HPI 74 year old female with a history of hypertension, hypothyroidism, arthritis presents to the ER with left knee pain after a fall on Tuesday.  She states that she got up in the middle the night and lost her balance, and tried to catch her self endorses left arm pain.  She called her PCP the following day who told her to ice, take Tylenol, and wait to see if it improves.  Pain did not improve, and she called her PCP today who ordered a x-ray with [facility] imaging.  She received a call from vascular injury stating that she had a left humerus fracture and was told to come to the ER.  She denies any numbness or tingling, however she does rate her pain a 10/10.  Patient does significant bruising to her left arm and is moaning in pain on presentation. Not on blood thinners. Denies any head trauma or LOC.     Past Medical History:  Diagnosis Date  . Arthritis    right shoulder  . Complication of anesthesia    hard to wake up  . History of kidney stones   . Hypertension   . Hypothyroidism   . Pneumonia    history  . RSD upper limb    left   . Skin cancer    chest    Patient Active Problem List   Diagnosis Date Noted  . S/P reverse total shoulder arthroplasty, right 05/25/2017    Past Surgical History:  Procedure Laterality Date  . APPENDECTOMY    . BREAST SURGERY Left    lymphnodes removed in the left arm X2  . REVERSE SHOULDER ARTHROPLASTY Right 05/25/2017  . REVERSE SHOULDER ARTHROPLASTY Right 05/25/2017   Procedure: REVERSE SHOULDER ARTHROPLASTY;  Surgeon: Tania Ade, MD;  Location: [facility];  Service: Orthopedics;  Laterality: Right;  RIGHT REVERSE TOTAL SHOULDER ARTHROPLASTY     OB History   No obstetric history on file.      No family history on file.  Social History   Tobacco Use  . Smoking status: Never Smoker  . Smokeless tobacco: Never Used  Vaping Use  . Vaping Use: Never used  Substance Use Topics  . Alcohol use: No  . Drug use: No    Home Medications Prior to Admission medications   Medication Sig Start Date End Date Taking? Authorizing Provider  alendronate (FOSAMAX) 70 MG tablet Take 70 mg by mouth every Sunday. 02/06/17   [provider]  amitriptyline (ELAVIL) 10 MG tablet Take 1 tablet (10 mg total) by mouth at bedtime. 04/15/20   Raulkar, Clide Deutscher, MD  diclofenac Sodium (VOLTAREN) 1 % GEL Apply 2 g topically 4 (four) times daily. 04/21/20   Raulkar, Clide Deutscher, MD  Elastic Bandages & Supports (B & B CARPAL TUNNEL BRACE) MISC 2 Units by Does not apply route daily as needed. 06/19/20   Raulkar, Clide Deutscher, MD  furosemide (LASIX) 20 MG tablet Take 1 tablet (20 mg total) by mouth daily. 05/20/20 05/20/21  Izora Ribas, MD  gabapentin (NEURONTIN) 400 MG capsule Take 1 capsule (400 mg total) by mouth 3 (three) times daily. 05/20/20   Raulkar, Clide Deutscher, MD  hydrochlorothiazide (HYDRODIURIL) 25 MG tablet Take 25 mg by mouth  daily at 12 noon. 03/30/17   [provider]  ketorolac (TORADOL) 10 MG tablet Take 1 tablet (10 mg total) by mouth every 6 (six) hours as needed. 06/19/20   Raulkar, Clide Deutscher, MD  levothyroxine (SYNTHROID, LEVOTHROID) 100 MCG tablet Take 100 mcg by mouth daily before breakfast. 04/07/17   [provider]  lisinopril (PRINIVIL,ZESTRIL) 20 MG tablet Take 20 mg by mouth daily at 12 noon. 04/07/17   [provider]  Magnesium Hydroxide (MILK OF MAGNESIA CONCENTRATE) 2400 MG/10ML SUSP Take 15 mLs (3,600 mg total) by mouth daily as needed (constipation). 05/20/20   Raulkar, Clide Deutscher, MD  Multiple Vitamin (MULTIVITAMIN WITH MINERALS) TABS tablet Take 1 tablet by mouth daily.    [provider]  oxyCODONE-acetaminophen (PERCOCET) 10-325 MG tablet Take  1 tablet by mouth every 4 (four) hours as needed for pain. 06/19/20   Raulkar, Clide Deutscher, MD  senna (SENOKOT) 8.6 MG TABS tablet Take 2 tablets (17.2 mg total) by mouth at bedtime. 05/20/20   Raulkar, Clide Deutscher, MD  SUMAtriptan (IMITREX) 50 MG tablet Take 25 mg by mouth 2 (two) times daily as needed. For migraine headaches. 04/07/17   [provider]    Allergies    Gabapentin  Review of Systems   Review of Systems  Constitutional: Negative for chills and fever.  Musculoskeletal: Positive for arthralgias (Left arm pain ).  Neurological: Negative for weakness and numbness.    Physical Exam Updated Vital Signs BP (!) 172/90 (BP Location: Right Arm)   Pulse 84   Temp 99 F (37.2 C) (Oral)   Resp 20   Ht 4\' 10"  (1.473 m)   Wt 63.5 kg   SpO2 99%   BMI 29.26 kg/m   Physical Exam Vitals and nursing note reviewed.  Constitutional:      General: She is not in acute distress.    Appearance: Normal appearance. She is well-developed. She is not ill-appearing, toxic-appearing or diaphoretic.     Comments: Appears to be in pain   HENT:     Head: Normocephalic and atraumatic.  Eyes:     Conjunctiva/sclera: Conjunctivae normal.  Cardiovascular:     Rate and Rhythm: Normal rate and regular rhythm.     Heart sounds: No murmur heard.   Pulmonary:     Effort: Pulmonary effort is normal. No respiratory distress.     Breath sounds: Normal breath sounds.  Abdominal:     Palpations: Abdomen is soft.     Tenderness: There is no abdominal tenderness.  Musculoskeletal:     Cervical back: Neck supple.     Comments: Left arm exam limited secondary to pain and patient not wanting me to touch her arm. She has significant bruising to her left deltoid/ arm area. Limited ROM 2/2 to pain. 2+ radial pulses, gross sensations intact. Normal ROM of right arm with intact strength, pulses and sensations.   Skin:    General: Skin is warm and dry.  Neurological:     Mental Status: She is alert.      ED Results / Procedures / Treatments   Labs (all labs ordered are listed, but only abnormal results are displayed) Labs Reviewed - No data to display  EKG None  Radiology DG Shoulder Left  Result Date: 07/02/2020 CLINICAL DATA:  LEFT shoulder pain after fall fall EXAM: LEFT SHOULDER - 2+ VIEW COMPARISON:  Radiograph 03/18/2019 FINDINGS: Impaction fracture through the surgical neck of the LEFT humerus. The glenohumeral joint is intact. There is  mild subluxation of the humeral head in relation to the glenoid fossa suggesting hemarthrosis. IMPRESSION: Humeral neck impaction fracture. Hemarthrosis. Electronically Signed   By: Suzy Bouchard M.D.   On: 07/02/2020 15:52    Procedures Procedures (including critical care time)  Medications Ordered in ED Medications  oxyCODONE-acetaminophen (PERCOCET/ROXICET) 5-325 MG per tablet 1 tablet (1 tablet Oral Given 07/02/20 1723)  HYDROmorphone (DILAUDID) injection 2 mg (has no administration in time range)    ED Course  I have reviewed the triage vital signs and the nursing notes.  Pertinent labs & imaging results that were available during my care of the patient were reviewed by me and considered in my medical decision making (see chart for details).    MDM Rules/Calculators/A&P                          Pt presents to the ER 2 days after a fall with humeral neck impaction fracture with hemarthrosis as evident on xrays done by [facility] imaging ordered by her PCP.  No gross sensory deficits.  Strong pulses. Denies head injury or LOC.  No evidence of neurovascular compromise. No evidence of open fracture.  Patient does have significant bruising to her left arm.  Patient was placed in a sling, per PDMP review patient has been receiving 10-325 Percocet per PCP.  Will provide 2mg  Dilaudid injection here in the ED, notes improvement in pain.  Encouraged her to continue to take her prescribed pain medication at home for pain. Pt placed in splint  here in the ED.  No additional pain medication will be provided.  Patient states that she had seen Malena Catholic MD with  [facility] orthopedics previously, would like to follow-up with him.  Referral provided.  Return precautions discussed. Pt voices understanding and is agreeable.  At this stage in ED course, the patient has been medically screened and stable for discharge.   Pt was seen and evaluated by Dr. Regenia Skeeter who is agreeable to the above plan and disposition.     Final Clinical Impression(s) / ED Diagnoses Final diagnoses:  Left supracondylar humerus fracture, closed, initial encounter    Rx / DC Orders ED Discharge Orders    None       Lyndel Safe 07/02/20 2046    Sherwood Gambler, MD 07/03/20 1505

## 2020-07-02 NOTE — Telephone Encounter (Signed)
[facility] called: Marie Burns has an Impaction fracture through the surgical neck of the LEFT humerus.  Call back ph [facility] [phone]   Dr. Ranell Patrick notified.

## 2020-07-02 NOTE — Discharge Instructions (Signed)
Please continue to take your Percocet as prescribed for pain.  Continue to wear the sleeve at all times.  Please make sure to follow-up with Dr. Tamera Punt, please call his office tomorrow morning first thing to schedule an appointment.  Return to the ER if your symptoms worsen.

## 2020-07-02 NOTE — ED Triage Notes (Addendum)
Pt fell in the middle of the night on Tuesday and tried to catch herself with her L arm. Went to [facility] and was sent here for humeral neck fracture.

## 2020-07-03 ENCOUNTER — Other Ambulatory Visit: Payer: Self-pay

## 2020-07-03 ENCOUNTER — Encounter: Payer: Self-pay | Admitting: Physical Medicine and Rehabilitation

## 2020-07-03 ENCOUNTER — Encounter (HOSPITAL_BASED_OUTPATIENT_CLINIC_OR_DEPARTMENT_OTHER): Payer: Medicare HMO | Admitting: Physical Medicine and Rehabilitation

## 2020-07-03 VITALS — Ht <= 58 in | Wt 134.0 lb

## 2020-07-03 DIAGNOSIS — M25512 Pain in left shoulder: Secondary | ICD-10-CM

## 2020-07-03 DIAGNOSIS — S42215A Unspecified nondisplaced fracture of surgical neck of left humerus, initial encounter for closed fracture: Secondary | ICD-10-CM

## 2020-07-03 DIAGNOSIS — M25522 Pain in left elbow: Secondary | ICD-10-CM | POA: Diagnosis not present

## 2020-07-03 DIAGNOSIS — G8929 Other chronic pain: Secondary | ICD-10-CM | POA: Diagnosis not present

## 2020-07-03 MED ORDER — OXYCODONE-ACETAMINOPHEN 10-325 MG PO TABS
1.0000 | ORAL_TABLET | ORAL | 0 refills | Status: DC | PRN
Start: 2020-07-03 — End: 2020-07-16

## 2020-07-03 NOTE — Addendum Note (Signed)
Addended by: Izora Ribas on: 07/03/2020 07:49 PM   Modules accepted: Orders, Level of Service

## 2020-07-03 NOTE — Progress Notes (Addendum)
Subjective:    Patient ID: Marie Burns, female    DOB: July 07, 1946, 74 y.o.   MRN: [id number]  HPI  Due to patient's conflict with another more essential appointment, an audio/video tele-health visit is felt to be the most appropriate encounter for this patient at this time. See MyChart message from today for the patient's consent to a tele-health encounter with [facility]. This is a follow up tele-visit via phone. The patient is at home. MD is at office.   Marie Burns is a 74 year old woman who presents regarding a left humeral fracture she sustained this week after a fall. Pain is incredibly severe. I communicated with her by phone yesterday and ordered an XR of her left shoulder at [facility] on [address]  She was able to get her XR and I have reviewed the result as followed: humeral neck impaction fracture with hemarthrosis. I advised her to go to the ED. She tells me today that she received bilateral arm injections for pain relief in the ED. She cannot recall the name of the medication.   She says she was not seen by an orthopedist and was sent home without any pain medication given that she is under pain contract with me and should have enough of her Percocet left given that she received her last refill on 06/19/20 ([facility] [facility] reviewed).   She was able to call the orthopedist who did her right shoulder replacement. I advised her to try to get an appointment today as possible. She was able to do so at 10am so I advised her to go to this instead of come in for our appointment, and hence our phone appointment in place today.   Pain Inventory Average Pain 10 Pain Right Now 10 My pain is constant, sharp, burning, tingling and aching  In the last 24 hours, has pain interfered with the following? General activity 9 Relation with others 10 Enjoyment of life 10 What TIME of day is your pain at its worst? All the time. Sleep (in general)  Poor  Pain is worse with: walking, bending, sitting and standing Pain improves with: medication Relief from Meds: 4  Mobility walk with assistance use a cane how many minutes can you walk? 20-30 mins ability to climb steps?  yes do you drive?  no  Function disabled: date disabled "A long time ago." I need assistance with the following:  household duties and shopping  Neuro/Psych bowel control problems numbness tingling trouble walking depression anxiety  Prior Studies Any changes since last visit?  no  Physicians involved in your care Any changes since last visit?  no   No family history on file. Social History   Socioeconomic History  . Marital status: Divorced    Spouse name: Not on file  . Number of children: Not on file  . Years of education: Not on file  . Highest education level: Not on file  Occupational History  . Not on file  Tobacco Use  . Smoking status: Never Smoker  . Smokeless tobacco: Never Used  Vaping Use  . Vaping Use: Never used  Substance and Sexual Activity  . Alcohol use: No  . Drug use: No  . Sexual activity: Not on file  Other Topics Concern  . Not on file  Social History Narrative  . Not on file   Social Determinants of Health   Financial Resource Strain:   . Difficulty of Paying Living Expenses: Not on file  Food  Insecurity:   . Worried About Charity fundraiser in the Last Year: Not on file  . Ran Out of Food in the Last Year: Not on file  Transportation Needs:   . Lack of Transportation (Medical): Not on file  . Lack of Transportation (Non-Medical): Not on file  Physical Activity:   . Days of Exercise per Week: Not on file  . Minutes of Exercise per Session: Not on file  Stress:   . Feeling of Stress : Not on file  Social Connections:   . Frequency of Communication with Friends and Family: Not on file  . Frequency of Social Gatherings with Friends and Family: Not on file  . Attends Religious Services: Not on file   . Active Member of Clubs or Organizations: Not on file  . Attends Archivist Meetings: Not on file  . Marital Status: Not on file   Past Surgical History:  Procedure Laterality Date  . APPENDECTOMY    . BREAST SURGERY Left    lymphnodes removed in the left arm X2  . REVERSE SHOULDER ARTHROPLASTY Right 05/25/2017  . REVERSE SHOULDER ARTHROPLASTY Right 05/25/2017   Procedure: REVERSE SHOULDER ARTHROPLASTY;  Surgeon: Tania Ade, MD;  Location: [facility];  Service: Orthopedics;  Laterality: Right;  RIGHT REVERSE TOTAL SHOULDER ARTHROPLASTY   Past Medical History:  Diagnosis Date  . Arthritis    right shoulder  . Complication of anesthesia    hard to wake up  . History of kidney stones   . Hypertension   . Hypothyroidism   . Pneumonia    history  . RSD upper limb    left   . Skin cancer    chest   Ht 4\' 10"  (1.473 m)   Wt 134 lb (60.8 kg)   BMI 28.01 kg/m   Opioid Risk Score:   Fall Risk Score:  `1  Depression screen PHQ 2/9  Depression screen PHQ 2/9 05/20/2020  Decreased Interest 0  Down, Depressed, Hopeless 0  PHQ - 2 Score 0    Review of Systems  Constitutional: Negative.   HENT: Negative.   Eyes: Negative.   Respiratory: Negative.   Cardiovascular: Positive for leg swelling.  Gastrointestinal: Negative.   Endocrine: Negative.   Genitourinary: Positive for urgency.  Musculoskeletal: Positive for back pain and gait problem.  Skin: Negative.   Allergic/Immunologic: Negative.   Neurological: Positive for numbness.       Tingling  Hematological: Negative.   Psychiatric/Behavioral:       Anxiety, Depression  All other systems reviewed and are negative.      Objective:   Physical Exam Not performed as patient was seen via phone.      Assessment & Plan:  Marie Burns is a 74 year old woman who presents regarding left humeral fracture following fall this week.  -XR reviewed and shows humeral neck impaction fracture with hemarthrosis.    -Advised patient to go to ED yesterday and she was not seen by an orthopedist there despite extensive bruising down her arm and to her breast, per her report. Advised her to call her orthopedist who did her right shoulder surgery and she was able to schedule appointment with him today.  -Advised her to wear sling at all times.   -Apply ice 2-3 times per day to reduce swelling.  -She has a few pills of Norco left- has been taking more than prescribed due to the severity of her pain from the fracture. Advised to let me know what  orthopedist discusses with her today. She may require surgery. If not, I can send her additional pain medication due to the severity of her fracture. PDMP reviewed today. She did not receive narcotic prescription from ED.   Addendum: Mrs. Winterton called me today evening to discuss her ortho appointment. She was told she is not in need of emergent surgery and prescribed 2 weeks of therapy. I am not sure she will be able to tolerate this therapy given her level of pain and if therapy will worsen the healing of her fracture. She has follow-up appointment in 2 weeks with the orthopedic who did her prior surgery; saw his colleague today. Given her severe pain from the fracture she has been using Percocet in greater quantity than I prescribed. She has been advising me of this. I discussed that I will provide 108 pills today to last for 18 days until her follow-up appointment with me on 9/7. I advised that this is a special circumstance given her fracture and that I will not be able to do this again. Mrs. Shippey expressed understanding.   All questions answered. 40 minutes spent in discussion with patient regarding her pain, what was done for her in the ED, discussing the importance of immediate orthopedic follow-up, education regarding sling application, education regarding adjunctive pain interventions to minimize narcotic use, discussion of orthopedic plan.

## 2020-07-06 ENCOUNTER — Other Ambulatory Visit: Payer: Self-pay | Admitting: Physical Medicine and Rehabilitation

## 2020-07-09 DIAGNOSIS — S42309S Unspecified fracture of shaft of humerus, unspecified arm, sequela: Secondary | ICD-10-CM | POA: Diagnosis not present

## 2020-07-09 DIAGNOSIS — M25512 Pain in left shoulder: Secondary | ICD-10-CM | POA: Diagnosis not present

## 2020-07-16 ENCOUNTER — Other Ambulatory Visit: Payer: Self-pay | Admitting: Physical Medicine and Rehabilitation

## 2020-07-16 MED ORDER — OXYCODONE-ACETAMINOPHEN 10-325 MG PO TABS: ORAL_TABLET | ORAL | 0 refills | Status: DC

## 2020-07-17 DIAGNOSIS — S42202A Unspecified fracture of upper end of left humerus, initial encounter for closed fracture: Secondary | ICD-10-CM | POA: Diagnosis not present

## 2020-07-17 DIAGNOSIS — M25512 Pain in left shoulder: Secondary | ICD-10-CM | POA: Diagnosis not present

## 2020-07-21 ENCOUNTER — Ambulatory Visit: Payer: Medicare HMO | Admitting: Physical Medicine and Rehabilitation

## 2020-07-21 ENCOUNTER — Other Ambulatory Visit: Payer: Self-pay | Admitting: Physical Medicine and Rehabilitation

## 2020-07-21 MED ORDER — METHYLPREDNISOLONE 4 MG PO TBPK: ORAL_TABLET | ORAL | 0 refills | Status: DC

## 2020-07-23 ENCOUNTER — Ambulatory Visit: Payer: Medicare HMO | Admitting: Physical Medicine and Rehabilitation

## 2020-07-24 DIAGNOSIS — G894 Chronic pain syndrome: Secondary | ICD-10-CM | POA: Diagnosis not present

## 2020-08-04 ENCOUNTER — Other Ambulatory Visit: Payer: Self-pay

## 2020-08-05 ENCOUNTER — Encounter: Payer: Self-pay | Admitting: Physical Medicine and Rehabilitation

## 2020-08-05 ENCOUNTER — Encounter: Payer: Medicare HMO | Attending: Physical Medicine and Rehabilitation | Admitting: Physical Medicine and Rehabilitation

## 2020-08-05 ENCOUNTER — Other Ambulatory Visit: Payer: Self-pay

## 2020-08-05 VITALS — BP 134/76 | Temp 99.3°F | Ht <= 58 in | Wt 134.0 lb

## 2020-08-05 DIAGNOSIS — K5903 Drug induced constipation: Secondary | ICD-10-CM | POA: Insufficient documentation

## 2020-08-05 DIAGNOSIS — M48062 Spinal stenosis, lumbar region with neurogenic claudication: Secondary | ICD-10-CM | POA: Diagnosis not present

## 2020-08-05 DIAGNOSIS — S42215A Unspecified nondisplaced fracture of surgical neck of left humerus, initial encounter for closed fracture: Secondary | ICD-10-CM

## 2020-08-05 DIAGNOSIS — G8929 Other chronic pain: Secondary | ICD-10-CM | POA: Diagnosis not present

## 2020-08-05 DIAGNOSIS — G894 Chronic pain syndrome: Secondary | ICD-10-CM

## 2020-08-05 DIAGNOSIS — R600 Localized edema: Secondary | ICD-10-CM | POA: Diagnosis not present

## 2020-08-05 DIAGNOSIS — M25512 Pain in left shoulder: Secondary | ICD-10-CM | POA: Diagnosis not present

## 2020-08-05 DIAGNOSIS — M79605 Pain in left leg: Secondary | ICD-10-CM | POA: Insufficient documentation

## 2020-08-05 DIAGNOSIS — Z5181 Encounter for therapeutic drug level monitoring: Secondary | ICD-10-CM | POA: Insufficient documentation

## 2020-08-05 MED ORDER — OXYCODONE-ACETAMINOPHEN 10-325 MG PO TABS
1.0000 | ORAL_TABLET | Freq: Every day | ORAL | 0 refills | Status: DC | PRN
Start: 2020-08-05 — End: 2020-09-03

## 2020-08-05 MED ORDER — OXYCODONE-ACETAMINOPHEN 10-325 MG PO TABS
1.0000 | ORAL_TABLET | ORAL | 0 refills | Status: DC | PRN
Start: 2020-08-05 — End: 2020-08-05

## 2020-08-05 NOTE — Progress Notes (Signed)
Subjective:    Patient ID: Marie Burns, female    DOB: 1946-02-14, 74 y.o.   MRN: [id number]  HPI  Due to national recommendations of social distancing because of COVID 40, an audio/video tele-health visit is felt to be the most appropriate encounter for this patient at this time given her elevated temperature. See MyChart message from today for the patient's consent to a tele-health encounter with [facility]. This is a follow up tele-visit via phone. The patient is at home. MD is at office.   Marie Burns is a 74 year old woman who presents regarding a left humeral fracture.  She followed up with surgery. He plans to continue surgery at this time. Her daughter has been helping. She continues to wear the sling  And use it about 5 minutes per day. She has been slowly tapering up on her oxycodone back to her prior dose.   She also continues to have her sciatic back pain.  Addendum:  She went to pick up the refill I prescribed for her today at the pharmacy and it was denied by her insurance company due to being over MME limit despite it being the same dose I have sent her previously Out of pocket cost would be $400. I spoke with pharmacist and patient again by phone to discuss this. Decided with patient to trial lower dose of 5pills per day PRN  Prior history:  XR shows humeral neck impaction fracture with hemarthrosis. I advised her to go to the ED. She tells me today that she received bilateral arm injections for pain relief in the ED. She cannot recall the name of the medication.   She says she was not seen by an orthopedist and was sent home without any pain medication given that she is under pain contract with me and should have enough of her Percocet left given that she received her last refill on 06/19/20 ([facility] reviewed).   She was able to call the orthopedist who did her right shoulder replacement. I advised her to try to get an appointment today  as possible. She was able to do so at 10am so I advised her to go to this instead of come in for our appointment, and hence our phone appointment in place today.   Pain Inventory Average Pain 9 Pain Right Now 9 My pain is constant, sharp, burning, tingling and aching  In the last 24 hours, has pain interfered with the following? General activity 7 Relation with others 9 Enjoyment of life 9 What TIME of day is your pain at its worst? All the time. Sleep (in general) Fair  Pain is worse with: walking, bending, standing and some activites Pain improves with: medication Relief from Meds: 8       History reviewed. No pertinent family history. Social History   Socioeconomic History  . Marital status: Divorced    Spouse name: Not on file  . Number of children: Not on file  . Years of education: Not on file  . Highest education level: Not on file  Occupational History  . Not on file  Tobacco Use  . Smoking status: Never Smoker  . Smokeless tobacco: Never Used  Vaping Use  . Vaping Use: Never used  Substance and Sexual Activity  . Alcohol use: No  . Drug use: No  . Sexual activity: Not on file  Other Topics Concern  . Not on file  Social History Narrative  . Not on file  Social Determinants of Health   Financial Resource Strain:   . Difficulty of Paying Living Expenses: Not on file  Food Insecurity:   . Worried About Charity fundraiser in the Last Year: Not on file  . Ran Out of Food in the Last Year: Not on file  Transportation Needs:   . Lack of Transportation (Medical): Not on file  . Lack of Transportation (Non-Medical): Not on file  Physical Activity:   . Days of Exercise per Week: Not on file  . Minutes of Exercise per Session: Not on file  Stress:   . Feeling of Stress : Not on file  Social Connections:   . Frequency of Communication with Friends and Family: Not on file  . Frequency of Social Gatherings with Friends and Family: Not on file  . Attends  Religious Services: Not on file  . Active Member of Clubs or Organizations: Not on file  . Attends Archivist Meetings: Not on file  . Marital Status: Not on file   Past Surgical History:  Procedure Laterality Date  . APPENDECTOMY    . BREAST SURGERY Left    lymphnodes removed in the left arm X2  . REVERSE SHOULDER ARTHROPLASTY Right 05/25/2017  . REVERSE SHOULDER ARTHROPLASTY Right 05/25/2017   Procedure: REVERSE SHOULDER ARTHROPLASTY;  Surgeon: Tania Ade, MD;  Location: [facility];  Service: Orthopedics;  Laterality: Right;  RIGHT REVERSE TOTAL SHOULDER ARTHROPLASTY   Past Medical History:  Diagnosis Date  . Arthritis    right shoulder  . Complication of anesthesia    hard to wake up  . History of kidney stones   . Hypertension   . Hypothyroidism   . Pneumonia    history  . RSD upper limb    left   . Skin cancer    chest   There were no vitals taken for this visit.  Opioid Risk Score:   Fall Risk Score:  `1  Depression screen PHQ 2/9  Depression screen PHQ 2/9 05/20/2020  Decreased Interest 0  Down, Depressed, Hopeless 0  PHQ - 2 Score 0    Review of Systems  Constitutional: Negative.   HENT: Negative.   Eyes: Negative.   Respiratory: Negative.   Cardiovascular: Positive for leg swelling.  Gastrointestinal: Negative.   Endocrine: Negative.   Genitourinary: Positive for urgency.  Musculoskeletal: Positive for back pain and gait problem.  Skin: Negative.   Allergic/Immunologic: Negative.   Neurological: Positive for numbness.       Tingling  Hematological: Negative.   Psychiatric/Behavioral:       Anxiety, Depression  All other systems reviewed and are negative.      Objective:   Physical Exam Not performed as patient was seen via phone.      Assessment & Plan:  Marie Burns is a 74 year old woman who presents regarding left humeral fracture following a fall.  -She has followed up with surgery and he has recommended conservative  treatment: use of sling for the majority of the day, with 5 minutes of rest for exercise.   -XR reviewed and shows humeral neck impaction fracture with hemarthrosis.   -She has been receiving assistance from her daughter for ADLs.   -Apply ice 2-3 times per day to reduce swelling.  -Provided refill of her percocet 7.5mg  up to 5 pills per day.   20 minutes spent in discussion of humeral fracture, pain medication, conservative management, ability to perform ADLs, discussing with patient later in the day by phone  regarding her pharmacy not dispensing the medication, speaking with pharamacist, providing new script at lower [facility] as per insurance requirements for coverage, and discussion with patient again.

## 2020-08-06 ENCOUNTER — Telehealth: Payer: Self-pay

## 2020-08-06 NOTE — Telephone Encounter (Signed)
[facility] called to confirm qty on Percocet. While researching note on 08/05/2020 note *Provided refill of her percocet 7.5mg  up to 5 pills per day. In med list its Percocet 10/325mg . Which strength is correct?

## 2020-08-07 NOTE — Telephone Encounter (Signed)
If you could let them know that the 10mg  dose is correct, that would be great, thank you!

## 2020-08-07 NOTE — Telephone Encounter (Signed)
Left message for the pharmacy.

## 2020-08-19 DIAGNOSIS — S42202D Unspecified fracture of upper end of left humerus, subsequent encounter for fracture with routine healing: Secondary | ICD-10-CM | POA: Diagnosis not present

## 2020-09-03 ENCOUNTER — Other Ambulatory Visit (HOSPITAL_COMMUNITY): Payer: Self-pay | Admitting: Physical Medicine and Rehabilitation

## 2020-09-03 MED ORDER — OXYCODONE-ACETAMINOPHEN 10-325 MG PO TABS
1.0000 | ORAL_TABLET | Freq: Every day | ORAL | 0 refills | Status: DC | PRN
Start: 2020-09-03 — End: 2020-09-29

## 2020-09-09 ENCOUNTER — Other Ambulatory Visit: Payer: Self-pay

## 2020-09-09 ENCOUNTER — Encounter: Payer: Medicare HMO | Attending: Physical Medicine and Rehabilitation | Admitting: Physical Medicine and Rehabilitation

## 2020-09-09 ENCOUNTER — Encounter: Payer: Self-pay | Admitting: Physical Medicine and Rehabilitation

## 2020-09-09 VITALS — BP 132/83 | HR 107 | Temp 98.9°F | Ht <= 58 in | Wt 131.2 lb

## 2020-09-09 DIAGNOSIS — G894 Chronic pain syndrome: Secondary | ICD-10-CM | POA: Insufficient documentation

## 2020-09-09 DIAGNOSIS — Z79891 Long term (current) use of opiate analgesic: Secondary | ICD-10-CM | POA: Insufficient documentation

## 2020-09-09 DIAGNOSIS — M4316 Spondylolisthesis, lumbar region: Secondary | ICD-10-CM

## 2020-09-09 DIAGNOSIS — S42215A Unspecified nondisplaced fracture of surgical neck of left humerus, initial encounter for closed fracture: Secondary | ICD-10-CM | POA: Diagnosis not present

## 2020-09-09 DIAGNOSIS — Z5181 Encounter for therapeutic drug level monitoring: Secondary | ICD-10-CM | POA: Diagnosis not present

## 2020-09-09 NOTE — Progress Notes (Signed)
Subjective:    Patient ID: Marie Burns, female    DOB: September 03, 1946, 74 y.o.   MRN: [id number]  HPI  Marie Burns is a 74 year old woman who presents regarding a left humeral fracture.  She has been having very bad pain in her left shoulder and her back. She has been taking the oxycodone up to 5 times and recently received a refill. She is due for a swab today.   Discussed injections and she will consider this for her back.   She went on a trip this weekend and she is wondering if this aggravated the pain. She had to pretend to enjoy the trip despite the pain.  She has followed with surgery for her left humerus fracture and has been given a home exercise program.    Prior history:  XR shows humeral neck impaction fracture with hemarthrosis. I advised her to go to the ED. She tells me today that she received bilateral arm injections for pain relief in the ED. She cannot recall the name of the medication.   She says she was not seen by an orthopedist and was sent home without any pain medication given that she is under pain contract with me and should have enough of her Percocet left given that she received her last refill on 06/19/20 ([facility] reviewed).   She was able to call the orthopedist who did her right shoulder replacement. I advised her to try to get an appointment today as possible. She was able to do so at 10am so I advised her to go to this instead of come in for our appointment, and hence our phone appointment in place today.   Pain Inventory Average Pain 9 Pain Right Now 10 My pain is constant, sharp, burning, tingling and aching  In the last 24 hours, has pain interfered with the following? General activity 9 Relation with others 9 Enjoyment of life 10 What TIME of day is your pain at its worst? All the time. Sleep (in general) Poor  Pain is worse with: walking Pain improves with: medication Relief from Meds: 8   No family history on file. Social History    Socioeconomic History  . Marital status: Divorced    Spouse name: Not on file  . Number of children: Not on file  . Years of education: Not on file  . Highest education level: Not on file  Occupational History  . Not on file  Tobacco Use  . Smoking status: Never Smoker  . Smokeless tobacco: Never Used  Vaping Use  . Vaping Use: Never used  Substance and Sexual Activity  . Alcohol use: No  . Drug use: No  . Sexual activity: Not on file  Other Topics Concern  . Not on file  Social History Narrative  . Not on file   Social Determinants of Health   Financial Resource Strain:   . Difficulty of Paying Living Expenses: Not on file  Food Insecurity:   . Worried About Charity fundraiser in the Last Year: Not on file  . Ran Out of Food in the Last Year: Not on file  Transportation Needs:   . Lack of Transportation (Medical): Not on file  . Lack of Transportation (Non-Medical): Not on file  Physical Activity:   . Days of Exercise per Week: Not on file  . Minutes of Exercise per Session: Not on file  Stress:   . Feeling of Stress : Not on file  Social Connections:   .  Frequency of Communication with Friends and Family: Not on file  . Frequency of Social Gatherings with Friends and Family: Not on file  . Attends Religious Services: Not on file  . Active Member of Clubs or Organizations: Not on file  . Attends Archivist Meetings: Not on file  . Marital Status: Not on file   Past Surgical History:  Procedure Laterality Date  . APPENDECTOMY    . BREAST SURGERY Left    lymphnodes removed in the left arm X2  . REVERSE SHOULDER ARTHROPLASTY Right 05/25/2017  . REVERSE SHOULDER ARTHROPLASTY Right 05/25/2017   Procedure: REVERSE SHOULDER ARTHROPLASTY;  Surgeon: Tania Ade, MD;  Location: [facility];  Service: Orthopedics;  Laterality: Right;  RIGHT REVERSE TOTAL SHOULDER ARTHROPLASTY   Past Medical History:  Diagnosis Date  . Arthritis    right shoulder  .  Complication of anesthesia    hard to wake up  . History of kidney stones   . Hypertension   . Hypothyroidism   . Pneumonia    history  . RSD upper limb    left   . Skin cancer    chest   There were no vitals taken for this visit.  Opioid Risk Score:   Fall Risk Score:  `1  Depression screen PHQ 2/9  Depression screen PHQ 2/9 05/20/2020  Decreased Interest 0  Down, Depressed, Hopeless 0  PHQ - 2 Score 0    Review of Systems  Constitutional: Negative.   HENT: Negative.   Eyes: Negative.   Respiratory: Negative.   Cardiovascular: Positive for leg swelling.  Gastrointestinal: Negative.   Endocrine: Negative.   Genitourinary: Positive for urgency.  Musculoskeletal: Positive for back pain and gait problem.  Skin: Negative.   Allergic/Immunologic: Negative.   Neurological: Positive for numbness.       Tingling  Hematological: Negative.   Psychiatric/Behavioral:       Anxiety, Depression  All other systems reviewed and are negative.      Objective:   Physical Exam Gen: no distress, normal appearing HEENT: oral mucosa pink and moist, NCAT Cardio: Reg rate Chest: normal effort, normal rate of breathing Abd: soft, non-distended Ext: no edema Skin: intact Neuro: Alert Musculoskeletal: Left arm in sling.  Psych: pleasant, normal affect     Assessment & Plan:  Marie Burns is a 74 year old woman who presents regarding left humeral fracture following a fall, as well as spinal stenosis.   1) Left humeral fracture:  -She has followed up with surgery and he has recommended conservative treatment: use sling as needed for comfort, home exercises, healing well.   -XR reviewed and shows humeral neck impaction fracture with hemarthrosis.   -She has been receiving assistance from her daughter for ADLs.   -Encouraged ice 2-3 times per day to reduce swelling.  2) Lumbar anterolisthesis: Lumbar MRI 07/2019:  IMPRESSION: 1. 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 2. No significant interval progression of multilevel lumbar spondylosis, most pronounced at the L3-4 level where there is severe stenosis of the bilateral subarticular recesses and severe canal stenosis. 3. Grade 1 anterolisthesis L3 on L4 and L4 on L5. -Provided refill of her percocet 10mg  up to 5 pills per day.  -Nasal swab today, routine.  -Advised turmeric daily with black pepper.  -Discussed ESI as an option for her pain, she will let me know if she wants to do this.  -Encouraged hot peppers.

## 2020-09-13 LAB — DRUG TOX MONITOR 1 W/CONF, ORAL FLD
Amphetamines: NEGATIVE ng/mL (ref <10)
Barbiturates: NEGATIVE ng/mL (ref <10)
Benzodiazepines: NEGATIVE ng/mL (ref <0.50)
Buprenorphine: NEGATIVE ng/mL (ref <0.10)
Cocaine: NEGATIVE ng/mL (ref <5.0)
Codeine: NEGATIVE ng/mL (ref <2.5)
Dihydrocodeine: NEGATIVE ng/mL (ref <2.5)
Fentanyl: NEGATIVE ng/mL (ref <0.10)
Heroin Metabolite: NEGATIVE ng/mL (ref <1.0)
Hydrocodone: NEGATIVE ng/mL (ref <2.5)
Hydromorphone: NEGATIVE ng/mL (ref <2.5)
MARIJUANA: NEGATIVE ng/mL (ref <2.5)
MDMA: NEGATIVE ng/mL (ref <10)
Meprobamate: NEGATIVE ng/mL (ref <2.5)
Methadone: NEGATIVE ng/mL (ref <5.0)
Morphine: NEGATIVE ng/mL (ref <2.5)
Nicotine Metabolite: NEGATIVE ng/mL (ref <5.0)
Norhydrocodone: NEGATIVE ng/mL (ref <2.5)
Noroxycodone: 27.7 ng/mL — ABNORMAL HIGH (ref <2.5)
Opiates: POSITIVE ng/mL — AB (ref <2.5)
Oxycodone: >250 ng/mL — ABNORMAL HIGH (ref <2.5)
Oxymorphone: NEGATIVE ng/mL (ref <2.5)
Phencyclidine: NEGATIVE ng/mL (ref <10)
Tapentadol: NEGATIVE ng/mL (ref <5.0)
Tramadol: NEGATIVE ng/mL (ref <5.0)
Zolpidem: NEGATIVE ng/mL (ref <5.0)

## 2020-09-13 LAB — DRUG TOX ALC METAB W/CON, ORAL FLD: Alcohol Metabolite: NEGATIVE ng/mL (ref <25)

## 2020-09-14 ENCOUNTER — Telehealth: Payer: Self-pay | Admitting: *Deleted

## 2020-09-14 NOTE — Telephone Encounter (Signed)
Oral swab drug screen was consistent for prescribed medications.  ?

## 2020-09-29 ENCOUNTER — Other Ambulatory Visit: Payer: Self-pay | Admitting: Physical Medicine and Rehabilitation

## 2020-09-29 MED ORDER — OXYCODONE-ACETAMINOPHEN 10-325 MG PO TABS: 1.0000 | ORAL_TABLET | Freq: Every day | ORAL | 0 refills | Status: DC | PRN

## 2020-10-05 DIAGNOSIS — Z23 Encounter for immunization: Secondary | ICD-10-CM | POA: Diagnosis not present

## 2020-10-05 DIAGNOSIS — S42202D Unspecified fracture of upper end of left humerus, subsequent encounter for fracture with routine healing: Secondary | ICD-10-CM | POA: Diagnosis not present

## 2020-10-06 ENCOUNTER — Ambulatory Visit: Payer: Medicare HMO | Admitting: Physical Medicine and Rehabilitation

## 2020-10-14 ENCOUNTER — Encounter: Payer: Self-pay | Admitting: Physical Medicine and Rehabilitation

## 2020-10-14 ENCOUNTER — Encounter: Payer: Medicare HMO | Attending: Physical Medicine and Rehabilitation | Admitting: Physical Medicine and Rehabilitation

## 2020-10-14 ENCOUNTER — Other Ambulatory Visit: Payer: Self-pay

## 2020-10-14 ENCOUNTER — Ambulatory Visit: Payer: Medicare HMO | Admitting: Physical Medicine and Rehabilitation

## 2020-10-14 VITALS — BP 93/63 | HR 72 | Temp 98.8°F | Ht <= 58 in | Wt 129.6 lb

## 2020-10-14 DIAGNOSIS — S42215A Unspecified nondisplaced fracture of surgical neck of left humerus, initial encounter for closed fracture: Secondary | ICD-10-CM | POA: Diagnosis not present

## 2020-10-14 DIAGNOSIS — M48062 Spinal stenosis, lumbar region with neurogenic claudication: Secondary | ICD-10-CM | POA: Insufficient documentation

## 2020-10-14 MED ORDER — GABAPENTIN 400 MG PO CAPS
400.0000 mg | ORAL_CAPSULE | Freq: Three times a day (TID) | ORAL | 3 refills | Status: DC
Start: 2020-10-14 — End: 2020-12-24

## 2020-10-14 MED ORDER — METHYLPREDNISOLONE 4 MG PO TBPK
ORAL_TABLET | ORAL | 0 refills | Status: DC
Start: 2020-10-14 — End: 2020-11-18
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
## 2020-10-14 NOTE — Patient Instructions (Addendum)
[place] nuts, almonds, walnuts, pistachios, peanuts   -Discussed following foods that may reduce pain: 1) Ginger 2) Blueberries 3) Salmon 4) Pumpkin seeds 5) dark chocolate 6) turmeric 7) tart cherries 8) virgin olive oil 9) chilli peppers 10) mint 11) red wine   Turmeric to reduce inflammation--can be used in cooking or taken as a supplement.  Benefits of turmeric:  -Highly anti-inflammatory  -Increases antioxidants  -Improves memory, attention, brain disease  -Lowers risk of heart disease  -May help prevent cancer  -Decreases pain  -Alleviates depression  -Delays aging and decreases risk of chronic disease  -Consume with black pepper to increase absorption    Turmeric Milk Recipe:  1 cup milk  1 tsp turmeric  1 tsp cinnamon  1 tsp grated ginger (optional)  Black pepper (boosts the anti-inflammatory properties of turmeric).  1 tsp honey   Benefits of Ghee  -can be used in cooking, high smoke point  -high in fat soluble vitamins A, D, E, and K which are important for skin and vision, preventing leaky gut, strong bones  -free of lactose and casein  -contains conjugated linoleic acid, which can reduce body fat, prevent cancer, decrease inflammation, and lower blood pressure  -high in butyrate- helps support healthy insulin levels, decreases inflammation, decreases digestive problems, maintains healthy gut microbiome  -decreases pain and inflammation

## 2020-10-30 ENCOUNTER — Other Ambulatory Visit: Payer: Self-pay | Admitting: Physical Medicine and Rehabilitation

## 2020-10-30 MED ORDER — OXYCODONE-ACETAMINOPHEN 10-325 MG PO TABS
1.0000 | ORAL_TABLET | Freq: Every day | ORAL | 0 refills | Status: DC | PRN
Start: 2020-10-30 — End: 2020-11-18

## 2020-11-11 ENCOUNTER — Ambulatory Visit: Payer: Medicare HMO | Admitting: Physical Medicine and Rehabilitation

## 2020-11-18 ENCOUNTER — Encounter: Payer: Medicare HMO | Attending: Physical Medicine and Rehabilitation | Admitting: Physical Medicine and Rehabilitation

## 2020-11-18 ENCOUNTER — Encounter: Payer: Self-pay | Admitting: Physical Medicine and Rehabilitation

## 2020-11-18 ENCOUNTER — Other Ambulatory Visit: Payer: Self-pay

## 2020-11-18 VITALS — BP 169/82 | HR 64 | Temp 99.0°F | Ht <= 58 in | Wt 130.0 lb

## 2020-11-18 DIAGNOSIS — M4316 Spondylolisthesis, lumbar region: Secondary | ICD-10-CM | POA: Diagnosis not present

## 2020-11-18 DIAGNOSIS — M48062 Spinal stenosis, lumbar region with neurogenic claudication: Secondary | ICD-10-CM | POA: Insufficient documentation

## 2020-11-18 DIAGNOSIS — S42215A Unspecified nondisplaced fracture of surgical neck of left humerus, initial encounter for closed fracture: Secondary | ICD-10-CM

## 2020-11-18 DIAGNOSIS — R600 Localized edema: Secondary | ICD-10-CM | POA: Insufficient documentation

## 2020-11-18 DIAGNOSIS — G894 Chronic pain syndrome: Secondary | ICD-10-CM

## 2020-11-18 MED ORDER — TAPENTADOL HCL 50 MG PO TABS
50.0000 mg | ORAL_TABLET | Freq: Every day | ORAL | 0 refills | Status: DC
Start: 2020-11-18 — End: 2020-12-29

## 2020-11-18 MED ORDER — OXYCODONE-ACETAMINOPHEN 10-325 MG PO TABS
1.0000 | ORAL_TABLET | Freq: Four times a day (QID) | ORAL | 0 refills | Status: DC | PRN
Start: 2020-11-18 — End: 2020-11-27

## 2020-11-18 MED ORDER — OXYCODONE-ACETAMINOPHEN 10-325 MG PO TABS
1.0000 | ORAL_TABLET | Freq: Four times a day (QID) | ORAL | 0 refills | Status: DC | PRN
Start: 2020-11-18 — End: 2020-11-18

## 2020-11-18 MED ORDER — TAPENTADOL HCL 50 MG PO TABS
50.0000 mg | ORAL_TABLET | Freq: Every day | ORAL | 0 refills | Status: DC
Start: 2020-11-18 — End: 2020-11-18

## 2020-11-18 NOTE — Progress Notes (Signed)
Subjective:    Patient ID: Marie Burns, female    DOB: [birth year], 75 y.o.   MRN: [id number]  Marie Burns is a 75 year old woman who presents for follow-up of left humeral fracture.  1) Left humeral fracture: She has an XR scheduled next month. She was told that the way the bone is broken it is going to take a long time to heal. She takes five Percocet per day.   2) Neuropathic pain in her left leg> right: She has been taking Gabapentin at night. It makes her too sleepy during the day to take the Gabapentin. She has had headache with Cymbalta. Discussed Nucynta as an option.   3) Lower extremity swelling: much improved.    Prior history:  XR shows humeral neck impaction fracture with hemarthrosis. I advised her to go to the ED. She tells me today that she received bilateral arm injections for pain relief in the ED. She cannot recall the name of the medication.   She says she was not seen by an orthopedist and was sent home without any pain medication given that she is under pain contract with me and should have enough of her Percocet left given that she received her last refill on 06/19/20 ([facility] reviewed).   She was able to call the orthopedist who did her right shoulder replacement. I advised her to try to get an appointment today as possible. She was able to do so at 10am so I advised her to go to this instead of come in for our appointment, and hence our phone appointment in place today.   Pain Inventory Average Pain 9 Pain Right Now 9 My pain is constant, sharp, burning and aching  In the last 24 hours, has pain interfered with the following? General activity 10 Relation with others 10 Enjoyment of life 10 What TIME of day is your pain at its worst? All the time. Sleep (in general) Poor  Pain is worse with: walking, bending, sitting, standing and some activites Pain improves with: heat/ice and medication Relief from Meds: 5   History reviewed. No pertinent family  history. Social History   Socioeconomic History  . Marital status: Divorced    Spouse name: Not on file  . Number of children: Not on file  . Years of education: Not on file  . Highest education level: Not on file  Occupational History  . Not on file  Tobacco Use  . Smoking status: Never Smoker  . Smokeless tobacco: Never Used  Vaping Use  . Vaping Use: Never used  Substance and Sexual Activity  . Alcohol use: No  . Drug use: No  . Sexual activity: Not on file  Other Topics Concern  . Not on file  Social History Narrative  . Not on file   Social Determinants of Health   Financial Resource Strain: Not on file  Food Insecurity: Not on file  Transportation Needs: Not on file  Physical Activity: Not on file  Stress: Not on file  Social Connections: Not on file   Past Surgical History:  Procedure Laterality Date  . APPENDECTOMY    . BREAST SURGERY Left    lymphnodes removed in the left arm X2  . REVERSE SHOULDER ARTHROPLASTY Right 05/25/2017  . REVERSE SHOULDER ARTHROPLASTY Right 05/25/2017   Procedure: REVERSE SHOULDER ARTHROPLASTY;  Surgeon: Tania Ade, MD;  Location: [facility];  Service: Orthopedics;  Laterality: Right;  RIGHT REVERSE TOTAL SHOULDER ARTHROPLASTY   Past Medical History:  Diagnosis Date  .  Arthritis    right shoulder  . Complication of anesthesia    hard to wake up  . History of kidney stones   . Hypertension   . Hypothyroidism   . Pneumonia    history  . RSD upper limb    left   . Skin cancer    chest   BP (!) 169/82   Pulse 64   Temp 99 F (37.2 C)   Ht 4\' 10"  (1.473 m)   Wt 130 lb (59 kg)   SpO2 97%   BMI 27.17 kg/m   Opioid Risk Score:   Fall Risk Score:  `1  Depression screen PHQ 2/9  Depression screen [facility] 2/9 11/18/2020 05/20/2020  Decreased Interest 0 0  Down, Depressed, Hopeless 0 0  PHQ - 2 Score 0 0    Review of Systems  Constitutional: Negative.   HENT: Negative.   Eyes: Negative.   Respiratory: Negative.    Cardiovascular: Positive for leg swelling.  Gastrointestinal: Negative.   Endocrine: Negative.   Genitourinary: Positive for urgency.  Musculoskeletal: Positive for back pain and gait problem.  Skin: Negative.   Allergic/Immunologic: Negative.   Neurological: Positive for numbness.       Tingling  Hematological: Negative.   Psychiatric/Behavioral:       Anxiety, Depression  All other systems reviewed and are negative.      Objective:   Physical Exam Gen: no distress, normal appearing HEENT: oral mucosa pink and moist, NCAT Cardio: Reg rate Chest: normal effort, normal rate of breathing Abd: soft, non-distended Ext: no edema Skin: intact Musculoskeletal: Antalgic gait. + slump test bilaterally. Decreased range of motion of left shoulder in all planes. No tenderness to palpation. Antalgic gait.  Psych: pleasant, normal affect     Assessment & Plan:  Marie Burns is a 75 year old woman who presents regarding left humeral fracture following a fall, as well as spinal stenosis.   1) Left humeral fracture:  -She has followed up with surgery and he has recommended conservative treatment. -Has XRs scheduled for the next month.  -She feels that her healing has been slow and asks why this may be. I advised eating foods high in zinc and vitamin X and discussed some options. She states that she has mostly been eating frozen foods- discussed whole food options that she likes.   -XR reviewed and shows humeral neck impaction fracture with hemarthrosis.   -She has been receiving assistance from her daughter for ADLs.   -Encouraged ice 2-3 times per day to reduce swelling. Encouraged hot showers and heating pads as well. Can alternate.   2) Lumbar anterolisthesis: Lumbar MRI 07/2019:  IMPRESSION: 1. Transitional lumbosacral anatomy with partial sacralization of the L5 segment. 2. No significant interval progression of multilevel lumbar spondylosis, most pronounced at the L3-4 level  where there is severe stenosis of the bilateral subarticular recesses and severe canal stenosis. 3. Grade 1 anterolisthesis L3 on L4 and L4 on L5.  -Continue HEP -Continue hot water showers.  -Given neuropathic pain and side effects with nerve agents, will start Nucynta daily and decrease the Percocet to 4 tabs daily. May releast on 1/17.   -s/p Steroid dose pack -Continue gabapentin HS- she cannot tolerate during the day due to drowsiness.  -Routine drug screens have had expected metabolites.  -Continue turmeric daily with black pepper.  -Continue daily cinnamon.  -Discussed ESI as an option for her pain, she will let me know if she wants to do this.  -Encouraged hot  peppers.  -Discussed following foods that may reduce pain: 1) Ginger 2) Blueberries 3) Salmon 4) Pumpkin seeds 5) dark chocolate 6) turmeric 7) tart cherries 8) virgin olive oil 9) chilli peppers 10) mint  Turmeric to reduce inflammation--can be used in cooking or taken as a supplement.  Benefits of turmeric:  -Highly anti-inflammatory  -Increases antioxidants  -Improves memory, attention, brain disease  -Lowers risk of heart disease  -May help prevent cancer  -Decreases pain  -Alleviates depression  -Delays aging and decreases risk of chronic disease  -Consume with black pepper to increase absorption    Turmeric Milk Recipe:  1 cup milk  1 tsp turmeric  1 tsp cinnamon  1 tsp grated ginger (optional)  Black pepper (boosts the anti-inflammatory properties of turmeric).  1 tsp honey

## 2020-11-18 NOTE — Patient Instructions (Signed)
Nucynta 

## 2020-11-18 NOTE — Addendum Note (Signed)
Addended by: Horton Chin on: 11/18/2020 03:13 PM   Modules accepted: Orders

## 2020-11-27 ENCOUNTER — Other Ambulatory Visit: Payer: Self-pay | Admitting: Physical Medicine and Rehabilitation

## 2020-11-27 MED ORDER — OXYCODONE-ACETAMINOPHEN 10-325 MG PO TABS
1.0000 | ORAL_TABLET | Freq: Every day | ORAL | 0 refills | Status: DC | PRN
Start: 2020-11-27 — End: 2020-12-29

## 2020-12-24 ENCOUNTER — Other Ambulatory Visit: Payer: Self-pay | Admitting: Physical Medicine and Rehabilitation

## 2020-12-24 MED ORDER — GABAPENTIN 100 MG PO CAPS: 200.0000 mg | ORAL_CAPSULE | Freq: Three times a day (TID) | ORAL | 1 refills | Status: DC

## 2020-12-29 ENCOUNTER — Encounter: Payer: Self-pay | Admitting: Physical Medicine and Rehabilitation

## 2020-12-29 ENCOUNTER — Other Ambulatory Visit: Payer: Self-pay

## 2020-12-29 ENCOUNTER — Encounter: Payer: Medicare HMO | Attending: Physical Medicine and Rehabilitation | Admitting: Physical Medicine and Rehabilitation

## 2020-12-29 VITALS — BP 167/83 | HR 64 | Temp 98.2°F | Ht <= 58 in | Wt 126.0 lb

## 2020-12-29 DIAGNOSIS — G894 Chronic pain syndrome: Secondary | ICD-10-CM

## 2020-12-29 DIAGNOSIS — I10 Essential (primary) hypertension: Secondary | ICD-10-CM | POA: Diagnosis not present

## 2020-12-29 DIAGNOSIS — Z79891 Long term (current) use of opiate analgesic: Secondary | ICD-10-CM | POA: Diagnosis not present

## 2020-12-29 DIAGNOSIS — M48062 Spinal stenosis, lumbar region with neurogenic claudication: Secondary | ICD-10-CM | POA: Diagnosis not present

## 2020-12-29 DIAGNOSIS — Z5181 Encounter for therapeutic drug level monitoring: Secondary | ICD-10-CM | POA: Diagnosis not present

## 2020-12-29 MED ORDER — OXYCODONE HCL 15 MG PO TABS: 15.0000 mg | ORAL_TABLET | Freq: Three times a day (TID) | ORAL | 0 refills | Status: DC | PRN

## 2020-12-29 NOTE — Progress Notes (Signed)
Subjective:    Patient ID: Marie Burns, female    DOB: August 22, 1946, 75 y.o.   MRN: [id number]  Marie Burns is a 75 year old woman who presents for follow-up of left humeral fracture,   1) Left humeral fracture: She has an XR scheduled next month. She was told that the way the bone is broken it is going to take a long time to heal. She takes five Percocet per day.   2) Neuropathic pain in her left leg> right: She has been taking Gabapentin at night. It makes her too sleepy during the day to take the Gabapentin. She has had headache with Cymbalta. Discussed Nucynta as an option.   3) Lower extremity swelling: much improved.   4) HTN: BP has been well controlled at home.   5) Insomnia: She has been sleeping no more than 4 tablets per day.   Prior history:  XR shows humeral neck impaction fracture with hemarthrosis. I advised her to go to the ED. She tells me today that she received bilateral arm injections for pain relief in the ED. She cannot recall the name of the medication.   She says she was not seen by an orthopedist and was sent home without any pain medication given that she is under pain contract with me and should have enough of her Percocet left given that she received her last refill on 06/19/20 ([place] reviewed).   She was able to call the orthopedist who did her right shoulder replacement. I advised her to try to get an appointment today as possible. She was able to do so at 10am so I advised her to go to this instead of come in for our appointment, and hence our phone appointment in place today.   Pain Inventory Average Pain 9 Pain Right Now 9 My pain is constant, sharp, burning and aching  In the last 24 hours, has pain interfered with the following? General activity 8 Relation with others 10 Enjoyment of life 10 What TIME of day is your pain at its worst? all Sleep (in general) Poor  Pain is worse with: walking, bending, sitting, standing and some activites Pain  improves with: heat/ice and medication Relief from Meds: 5   No family history on file. Social History   Socioeconomic History  . Marital status: Divorced    Spouse name: Not on file  . Number of children: Not on file  . Years of education: Not on file  . Highest education level: Not on file  Occupational History  . Not on file  Tobacco Use  . Smoking status: Never Smoker  . Smokeless tobacco: Never Used  Vaping Use  . Vaping Use: Never used  Substance and Sexual Activity  . Alcohol use: No  . Drug use: No  . Sexual activity: Not on file  Other Topics Concern  . Not on file  Social History Narrative  . Not on file   Social Determinants of Health   Financial Resource Strain: Not on file  Food Insecurity: Not on file  Transportation Needs: Not on file  Physical Activity: Not on file  Stress: Not on file  Social Connections: Not on file   Past Surgical History:  Procedure Laterality Date  . APPENDECTOMY    . BREAST SURGERY Left    lymphnodes removed in the left arm X2  . REVERSE SHOULDER ARTHROPLASTY Right 05/25/2017  . REVERSE SHOULDER ARTHROPLASTY Right 05/25/2017   Procedure: REVERSE SHOULDER ARTHROPLASTY;  Surgeon: Tania Ade, MD;  Location:  [facility];  Service: Orthopedics;  Laterality: Right;  RIGHT REVERSE TOTAL SHOULDER ARTHROPLASTY   Past Medical History:  Diagnosis Date  . Arthritis    right shoulder  . Complication of anesthesia    hard to wake up  . History of kidney stones   . Hypertension   . Hypothyroidism   . Pneumonia    history  . RSD upper limb    left   . Skin cancer    chest   BP (!) 167/83   Pulse 64   Temp 98.2 F (36.8 C)   Ht 4\' 10"  (1.473 m)   Wt 126 lb (57.2 kg)   SpO2 97%   BMI 26.33 kg/m   Opioid Risk Score:   Fall Risk Score:  `1  Depression screen PHQ 2/9  Depression screen [facility] 2/9 11/18/2020 05/20/2020  Decreased Interest 0 0  Down, Depressed, Hopeless 0 0  PHQ - 2 Score 0 0    Review of Systems   Constitutional: Negative.   HENT: Negative.   Eyes: Negative.   Respiratory: Negative.   Cardiovascular: Positive for leg swelling.  Gastrointestinal: Negative.   Endocrine: Negative.   Genitourinary: Positive for urgency.  Musculoskeletal: Positive for back pain and gait problem.  Skin: Negative.   Allergic/Immunologic: Negative.   Neurological: Positive for numbness.       Tingling  Hematological: Negative.   Psychiatric/Behavioral:       Anxiety, Depression  All other systems reviewed and are negative.      Objective:   Physical Exam Gen: no distress, normal appearing HEENT: oral mucosa pink and moist, NCAT Cardio: Reg rate Chest: normal effort, normal rate of breathing Abd: soft, non-distended Ext: no edema Psych: pleasant, normal affect Ext: no edema Skin: intact Musculoskeletal: Antalgic gait. + slump test bilaterally. Decreased range of motion of left shoulder in all planes. No tenderness to palpation. Antalgic gait.  Psych: pleasant, normal affect     Assessment & Plan:  Marie Burns is a 75 year old woman who presents for follow-up of left humeral fracture following a fall, as well as spinal stenosis.   1) Left humeral fracture -She has followed up with surgery and he has recommended conservative treatment. -Has XRs scheduled for the next month.  -She feels that her healing has been slow and asks why this may be. I advised eating foods high in zinc and vitamin X and discussed some options. She states that she has mostly been eating frozen foods- discussed whole food options that she likes.   -XR reviewed and shows humeral neck impaction fracture with hemarthrosis.   -She has been receiving assistance from her daughter for ADLs.   -Encouraged ice 2-3 times per day to reduce swelling. Encouraged hot showers and heating pads as well. Can alternate.   2) Lumbar anterolisthesis: Lumbar MRI 07/2019:  IMPRESSION: 1. 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 2. No significant interval progression of multilevel lumbar spondylosis, most pronounced at the L3-4 level where there is severe stenosis of the bilateral subarticular recesses and severe canal stenosis. 3. Grade 1 anterolisthesis L3 on L4 and L4 on L5.  -Continue HEP -Continue hot water showers.  -Given neuropathic pain and side effects with nerve agents, will start Nucynta daily and decrease the Percocet to 4 tabs daily. May releast on 1/17.   -s/p Steroid dose pack -Continue gabapentin HS- she cannot tolerate during the day due to drowsiness.  -Routine drug screens have had expected metabolites.  -Continue  turmeric daily with black pepper.  -Continue daily cinnamon.  -Discussed ESI as an option for her pain, she will let me know if she wants to do this.  -Encouraged hot peppers.   -Discussed following foods that may reduce pain: 1) Ginger 2) Blueberries 3) Salmon 4) Pumpkin seeds 5) dark chocolate 6) turmeric 7) tart cherries 8) virgin olive oil 9) chilli peppers 10) mint  Link to further information on diet for chronic pain: [email or web address].randall.[email or web address]   Turmeric to reduce inflammation--can be used in cooking or taken as a supplement.  Benefits of turmeric:  -Highly anti-inflammatory  -Increases antioxidants  -Improves memory, attention, brain disease  -Lowers risk of heart disease  -May help prevent cancer  -Decreases pain  -Alleviates depression  -Delays aging and decreases risk of chronic disease  -Consume with black pepper to increase absorption    Turmeric Milk Recipe:  1 cup milk  1 tsp turmeric  1 tsp cinnamon  1 tsp grated ginger (optional)  Black pepper (boosts the anti-inflammatory properties of turmeric).  1 tsp honey   Insomnia: -Increase Gabapentin dose at night to 600mg  to help with both pain and sleep.

## 2020-12-29 NOTE — Patient Instructions (Signed)
-  Discussed following foods that may reduce pain: 1) Ginger 2) Blueberries 3) Salmon 4) Pumpkin seeds 5) dark chocolate 6) turmeric 7) tart cherries 8) virgin olive oil 9) chilli peppers 10) mint  Link to further information on diet for chronic pain: http://www.randall.com/   Turmeric to reduce inflammation-can be used in cooking or taken as a supplement.  Benefits of turmeric:  -Highly anti-inflammatory  -Increases antioxidants  -Improves memory, attention, brain disease  -Lowers risk of heart disease  -May help prevent cancer  -Decreases pain  -Alleviates depression  -Delays aging and decreases risk of chronic disease  -Consume with black pepper to increase absorption    Turmeric Milk Recipe:  1 cup milk  1 tsp turmeric  1 tsp cinnamon  1 tsp grated ginger (optional)  Black pepper (boosts the anti-inflammatory properties of turmeric).  1 tsp honey  HTN: -Advised regarding healthy foods that can help lower blood pressure and provided with a list: 1) citrus foods 2) salmon and other fatty fish 3) swiss chard (leafy green) 4) pumpkin seeds 5) Beans and lentils 6) Berries 7) Amaranth (whole grain, can be cooked similarly to rice and oats) 8) Pistachios 9) Carrots 10) Celery 11) Tomatoes 12) Broccoli 13) Greek yogurt 14) Herbs and spices: Celery seed, cilantro, saffron, lemongrass, black cumin, ginseng, cinnamon, cardamom, sweet basil, and ginger 15) Chia and flax seeds 16) Beets 17) spinach -Educated that goal BP is 120/80. -Made goal to incorporate some of the above foods into her diet.

## 2021-01-02 LAB — DRUG TOX MONITOR 1 W/CONF, ORAL FLD

## 2021-01-02 LAB — DRUG TOX ALC METAB W/CON, ORAL FLD: Alcohol Metabolite: NEGATIVE ng/mL (ref <25)

## 2021-01-05 DIAGNOSIS — S299XXA Unspecified injury of thorax, initial encounter: Secondary | ICD-10-CM | POA: Diagnosis not present

## 2021-01-11 ENCOUNTER — Other Ambulatory Visit: Payer: Self-pay | Admitting: Family Medicine

## 2021-01-11 ENCOUNTER — Other Ambulatory Visit: Payer: Self-pay

## 2021-01-11 ENCOUNTER — Ambulatory Visit
Admission: RE | Admit: 2021-01-11 | Discharge: 2021-01-11 | Disposition: A | Discharge status: Home / Self Care | Payer: Medicare HMO | Source: Ambulatory Visit | Attending: Family Medicine | Admitting: Family Medicine

## 2021-01-11 DIAGNOSIS — S2241XA Multiple fractures of ribs, right side, initial encounter for closed fracture: Secondary | ICD-10-CM | POA: Diagnosis not present

## 2021-01-11 DIAGNOSIS — S299XXA Unspecified injury of thorax, initial encounter: Secondary | ICD-10-CM

## 2021-01-11 DIAGNOSIS — J449 Chronic obstructive pulmonary disease, unspecified: Secondary | ICD-10-CM | POA: Diagnosis not present

## 2021-01-11 DIAGNOSIS — I700 Atherosclerosis of aorta: Secondary | ICD-10-CM | POA: Diagnosis not present

## 2021-01-11 DIAGNOSIS — Z96611 Presence of right artificial shoulder joint: Secondary | ICD-10-CM | POA: Diagnosis not present

## 2021-01-15 ENCOUNTER — Telehealth: Payer: Self-pay

## 2021-01-15 DIAGNOSIS — R54 Age-related physical debility: Secondary | ICD-10-CM | POA: Diagnosis not present

## 2021-01-15 DIAGNOSIS — I10 Essential (primary) hypertension: Secondary | ICD-10-CM | POA: Diagnosis not present

## 2021-01-15 DIAGNOSIS — R0781 Pleurodynia: Secondary | ICD-10-CM | POA: Diagnosis not present

## 2021-01-15 DIAGNOSIS — M810 Age-related osteoporosis without current pathological fracture: Secondary | ICD-10-CM | POA: Diagnosis not present

## 2021-01-15 DIAGNOSIS — E039 Hypothyroidism, unspecified: Secondary | ICD-10-CM | POA: Diagnosis not present

## 2021-01-15 DIAGNOSIS — E785 Hyperlipidemia, unspecified: Secondary | ICD-10-CM | POA: Diagnosis not present

## 2021-01-15 DIAGNOSIS — R739 Hyperglycemia, unspecified: Secondary | ICD-10-CM | POA: Diagnosis not present

## 2021-01-15 DIAGNOSIS — Z0000 Encounter for general adult medical examination without abnormal findings: Secondary | ICD-10-CM | POA: Diagnosis not present

## 2021-01-15 NOTE — Telephone Encounter (Signed)
Left voicemail for Dr. Justin Mend

## 2021-01-15 NOTE — Telephone Encounter (Signed)
Dr. Justin Mend would like to discuss Rx  Treatment for the next 4 weeks,  for [facility]. She recently injured 4 ribs and in a lot of pain.    Call back phone number is [phone].  (Dr. Ranell Patrick is not in the office this afternoon).

## 2021-01-29 DIAGNOSIS — I10 Essential (primary) hypertension: Secondary | ICD-10-CM | POA: Diagnosis not present

## 2021-01-29 DIAGNOSIS — E785 Hyperlipidemia, unspecified: Secondary | ICD-10-CM | POA: Diagnosis not present

## 2021-01-29 DIAGNOSIS — E039 Hypothyroidism, unspecified: Secondary | ICD-10-CM | POA: Diagnosis not present

## 2021-01-29 DIAGNOSIS — R739 Hyperglycemia, unspecified: Secondary | ICD-10-CM | POA: Diagnosis not present

## 2021-01-29 DIAGNOSIS — E559 Vitamin D deficiency, unspecified: Secondary | ICD-10-CM | POA: Diagnosis not present

## 2021-01-29 DIAGNOSIS — E538 Deficiency of other specified B group vitamins: Secondary | ICD-10-CM | POA: Diagnosis not present

## 2021-02-01 ENCOUNTER — Other Ambulatory Visit: Payer: Self-pay | Admitting: Physical Medicine and Rehabilitation

## 2021-02-01 DIAGNOSIS — R410 Disorientation, unspecified: Secondary | ICD-10-CM

## 2021-02-02 ENCOUNTER — Telehealth: Payer: Self-pay | Admitting: Physical Medicine and Rehabilitation

## 2021-02-02 NOTE — Telephone Encounter (Signed)
I have tried reaching patient, no answer and no answering machine.  I left a message on daughter's # [phone] for her mother to call [phone] and get her CT scheduled with [facility].  Please get it scheduled ASAP auth runs out on 03/04/21.

## 2021-02-04 ENCOUNTER — Encounter: Payer: Medicare HMO | Admitting: Physical Medicine and Rehabilitation

## 2021-02-08 ENCOUNTER — Encounter (HOSPITAL_COMMUNITY): Payer: Self-pay

## 2021-02-08 ENCOUNTER — Ambulatory Visit (HOSPITAL_COMMUNITY)
Admission: RE | Admit: 2021-02-08 | Discharge: 2021-02-08 | Disposition: A | Discharge status: Home / Self Care | Payer: Medicare HMO | Source: Ambulatory Visit | Attending: Physical Medicine and Rehabilitation | Admitting: Physical Medicine and Rehabilitation

## 2021-02-08 ENCOUNTER — Other Ambulatory Visit: Payer: Self-pay

## 2021-02-08 DIAGNOSIS — R410 Disorientation, unspecified: Secondary | ICD-10-CM | POA: Insufficient documentation

## 2021-02-09 ENCOUNTER — Ambulatory Visit (HOSPITAL_COMMUNITY): Payer: Medicare HMO

## 2021-02-24 ENCOUNTER — Other Ambulatory Visit: Payer: Self-pay | Admitting: Physical Medicine and Rehabilitation

## 2021-02-24 MED ORDER — TOPIRAMATE 25 MG PO TABS: 25.0000 mg | ORAL_TABLET | Freq: Every day | ORAL | 2 refills | Status: DC

## 2021-04-30 DIAGNOSIS — E538 Deficiency of other specified B group vitamins: Secondary | ICD-10-CM | POA: Diagnosis not present

## 2021-04-30 DIAGNOSIS — E039 Hypothyroidism, unspecified: Secondary | ICD-10-CM | POA: Diagnosis not present

## 2021-04-30 DIAGNOSIS — L819 Disorder of pigmentation, unspecified: Secondary | ICD-10-CM | POA: Diagnosis not present

## 2021-04-30 DIAGNOSIS — E559 Vitamin D deficiency, unspecified: Secondary | ICD-10-CM | POA: Diagnosis not present

## 2021-04-30 DIAGNOSIS — I10 Essential (primary) hypertension: Secondary | ICD-10-CM | POA: Diagnosis not present

## 2021-04-30 DIAGNOSIS — M1990 Unspecified osteoarthritis, unspecified site: Secondary | ICD-10-CM | POA: Diagnosis not present

## 2021-04-30 DIAGNOSIS — R739 Hyperglycemia, unspecified: Secondary | ICD-10-CM | POA: Diagnosis not present

## 2021-06-02 DIAGNOSIS — M25512 Pain in left shoulder: Secondary | ICD-10-CM | POA: Diagnosis not present

## 2021-07-14 DIAGNOSIS — M67912 Unspecified disorder of synovium and tendon, left shoulder: Secondary | ICD-10-CM | POA: Diagnosis not present

## 2021-08-15 IMAGING — CT CT HEAD W/O CM
4 series · 16 of 47 positions shown, 18 images · non-contrast
Comparison: CT head 02/15/2017

CLINICAL DATA: Confusion status post fall

EXAM:
CT HEAD WITHOUT CONTRAST
TECHNIQUE: Contiguous axial images were obtained from the base of the skull
through the vertex without intravenous contrast.

[Series 3: head wo · axial · 0.47mm/px · z∈[-97,+13]mm · 7 of 30 slices shown, 9 images]
[im 4/30  brain]
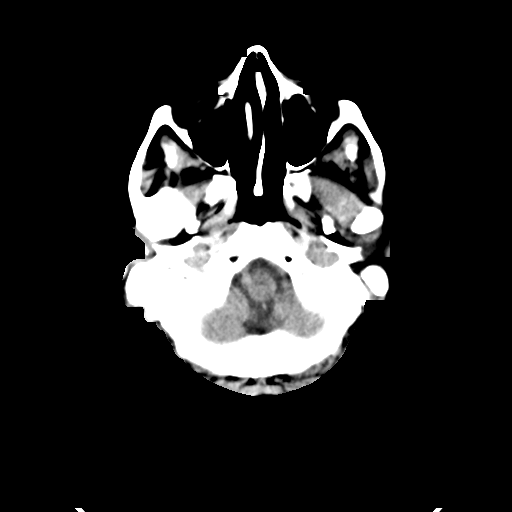
[im 4/30  bone]
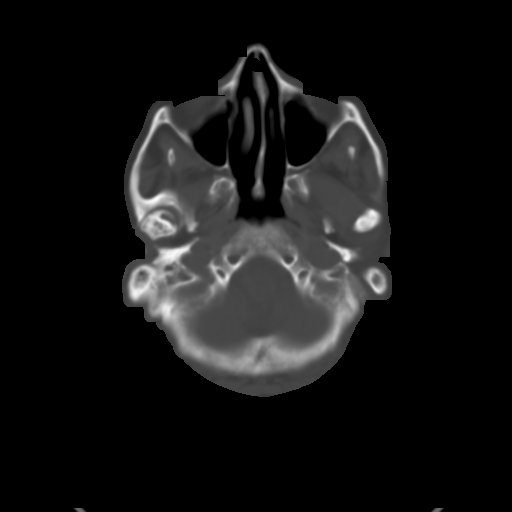
[im 8/30  brain]
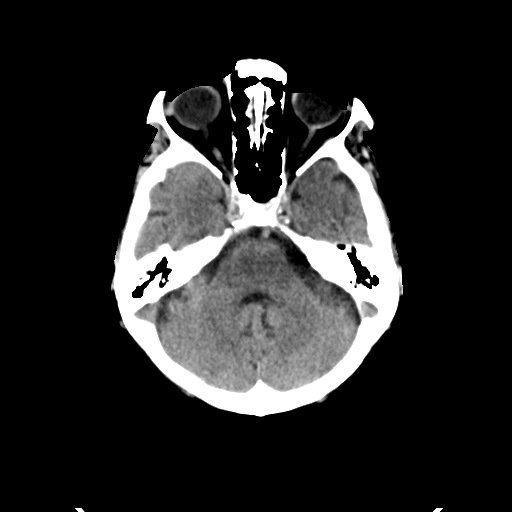
[im 11/30  brain]
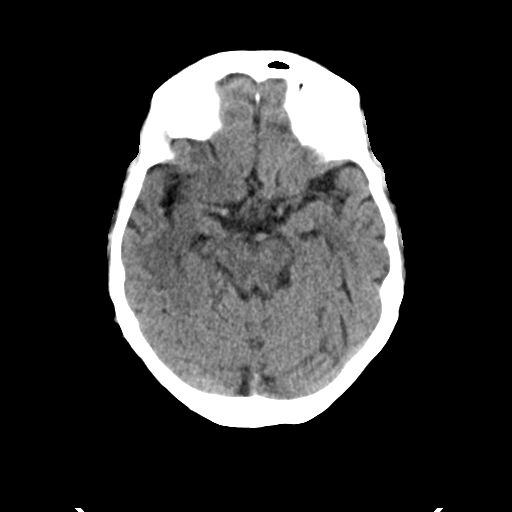
[im 15/30  brain]
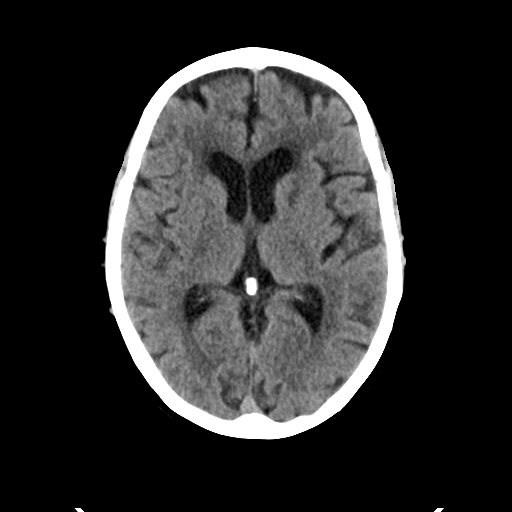
[im 19/30  brain]
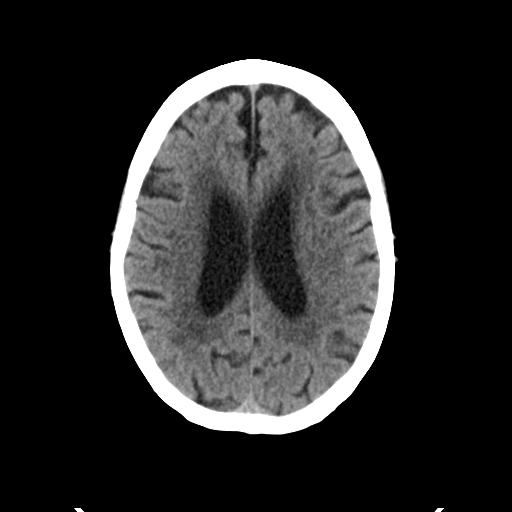
[im 19/30  bone]
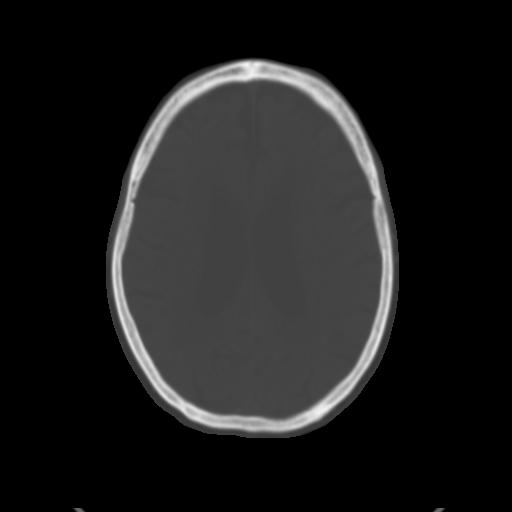
[im 22/30  brain]
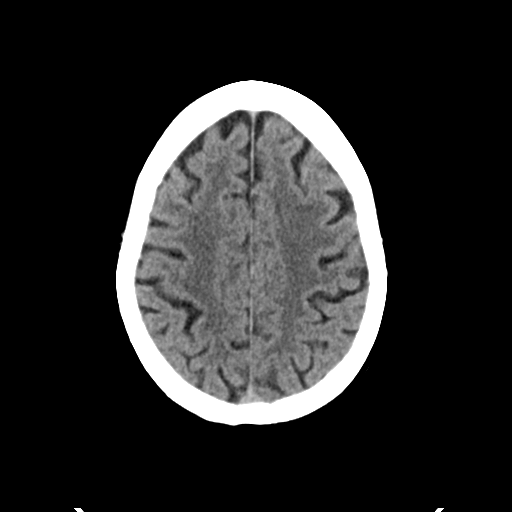
[im 26/30  brain]
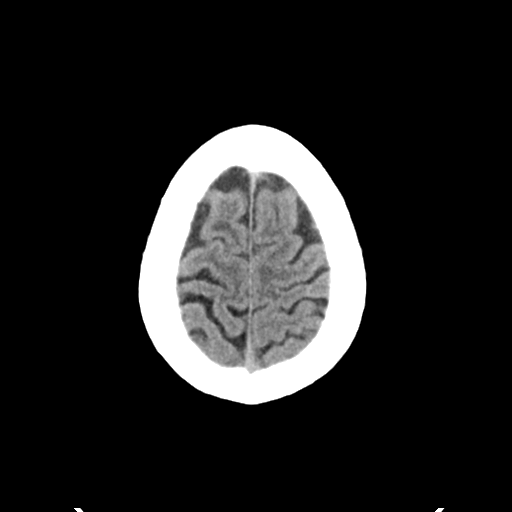

[Series 4: head bone · axial · 0.47mm/px · z∈[-98,-70]mm · 3 of 74 slices shown]
[im 8/74  bone]
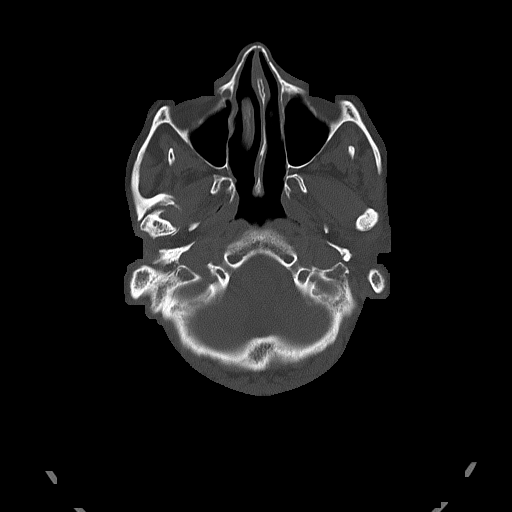
[im 15/74  bone]
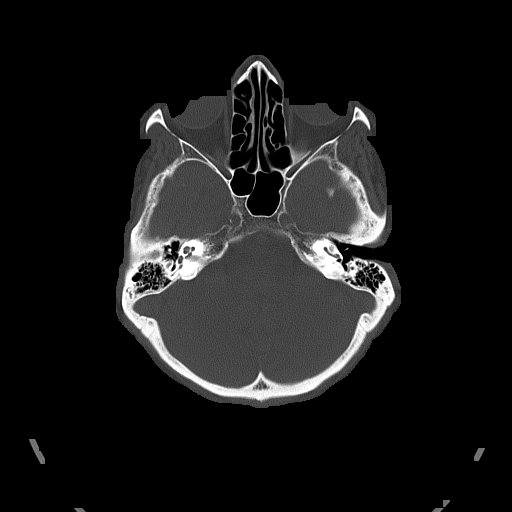
[im 22/74  bone]
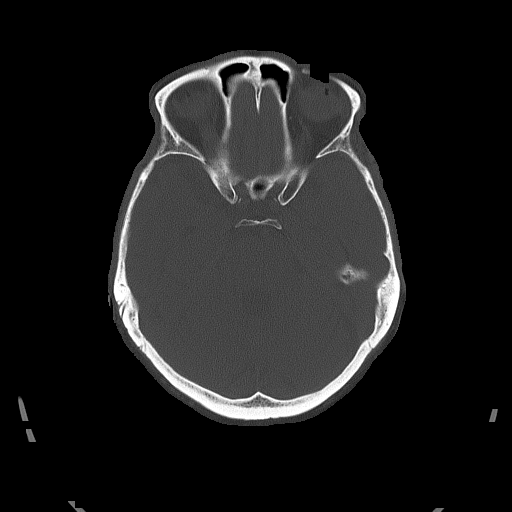

[Series 5: coronal soft tissue · coronal · 0.31mm/px · 3 of 63 slices shown]
[im 21/63  brain]
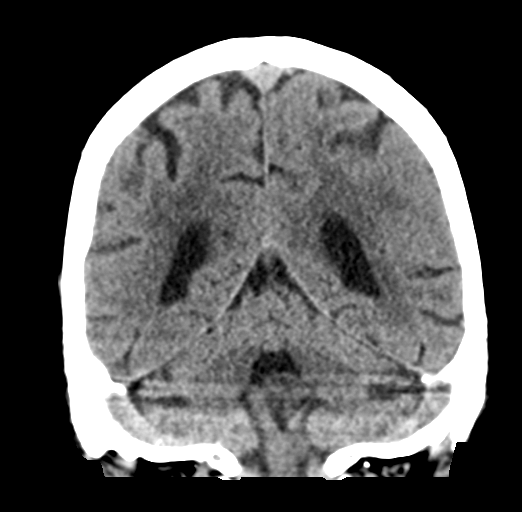
[im 28/63  brain]
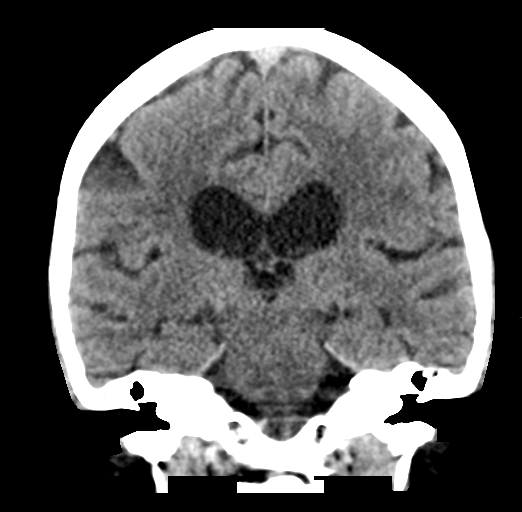
[im 35/63  brain]
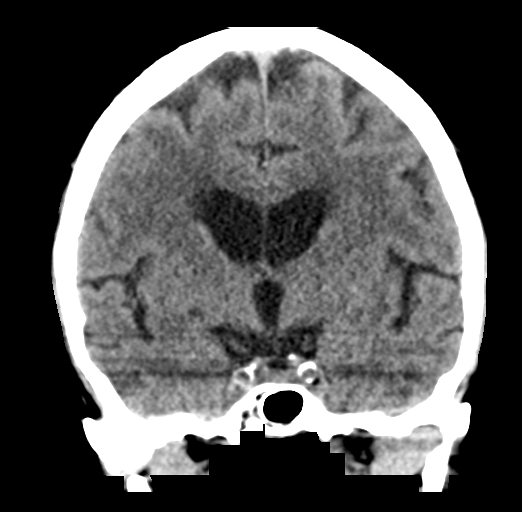

[Series 6: sagittal soft tissue · sagittal · 0.34mm/px · 3 of 50 slices shown]
[im 17/50  brain]
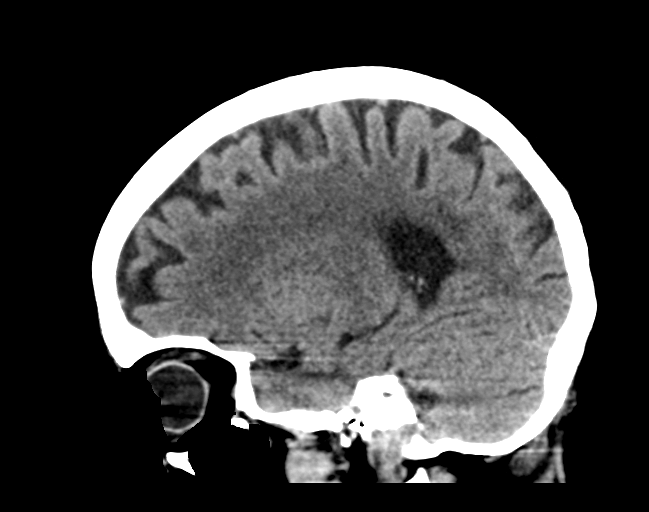
[im 25/50  brain]
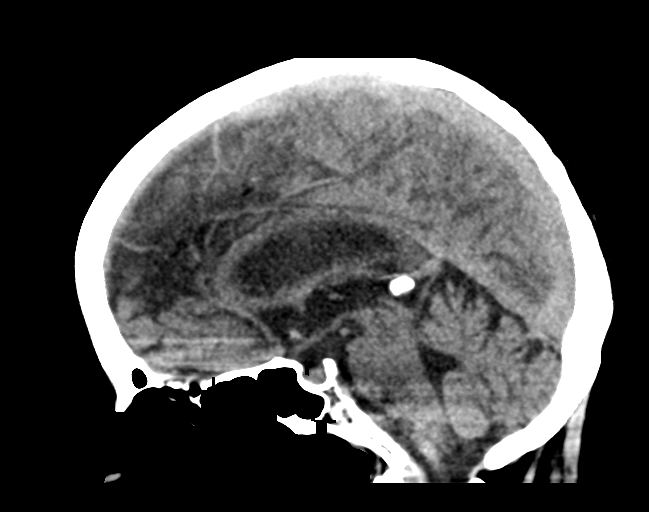
[im 33/50  brain]
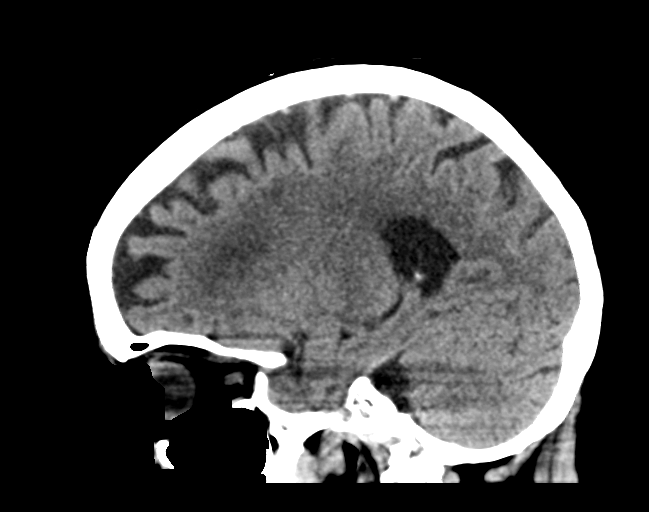

[16 of 47 positions shown; findings below may reference images not displayed]

FINDINGS: Brain:

Stable cerebral ventricle sizes are concordant with the degree of
cerebral volume loss. Patchy and confluent areas of decreased
attenuation are noted throughout the deep and periventricular white
matter of the cerebral hemispheres bilaterally, compatible with
chronic microvascular ischemic disease.

No evidence of large-territorial acute infarction. No parenchymal
hemorrhage. No mass lesion. No extra-axial collection.

No mass effect or midline shift. No hydrocephalus. Basilar cisterns
are patent.

Vascular: No hyperdense vessel. Atherosclerotic calcifications are
present within the cavernous internal carotid arteries.

Skull: No acute fracture or focal lesion.

Sinuses/Orbits: Paranasal sinuses and mastoid air cells are clear.
The orbits are unremarkable.

Other: None.
IMPRESSION: No acute intracranial abnormality.

## 2021-08-30 ENCOUNTER — Encounter: Payer: Medicare HMO | Admitting: Physical Medicine and Rehabilitation

## 2021-08-30 ENCOUNTER — Other Ambulatory Visit: Payer: Self-pay

## 2021-10-12 ENCOUNTER — Other Ambulatory Visit: Payer: Self-pay | Admitting: Physical Medicine and Rehabilitation

## 2021-10-12 MED ORDER — CEPHALEXIN 500 MG PO CAPS: 500.0000 mg | ORAL_CAPSULE | Freq: Two times a day (BID) | ORAL | 0 refills | Status: AC

## 2021-10-26 ENCOUNTER — Encounter: Payer: Medicare HMO | Admitting: Physical Medicine and Rehabilitation

## 2022-02-14 DIAGNOSIS — G8929 Other chronic pain: Secondary | ICD-10-CM | POA: Diagnosis not present

## 2022-02-14 DIAGNOSIS — M48061 Spinal stenosis, lumbar region without neurogenic claudication: Secondary | ICD-10-CM | POA: Diagnosis not present

## 2022-02-14 DIAGNOSIS — E039 Hypothyroidism, unspecified: Secondary | ICD-10-CM | POA: Diagnosis not present

## 2022-02-14 DIAGNOSIS — I10 Essential (primary) hypertension: Secondary | ICD-10-CM | POA: Diagnosis not present

## 2022-03-11 DIAGNOSIS — E559 Vitamin D deficiency, unspecified: Secondary | ICD-10-CM | POA: Diagnosis not present

## 2022-03-11 DIAGNOSIS — R7303 Prediabetes: Secondary | ICD-10-CM | POA: Diagnosis not present

## 2022-03-11 DIAGNOSIS — E785 Hyperlipidemia, unspecified: Secondary | ICD-10-CM | POA: Diagnosis not present

## 2022-03-11 DIAGNOSIS — G8929 Other chronic pain: Secondary | ICD-10-CM | POA: Diagnosis not present

## 2022-03-11 DIAGNOSIS — E039 Hypothyroidism, unspecified: Secondary | ICD-10-CM | POA: Diagnosis not present

## 2022-03-11 DIAGNOSIS — H6191 Disorder of right external ear, unspecified: Secondary | ICD-10-CM | POA: Diagnosis not present

## 2022-03-11 DIAGNOSIS — Z79899 Other long term (current) drug therapy: Secondary | ICD-10-CM | POA: Diagnosis not present

## 2022-03-11 DIAGNOSIS — I10 Essential (primary) hypertension: Secondary | ICD-10-CM | POA: Diagnosis not present

## 2022-03-11 DIAGNOSIS — Z0000 Encounter for general adult medical examination without abnormal findings: Secondary | ICD-10-CM | POA: Diagnosis not present

## 2022-05-03 ENCOUNTER — Encounter: Payer: Medicare HMO | Attending: Physical Medicine and Rehabilitation | Admitting: Physical Medicine and Rehabilitation

## 2022-05-03 ENCOUNTER — Encounter: Payer: Self-pay | Admitting: Physical Medicine and Rehabilitation

## 2022-05-03 ENCOUNTER — Telehealth: Payer: Self-pay

## 2022-05-03 VITALS — BP 138/75 | HR 79 | Ht <= 58 in | Wt 125.0 lb

## 2022-05-03 DIAGNOSIS — M5442 Lumbago with sciatica, left side: Secondary | ICD-10-CM | POA: Insufficient documentation

## 2022-05-03 DIAGNOSIS — G4701 Insomnia due to medical condition: Secondary | ICD-10-CM | POA: Insufficient documentation

## 2022-05-03 DIAGNOSIS — M5441 Lumbago with sciatica, right side: Secondary | ICD-10-CM | POA: Diagnosis not present

## 2022-05-03 DIAGNOSIS — G8929 Other chronic pain: Secondary | ICD-10-CM | POA: Insufficient documentation

## 2022-05-03 MED ORDER — AMITRIPTYLINE HCL 10 MG PO TABS: 10.0000 mg | ORAL_TABLET | Freq: Every day | ORAL | 3 refills | Status: DC

## 2022-05-03 NOTE — Progress Notes (Addendum)
Subjective:    Patient ID: Marie Burns, female    DOB: [birth year], 76 y.o.   MRN: [id number]  Marie Burns is a 76 year old woman who presents for follow-up of left humeral fracture,   1) Left humeral fracture: She has an XR scheduled next month. She was told that the way the bone is broken it is going to take a long time to heal. She takes five Percocet per day.   2) Neuropathic pain in her left leg> right: She has had headache with Cymbalta. Discussed Nucynta as an option.  -worse with standing, walking, sitting. Heating pads do not help, hot shower does not help. Gabapentin caused foul smelling urine.  -not sleeping well at night due to the constant pain -pain radiates down the posterior aspect of both legs -Elavil has helped the pain in the past.  -she wants to retry the oxycodone since that helped her in the past.  Tramadol is not effective at all.  -uses turmeric in her coffee every day.   3) Lower extremity swelling: much improved.   4) HTN: BP has been well controlled at home.   5) Insomnia: She has been sleeping no more than 4 tablets per day. She takes Tramadol at night to help her sleep.   Prior history:  XR shows humeral neck impaction fracture with hemarthrosis. I advised her to go to the ED. She tells me today that she received bilateral arm injections for pain relief in the ED. She cannot recall the name of the medication.   She says she was not seen by an orthopedist and was sent home without any pain medication given that she is under pain contract with me and should have enough of her Percocet left given that she received her last refill on 06/19/20 ([place] reviewed).   She was able to call the orthopedist who did her right shoulder replacement. I advised her to try to get an appointment today as possible. She was able to do so at 10am so I advised her to go to this instead of come in for our appointment, and hence our phone appointment in place today.   Pain  Inventory Average Pain 9 Pain Right Now 9 My pain is constant, sharp, burning and aching  In the last 24 hours, has pain interfered with the following? General activity 9 Relation with others 1 Enjoyment of life 9 What TIME of day is your pain at its worst?  all Sleep (in general) Poor  Pain is worse with: walking, bending, sitting, standing and some activites Pain improves with: medication Relief from Meds: 8   No family history on file. Social History   Socioeconomic History   Marital status: Divorced    Spouse name: Not on file   Number of children: Not on file   Years of education: Not on file   Highest education level: Not on file  Occupational History   Not on file  Tobacco Use   Smoking status: Never   Smokeless tobacco: Never  Vaping Use   Vaping Use: Never used  Substance and Sexual Activity   Alcohol use: No   Drug use: No   Sexual activity: Not on file  Other Topics Concern   Not on file  Social History Narrative   Not on file   Social Determinants of Health   Financial Resource Strain: Not on file  Food Insecurity: Not on file  Transportation Needs: Not on file  Physical Activity: Not on file  Stress:  Not on file  Social Connections: Not on file   Past Surgical History:  Procedure Laterality Date   APPENDECTOMY     BREAST SURGERY Left    lymphnodes removed in the left arm X2   REVERSE SHOULDER ARTHROPLASTY Right 05/25/2017   REVERSE SHOULDER ARTHROPLASTY Right 05/25/2017   Procedure: REVERSE SHOULDER ARTHROPLASTY;  Surgeon: Tania Ade, MD;  Location: [facility];  Service: Orthopedics;  Laterality: Right;  RIGHT REVERSE TOTAL SHOULDER ARTHROPLASTY   Past Medical History:  Diagnosis Date   Arthritis    right shoulder   Complication of anesthesia    hard to wake up   History of kidney stones    Hypertension    Hypothyroidism    Pneumonia    history   RSD upper limb    left    Skin cancer    chest   Ht '4\' 10"'$  (1.473 m)   Wt 125 lb  (56.7 kg)   BMI 26.13 kg/m   Opioid Risk Score:   Fall Risk Score:  `1  Depression screen [facility] 2/9     11/18/2020    2:42 PM 05/20/2020    2:33 PM  Depression screen PHQ 2/9  Decreased Interest 0 0  Down, Depressed, Hopeless 0 0  PHQ - 2 Score 0 0    Review of Systems  Constitutional: Negative.   HENT: Negative.    Eyes: Negative.   Respiratory: Negative.    Cardiovascular:  Positive for leg swelling.  Gastrointestinal: Negative.   Endocrine: Negative.   Genitourinary:  Positive for urgency.  Musculoskeletal:  Positive for back pain and gait problem.  Skin: Negative.   Allergic/Immunologic: Negative.   Neurological:  Positive for numbness.       Tingling  Hematological: Negative.   Psychiatric/Behavioral:         Anxiety, Depression  All other systems reviewed and are negative.      Objective:   Physical Exam Gen: no distress, normal appearing, BMI 26.13 HEENT: oral mucosa pink and moist, NCAT Cardio: Reg rate Chest: normal effort, normal rate of breathing Abd: soft, non-distended Ext: no edema Psych: pleasant, normal affect Ext: no edema Skin: intact Musculoskeletal: Antalgic gait. Positive slump test bilaterally. Decreased range of motion of left shoulder in all planes. No tenderness to palpation. Antalgic gait.  Psych: pleasant, normal affect     Assessment & Plan:  Marie Burns is a 76 year old woman who presents for follow-up of left humeral fracture following a fall, as well as spinal stenosis.   1) Left humeral fracture -She has followed up with surgery and he has recommended conservative treatment. -Has XRs scheduled for the next month.  -She feels that her healing has been slow and asks why this may be. I advised eating foods high in zinc and vitamin X and discussed some options. She states that she has mostly been eating frozen foods- discussed whole food options that she likes.   -XR reviewed and shows humeral neck impaction fracture with  hemarthrosis.   -She has been receiving assistance from her daughter for ADLs.   -Encouraged ice 2-3 times per day to reduce swelling. Encouraged hot showers and heating pads as well. Can alternate.   2) Lumbar anterolisthesis: Lumbar MRI 07/2019:  IMPRESSION: 1. Transitional lumbosacral anatomy with partial sacralization of the L5 segment. 2. No significant interval progression of multilevel lumbar spondylosis, most pronounced at the L3-4 level where there is severe stenosis of the bilateral subarticular recesses and severe canal stenosis. 3. Grade 1  anterolisthesis L3 on L4 and L4 on L5.  -Continue HEP -Continue hot water showers.  -urine screen today -start Elavil '10mg'$  HS -If urine sample contains the expected metabolities, can prescribe Percocet '5mg'$  twice day.  -Provided with a pain relief journal and discussed that it contains foods and lifestyle tips to naturally help to improve pain. Discussed that these lifestyle strategies are also very good for health unlike some medications which can have negative side effects. Discussed that the act of keeping a journal can be therapeutic and helpful to realize patterns what helps to trigger and alleviate pain.   -Given neuropathic pain and side effects with nerve agents, will start Nucynta daily and decrease the Percocet to 4 tabs daily. May releast on 1/17.   -s/p Steroid dose pack -Continue gabapentin HS- she cannot tolerate during the day due to drowsiness.  -Routine drug screens have had expected metabolites.  -Continue turmeric daily with black pepper.  -Continue daily cinnamon.  -Discussed ESI as an option for her pain, she will let me know if she wants to do this.  -Encouraged hot peppers.   -Discussed following foods that may reduce pain: 1) Ginger 2) Blueberries 3) Salmon 4) Pumpkin seeds 5) dark chocolate 6) turmeric 7) tart cherries 8) virgin olive oil 9) chilli peppers 10) mint  Link to further information on diet  for chronic pain: http://www.randall.com/   Turmeric to reduce inflammation--can be used in cooking or taken as a supplement.  Benefits of turmeric:  -Highly anti-inflammatory  -Increases antioxidants  -Improves memory, attention, brain disease  -Lowers risk of heart disease  -May help prevent cancer  -Decreases pain  -Alleviates depression  -Delays aging and decreases risk of chronic disease  -Consume with black pepper to increase absorption    Turmeric Milk Recipe:  1 cup milk  1 tsp turmeric  1 tsp cinnamon  1 tsp grated ginger (optional)  Black pepper (boosts the anti-inflammatory properties of turmeric).  1 tsp honey   Insomnia: -start Amitriptyline '10mg'$  HS

## 2022-05-03 NOTE — Telephone Encounter (Signed)
Mandy from the PCP office called and stated the patient called them stating Dr. Ranell Patrick was not going to prescribe the Tramadol or oxycodone until next month. She stated that they only gave her 14 tablets of the Oxycodone and 240 tablets of the Tramadol. They want to know what you would like to do as far as prescribing the medication

## 2022-05-04 ENCOUNTER — Telehealth: Payer: Self-pay

## 2022-05-04 DIAGNOSIS — Z79891 Long term (current) use of opiate analgesic: Secondary | ICD-10-CM

## 2022-05-04 DIAGNOSIS — Z5181 Encounter for therapeutic drug level monitoring: Secondary | ICD-10-CM

## 2022-05-04 DIAGNOSIS — G894 Chronic pain syndrome: Secondary | ICD-10-CM

## 2022-05-04 NOTE — Telephone Encounter (Signed)
Patient called stating that anti-depressant med prescribed to her yesterday has mad her worse. She states she woke up with extreme anxiety. She states she is completely out of Tramadol. From my understanding you was awaiting a drug screen results but no drug screen was performed. If called in for drug screen patient has 24 hours to get here and patient states it takes 3 days before transportation can come out to get her.

## 2022-05-04 NOTE — Telephone Encounter (Signed)
Attempted to call [facility]. Unable to leave voicemail

## 2022-05-05 DIAGNOSIS — Z5181 Encounter for therapeutic drug level monitoring: Secondary | ICD-10-CM | POA: Diagnosis not present

## 2022-05-05 DIAGNOSIS — G894 Chronic pain syndrome: Secondary | ICD-10-CM | POA: Diagnosis not present

## 2022-05-05 DIAGNOSIS — Z79891 Long term (current) use of opiate analgesic: Secondary | ICD-10-CM | POA: Diagnosis not present

## 2022-05-05 NOTE — Telephone Encounter (Signed)
I notified patient that she would need to come in and get a UDS and it has to be resulted before Dr. Ranell Patrick will be able to prescribe. Patient will contact primary care doctor to see if she can get enough meds until the results come in. I informed patient that I will put an order in and she can go to any [facility].

## 2022-05-09 ENCOUNTER — Telehealth: Payer: Self-pay | Admitting: *Deleted

## 2022-05-10 ENCOUNTER — Telehealth: Payer: Self-pay | Admitting: *Deleted

## 2022-05-10 ENCOUNTER — Other Ambulatory Visit: Payer: Self-pay | Admitting: Physical Medicine and Rehabilitation

## 2022-05-10 LAB — TOXASSURE SELECT,+ANTIDEPR,UR

## 2022-05-10 MED ORDER — OXYCODONE-ACETAMINOPHEN 5-325 MG PO TABS: 1.0000 | ORAL_TABLET | Freq: Two times a day (BID) | ORAL | 0 refills | Status: DC | PRN

## 2022-05-11 NOTE — Telephone Encounter (Signed)
I have let her know medication was sent in. Her PCP sent in one week supply of med after receiving the fax from our office so she will start when that is complete.

## 2022-05-18 ENCOUNTER — Telehealth: Payer: Self-pay | Admitting: *Deleted

## 2022-05-18 NOTE — Telephone Encounter (Signed)
Marie Burns called and reports the medication Dr Ranell Patrick sent in is not working at all and she needs for her to send in something that will help her because she is in so much pain.She called and reported this x2.

## 2022-05-19 ENCOUNTER — Encounter: Payer: Medicare HMO | Attending: Physical Medicine and Rehabilitation | Admitting: Physical Medicine and Rehabilitation

## 2022-05-19 DIAGNOSIS — M545 Low back pain, unspecified: Secondary | ICD-10-CM | POA: Insufficient documentation

## 2022-05-19 DIAGNOSIS — G894 Chronic pain syndrome: Secondary | ICD-10-CM | POA: Diagnosis not present

## 2022-05-19 DIAGNOSIS — G4701 Insomnia due to medical condition: Secondary | ICD-10-CM | POA: Insufficient documentation

## 2022-05-19 DIAGNOSIS — Z79891 Long term (current) use of opiate analgesic: Secondary | ICD-10-CM | POA: Insufficient documentation

## 2022-05-19 DIAGNOSIS — M48062 Spinal stenosis, lumbar region with neurogenic claudication: Secondary | ICD-10-CM | POA: Insufficient documentation

## 2022-05-19 DIAGNOSIS — G8929 Other chronic pain: Secondary | ICD-10-CM | POA: Insufficient documentation

## 2022-05-19 DIAGNOSIS — M546 Pain in thoracic spine: Secondary | ICD-10-CM | POA: Insufficient documentation

## 2022-05-19 DIAGNOSIS — M5416 Radiculopathy, lumbar region: Secondary | ICD-10-CM | POA: Insufficient documentation

## 2022-05-19 DIAGNOSIS — Z5181 Encounter for therapeutic drug level monitoring: Secondary | ICD-10-CM | POA: Insufficient documentation

## 2022-05-19 NOTE — Progress Notes (Signed)
Subjective:    Patient ID: Marie Burns, female    DOB: Feb 05, 1946, 76 y.o.   MRN: [id number]  Marie Burns is a 76 year old woman who presents for f/u of chronic neurogenic claudication  1) Left humeral fracture: She has an XR scheduled next month. She was told that the way the bone is broken it is going to take a long time to heal. She takes five Percocet per day.   2) Neuropathic pain in her left leg> right: She has had headache with Cymbalta. Discussed Nucynta as an option.  -she is currently getting no pain relief from Percocet -she remembers getting more pain relief from oxycodone 15 in the past and would like to switch to oxycodone -she did not find Gabapentin helpful and would not like to try this again.  -pain is worst at night -she finds amitriptyline somewhat helpful but does not want to increase the dose.  -worse with standing, walking, sitting. Heating pads do not help, hot shower does not help. Gabapentin caused foul smelling urine.  -not sleeping well at night due to the constant pain -pain radiates down the posterior aspect of both legs -Elavil has helped the pain in the past.  -she wants to retry the oxycodone since that helped her in the past.  Tramadol is not effective at all.  -uses turmeric in her coffee every day.   3) Lower extremity swelling: much improved.   4) HTN: BP has been well controlled at home.   5) Insomnia: She has been sleeping no more than 4 tablets per day. She takes Tramadol at night to help her sleep.    Prior history:  XR shows humeral neck impaction fracture with hemarthrosis. I advised her to go to the ED. She tells me today that she received bilateral arm injections for pain relief in the ED. She cannot recall the name of the medication.   She says she was not seen by an orthopedist and was sent home without any pain medication given that she is under pain contract with me and should have enough of her Percocet left given that she  received her last refill on 06/19/20 ([place] reviewed).   She was able to call the orthopedist who did her right shoulder replacement. I advised her to try to get an appointment today as possible. She was able to do so at 10am so I advised her to go to this instead of come in for our appointment, and hence our phone appointment in place today.   Pain Inventory Average Pain 9 Pain Right Now 9 My pain is constant, sharp, burning and aching  In the last 24 hours, has pain interfered with the following? General activity 9 Relation with others 1 Enjoyment of life 9 What TIME of day is your pain at its worst?  all Sleep (in general) Poor  Pain is worse with: walking, bending, sitting, standing and some activites Pain improves with: medication Relief from Meds: 8   No family history on file. Social History   Socioeconomic History   Marital status: Divorced    Spouse name: Not on file   Number of children: Not on file   Years of education: Not on file   Highest education level: Not on file  Occupational History   Not on file  Tobacco Use   Smoking status: Never   Smokeless tobacco: Never  Vaping Use   Vaping Use: Never used  Substance and Sexual Activity   Alcohol use: No  Drug use: No   Sexual activity: Not on file  Other Topics Concern   Not on file  Social History Narrative   Not on file   Social Determinants of Health   Financial Resource Strain: Not on file  Food Insecurity: Not on file  Transportation Needs: Not on file  Physical Activity: Not on file  Stress: Not on file  Social Connections: Not on file   Past Surgical History:  Procedure Laterality Date   APPENDECTOMY     BREAST SURGERY Left    lymphnodes removed in the left arm X2   REVERSE SHOULDER ARTHROPLASTY Right 05/25/2017   REVERSE SHOULDER ARTHROPLASTY Right 05/25/2017   Procedure: REVERSE SHOULDER ARTHROPLASTY;  Surgeon: Tania Ade, MD;  Location: [place];  Service: Orthopedics;  Laterality:  Right;  RIGHT REVERSE TOTAL SHOULDER ARTHROPLASTY   Past Medical History:  Diagnosis Date   Arthritis    right shoulder   Complication of anesthesia    hard to wake up   History of kidney stones    Hypertension    Hypothyroidism    Pneumonia    history   RSD upper limb    left    Skin cancer    chest   There were no vitals taken for this visit.  Opioid Risk Score:   Fall Risk Score:  `1  Depression screen [facility] 2/9     05/03/2022    9:25 AM 11/18/2020    2:42 PM 05/20/2020    2:33 PM  Depression screen [facility] 2/9  Decreased Interest 0 0 0  Down, Depressed, Hopeless 0 0 0  PHQ - 2 Score 0 0 0    Review of Systems  Constitutional: Negative.   HENT: Negative.    Eyes: Negative.   Respiratory: Negative.    Cardiovascular:  Positive for leg swelling.  Gastrointestinal: Negative.   Endocrine: Negative.   Genitourinary:  Positive for urgency.  Musculoskeletal:  Positive for back pain and gait problem.  Skin: Negative.   Allergic/Immunologic: Negative.   Neurological:  Positive for numbness.       Tingling  Hematological: Negative.   Psychiatric/Behavioral:         Anxiety, Depression  All other systems reviewed and are negative.      Objective:   Physical Exam Not performed as patient was seen via phone    Assessment & Plan:  Marie Burns is a 76 year old woman who presents for follow-up of left humeral fracture following a fall, as well as spinal stenosis.   1) History of left humeral fracture: healed  2) Lumbar anterolisthesis: Lumbar MRI 07/2019:  IMPRESSION: 1. Transitional lumbosacral anatomy with partial sacralization of the L5 segment. 2. No significant interval progression of multilevel lumbar spondylosis, most pronounced at the L3-4 level where there is severe stenosis of the bilateral subarticular recesses and severe canal stenosis. 3. Grade 1 anterolisthesis L3 on L4 and L4 on L5.  -Continue HEP -Continue hot water showers.  -urine screen  today -start Elavil '10mg'$  HS -If urine sample contains the expected metabolities, can prescribe Percocet '5mg'$  twice day.  -Provided with a pain relief journal and discussed that it contains foods and lifestyle tips to naturally help to improve pain. Discussed that these lifestyle strategies are also very good for health unlike some medications which can have negative side effects. Discussed that the act of keeping a journal can be therapeutic and helpful to realize patterns what helps to trigger and alleviate pain.   -Given neuropathic pain and  side effects with nerve agents, will start Nucynta daily and decrease the Percocet to 4 tabs daily. May releast on 1/17.   -s/p Steroid dose pack -d/c gabapentin given lack of efficacy -will not retry cymbalta since this gave her headache -switch to '10mg'$  oxycodone bid when next due for refill -Routine drug screens have had expected metabolites.  -Continue turmeric daily with black pepper.  -Continue daily cinnamon.  -Discussed ESI as an option for her pain, she will let me know if she wants to do this.  -Encouraged hot peppers.   -Discussed following foods that may reduce pain: 1) Ginger 2) Blueberries 3) Salmon 4) Pumpkin seeds 5) dark chocolate 6) turmeric 7) tart cherries 8) virgin olive oil 9) chilli peppers 10) mint  Link to further information on diet for chronic pain: http://www.randall.com/   Turmeric to reduce inflammation--can be used in cooking or taken as a supplement.  Benefits of turmeric:  -Highly anti-inflammatory  -Increases antioxidants  -Improves memory, attention, brain disease  -Lowers risk of heart disease  -May help prevent cancer  -Decreases pain  -Alleviates depression  -Delays aging and decreases risk of chronic disease  -Consume with black pepper to increase absorption    Turmeric Milk Recipe:  1 cup milk  1 tsp turmeric  1  tsp cinnamon  1 tsp grated ginger (optional)  Black pepper (boosts the anti-inflammatory properties of turmeric).  1 tsp honey   Insomnia: -continue Amitriptyline '10mg'$  HS  11 minutes spent in discussion of her pain, lack of benefit from percocet '5mg'$  bid, prior benefit from oxycodone '15mg'$ , some benefit for sleep from amitriptyline but that she does not want to increase dose

## 2022-05-31 ENCOUNTER — Encounter (HOSPITAL_BASED_OUTPATIENT_CLINIC_OR_DEPARTMENT_OTHER): Payer: Medicare HMO | Admitting: Registered Nurse

## 2022-05-31 VITALS — BP 155/74 | HR 67 | Ht <= 58 in | Wt 127.8 lb

## 2022-05-31 DIAGNOSIS — M5416 Radiculopathy, lumbar region: Secondary | ICD-10-CM | POA: Diagnosis not present

## 2022-05-31 DIAGNOSIS — M546 Pain in thoracic spine: Secondary | ICD-10-CM

## 2022-05-31 DIAGNOSIS — G4701 Insomnia due to medical condition: Secondary | ICD-10-CM | POA: Diagnosis not present

## 2022-05-31 DIAGNOSIS — M545 Low back pain, unspecified: Secondary | ICD-10-CM | POA: Diagnosis not present

## 2022-05-31 DIAGNOSIS — M48062 Spinal stenosis, lumbar region with neurogenic claudication: Secondary | ICD-10-CM | POA: Diagnosis not present

## 2022-05-31 DIAGNOSIS — G8929 Other chronic pain: Secondary | ICD-10-CM | POA: Diagnosis not present

## 2022-05-31 DIAGNOSIS — Z79891 Long term (current) use of opiate analgesic: Secondary | ICD-10-CM

## 2022-05-31 DIAGNOSIS — G894 Chronic pain syndrome: Secondary | ICD-10-CM | POA: Diagnosis not present

## 2022-05-31 DIAGNOSIS — Z5181 Encounter for therapeutic drug level monitoring: Secondary | ICD-10-CM | POA: Diagnosis not present

## 2022-05-31 MED ORDER — OXYCODONE HCL 10 MG PO TABS: 10.0000 mg | ORAL_TABLET | Freq: Two times a day (BID) | ORAL | 0 refills | Status: DC | PRN

## 2022-05-31 NOTE — Patient Instructions (Signed)
Please call our office next week with update on medication change   [phone]

## 2022-05-31 NOTE — Progress Notes (Signed)
Subjective:    Patient ID: Marie Burns, female    DOB: 1946-06-26, 76 y.o.   MRN: [id number]  HPI: Marie Burns is a 76 y.o. female who returns for follow up appointment for chronic pain and medication refill. She states her pain is located in her mid- lower back radiating into her bilateral lower extremities, she also reports her pain has increased in intensity, she denies falling. Marie Burns also states she is not receiving any relief with her current medication regimen. Dr Ranell Patrick note was reviewed, PMP was reviewed. Marie Burns very tearful, emotional support given. This provider was in contact with Dr Ranell Patrick, Dr Ranell Patrick agrees with MRI. Marie Burns understanding.   She rates her pain 10. Her current exercise regime is walking' \\short'$  distances.    Pain Inventory Average Pain 8 Pain Right Now 10 My pain is sharp  In the last 24 hours, has pain interfered with the following? General activity 10 Relation with others 10 Enjoyment of life 10 What TIME of day is your pain at its worst? varies Sleep (in general) Fair  Pain is worse with: walking, bending, sitting, standing, and some activites Pain improves with: medication Relief from Meds: 0  No family history on file. Social History   Socioeconomic History   Marital status: Divorced    Spouse name: Not on file   Number of children: Not on file   Years of education: Not on file   Highest education level: Not on file  Occupational History   Not on file  Tobacco Use   Smoking status: Never   Smokeless tobacco: Never  Vaping Use   Vaping Use: Never used  Substance and Sexual Activity   Alcohol use: No   Drug use: No   Sexual activity: Not on file  Other Topics Concern   Not on file  Social History Narrative   Not on file   Social Determinants of Health   Financial Resource Strain: Not on file  Food Insecurity: Not on file  Transportation Needs: Not on file  Physical Activity: Not on file   Stress: Not on file  Social Connections: Not on file   Past Surgical History:  Procedure Laterality Date   APPENDECTOMY     BREAST SURGERY Left    lymphnodes removed in the left arm X2   REVERSE SHOULDER ARTHROPLASTY Right 05/25/2017   REVERSE SHOULDER ARTHROPLASTY Right 05/25/2017   Procedure: REVERSE SHOULDER ARTHROPLASTY;  Surgeon: Tania Ade, MD;  Location: [place];  Service: Orthopedics;  Laterality: Right;  RIGHT REVERSE TOTAL SHOULDER ARTHROPLASTY   Past Surgical History:  Procedure Laterality Date   APPENDECTOMY     BREAST SURGERY Left    lymphnodes removed in the left arm X2   REVERSE SHOULDER ARTHROPLASTY Right 05/25/2017   REVERSE SHOULDER ARTHROPLASTY Right 05/25/2017   Procedure: REVERSE SHOULDER ARTHROPLASTY;  Surgeon: Tania Ade, MD;  Location: [place];  Service: Orthopedics;  Laterality: Right;  RIGHT REVERSE TOTAL SHOULDER ARTHROPLASTY   Past Medical History:  Diagnosis Date   Arthritis    right shoulder   Complication of anesthesia    hard to wake up   History of kidney stones    Hypertension    Hypothyroidism    Pneumonia    history   RSD upper limb    left    Skin cancer    chest   BP (!) 155/74   Pulse 67   Ht '4\' 10"'$  (1.473 m)   Wt 127 lb 12.8  oz (58 kg)   SpO2 97%   BMI 26.71 kg/m   Opioid Risk Score:   Fall Risk Score:  `1  Depression screen [facility] 2/9     05/03/2022    9:25 AM 11/18/2020    2:42 PM 05/20/2020    2:33 PM  Depression screen PHQ 2/9  Decreased Interest 0 0 0  Down, Depressed, Hopeless 0 0 0  PHQ - 2 Score 0 0 0      Review of Systems  Musculoskeletal:  Positive for back pain.       Bilateral leg pain  All other systems reviewed and are negative.     Objective:   Physical Exam Vitals and nursing note reviewed.  Constitutional:      Appearance: Normal appearance.  Cardiovascular:     Rate and Rhythm: Normal rate and regular rhythm.     Pulses: Normal pulses.     Heart sounds: Normal heart sounds.   Pulmonary:     Effort: Pulmonary effort is normal.     Breath sounds: Normal breath sounds.  Musculoskeletal:     Cervical back: Normal range of motion and neck supple.     Comments: Normal Muscle Bulk and Muscle Testing Reveals:  Upper Extremities: Decreased ROM 45 Degrees and Muscle Strength 5/5  Thoracic and Lumbar Hypersensitivity Bilateral Greater Trochanter Tenderness Lower Extremities: Decreased ROM and Muscle Strength 5/5 Bilateral Lower Extremities Flexion Produces Pain into her Lumbar Arises from chair slowly Antalgic  Gait     Skin:    General: Skin is warm and dry.  Neurological:     Mental Status: She is alert and oriented to person, place, and time.  Psychiatric:        Mood and Affect: Mood normal.        Behavior: Behavior normal.         Assessment & Plan:  Acute Exacerbation of Chronic Low Back Pain: RX: MRI. Continue to Monitor. Awaiting Results Chronic Bilateral Thoracic Back Pain: Continue HEP as Tolerated. Continue current medication regimen. Continue to Monitor.  Lumbar Spinal Stenosis / Lumbar Radiculitis: Continue HEP as Tolerated. Continue amitriptyline . Continue to Monitor.   Chronic Pain Syndrome: RX: Oxycodone 10 mg one tablet twice a day as needed or pain #60. Encouraged to keep a pain Journal, she verbalizes understanding.  We will continue the opioid monitoring program, this consists of regular clinic visits, examinations, urine drug screen, pill counts as well as use of [place] Controlled Substance Reporting system. A 12 month History has been reviewed on the [place] Controlled Substance Reporting System on 05/31/2022  F/U in Month

## 2022-06-02 ENCOUNTER — Encounter: Payer: Self-pay | Admitting: Registered Nurse

## 2022-06-06 ENCOUNTER — Telehealth: Payer: Self-pay | Admitting: *Deleted

## 2022-06-06 NOTE — Telephone Encounter (Signed)
Mrs Harwick says she needs to speak with Zella Ball.

## 2022-06-08 NOTE — Telephone Encounter (Signed)
Return Ms. Domingo call, no answer. Left message to return the call.

## 2022-06-10 ENCOUNTER — Encounter (HOSPITAL_BASED_OUTPATIENT_CLINIC_OR_DEPARTMENT_OTHER): Payer: Medicare HMO | Admitting: Physical Medicine and Rehabilitation

## 2022-06-10 DIAGNOSIS — G894 Chronic pain syndrome: Secondary | ICD-10-CM

## 2022-06-11 NOTE — Progress Notes (Signed)
Subjective:    Patient ID: Marie Burns, female    DOB: [birth year], 76 y.o.   MRN: [id number]  Marie Burns is a 76 year old woman who presents for f/u of chronic neurogenic claudication  1) Left humeral fracture: She has an XR scheduled next month. She was told that the way the bone is broken it is going to take a long time to heal. She takes five Percocet per day.   2) Neuropathic pain in her left leg> right: She has had headache with Cymbalta. Discussed Nucynta as an option.  -she is currently getting no pain relief from Percocet -she remembers getting more pain relief from oxycodone 15 in the past  -she did not find Gabapentin helpful and would not like to try this again.  -pain is worst at night -she finds amitriptyline somewhat helpful but does not want to increase the dose.  -worse with standing, walking, sitting. Heating pads do not help, hot shower does not help. Gabapentin caused foul smelling urine.  -not sleeping well at night due to the constant pain -pain radiates down the posterior aspect of both legs -Elavil has helped the pain in the past.  -she wants to retry the oxycodone since that helped her in the past.  Tramadol is not effective at all.  -uses turmeric in her coffee every day.  -patient states she is not getting any benefit from oxycodone currently but this has been the only thing that has helped her in the past so she is not sure why it is not working for her anymore -she does not want to increase the Elavil further -she does not want to try Savella -she is currently taking 2 oxycodone '10mg'$  HCL daily and not getting any relief -she is severely limited in her function by pain  3) Lower extremity swelling: much improved.   4) HTN: BP has been well controlled at home.   5) Insomnia: She has been sleeping no more than 4 tablets per day. She takes Tramadol at night to help her sleep.    Prior history:  XR shows humeral neck impaction fracture with  hemarthrosis. I advised her to go to the ED. She tells me today that she received bilateral arm injections for pain relief in the ED. She cannot recall the name of the medication.   She says she was not seen by an orthopedist and was sent home without any pain medication given that she is under pain contract with me and should have enough of her Percocet left given that she received her last refill on 06/19/20 ([place] reviewed).   She was able to call the orthopedist who did her right shoulder replacement. I advised her to try to get an appointment today as possible. She was able to do so at 10am so I advised her to go to this instead of come in for our appointment, and hence our phone appointment in place today.   Pain Inventory Average Pain 9 Pain Right Now 9 My pain is constant, sharp, burning and aching  In the last 24 hours, has pain interfered with the following? General activity 9 Relation with others 1 Enjoyment of life 9 What TIME of day is your pain at its worst?  all Sleep (in general) Poor  Pain is worse with: walking, bending, sitting, standing and some activites Pain improves with: medication Relief from Meds: 8   No family history on file. Social History   Socioeconomic History   Marital status: Divorced  Spouse name: Not on file   Number of children: Not on file   Years of education: Not on file   Highest education level: Not on file  Occupational History   Not on file  Tobacco Use   Smoking status: Never   Smokeless tobacco: Never  Vaping Use   Vaping Use: Never used  Substance and Sexual Activity   Alcohol use: No   Drug use: No   Sexual activity: Not on file  Other Topics Concern   Not on file  Social History Narrative   Not on file   Social Determinants of Health   Financial Resource Strain: Not on file  Food Insecurity: Not on file  Transportation Needs: Not on file  Physical Activity: Not on file  Stress: Not on file  Social Connections: Not  on file   Past Surgical History:  Procedure Laterality Date   APPENDECTOMY     BREAST SURGERY Left    lymphnodes removed in the left arm X2   REVERSE SHOULDER ARTHROPLASTY Right 05/25/2017   REVERSE SHOULDER ARTHROPLASTY Right 05/25/2017   Procedure: REVERSE SHOULDER ARTHROPLASTY;  Surgeon: Tania Ade, MD;  Location: [place];  Service: Orthopedics;  Laterality: Right;  RIGHT REVERSE TOTAL SHOULDER ARTHROPLASTY   Past Medical History:  Diagnosis Date   Arthritis    right shoulder   Complication of anesthesia    hard to wake up   History of kidney stones    Hypertension    Hypothyroidism    Pneumonia    history   RSD upper limb    left    Skin cancer    chest   There were no vitals taken for this visit.  Opioid Risk Score:   Fall Risk Score:  `1  Depression screen [facility] 2/9     05/03/2022    9:25 AM 11/18/2020    2:42 PM 05/20/2020    2:33 PM  Depression screen [facility] 2/9  Decreased Interest 0 0 0  Down, Depressed, Hopeless 0 0 0  PHQ - 2 Score 0 0 0    Review of Systems  Constitutional: Negative.   HENT: Negative.    Eyes: Negative.   Respiratory: Negative.    Cardiovascular:  Positive for leg swelling.  Gastrointestinal: Negative.   Endocrine: Negative.   Genitourinary:  Positive for urgency.  Musculoskeletal:  Positive for back pain and gait problem.  Skin: Negative.   Allergic/Immunologic: Negative.   Neurological:  Positive for numbness.       Tingling  Hematological: Negative.   Psychiatric/Behavioral:         Anxiety, Depression  All other systems reviewed and are negative.      Objective:   Physical Exam Not performed as patient was seen via phone    Assessment & Plan:  Marie Burns is a 76 year old woman who presents for follow-up of left humeral fracture following a fall, as well as spinal stenosis.   1) History of left humeral fracture: healed  2) Lumbar anterolisthesis: Lumbar MRI 07/2019:  IMPRESSION: 1. Transitional lumbosacral anatomy  with partial sacralization of the L5 segment. 2. No significant interval progression of multilevel lumbar spondylosis, most pronounced at the L3-4 level where there is severe stenosis of the bilateral subarticular recesses and severe canal stenosis. 3. Grade 1 anterolisthesis L3 on L4 and L4 on L5.  -Continue HEP -Continue hot water showers.  -urine screen today -continue Elavil '10mg'$  HS -If urine sample contains the expected metabolities, can prescribe Percocet '5mg'$  twice day.  -  Provided with a pain relief journal and discussed that it contains foods and lifestyle tips to naturally help to improve pain. Discussed that these lifestyle strategies are also very good for health unlike some medications which can have negative side effects. Discussed that the act of keeping a journal can be therapeutic and helpful to realize patterns what helps to trigger and alleviate pain.   -Given neuropathic pain and side effects with nerve agents, will start Nucynta daily and decrease the Percocet to 4 tabs daily. May releast on 1/17.   -s/p Steroid dose pack -d/c gabapentin given lack of efficacy -will not retry cymbalta since this gave her headache -switch to '15mg'$  oxycodone oblong tablets BID when due for next refill (I asked patient to contact her pharmacy to ask what the difference is between her current medication which is not helping her and the medication she received that helped her in the past and she was told that the only difference is that the tablets she received before were in oblong form, but they had the same ingredients) -discussed increasing Elavil or starting [place] but she prefers not to try -Routine drug screens have had expected metabolites.  -Continue turmeric daily with black pepper.  -Continue daily cinnamon.  -Discussed ESI as an option for her pain, she will let me know if she wants to do this.  -Encouraged hot peppers.   -Discussed following foods that may reduce pain: 1)  Ginger 2) Blueberries 3) Salmon 4) Pumpkin seeds 5) dark chocolate 6) turmeric 7) tart cherries 8) virgin olive oil 9) chilli peppers 10) mint  Link to further information on diet for chronic pain: http://www.randall.com/   Turmeric to reduce inflammation--can be used in cooking or taken as a supplement.  Benefits of turmeric:  -Highly anti-inflammatory  -Increases antioxidants  -Improves memory, attention, brain disease  -Lowers risk of heart disease  -May help prevent cancer  -Decreases pain  -Alleviates depression  -Delays aging and decreases risk of chronic disease  -Consume with black pepper to increase absorption    Turmeric Milk Recipe:  1 cup milk  1 tsp turmeric  1 tsp cinnamon  1 tsp grated ginger (optional)  Black pepper (boosts the anti-inflammatory properties of turmeric).  1 tsp honey   Insomnia: -continue Amitriptyline '10mg'$  HS  23 minutes spent in discussion of current pain, location, the ways in which it limites her, her current response to oxycodone and Elavil, options of increasing Elavil, adding Savella, or increasing oxycodone dose or frequency, discussing when her follow-up with Zella Ball is and when she is due for her next refill, reminding her that we can not send new medication scripts untol refills are due, discussion of other medications which she has failed before

## 2022-06-15 ENCOUNTER — Other Ambulatory Visit: Payer: Medicare HMO

## 2022-06-20 ENCOUNTER — Other Ambulatory Visit: Payer: Medicare HMO

## 2022-06-22 ENCOUNTER — Other Ambulatory Visit: Payer: Self-pay | Admitting: Physical Medicine and Rehabilitation

## 2022-06-22 ENCOUNTER — Encounter: Payer: Medicare HMO | Attending: Physical Medicine and Rehabilitation | Admitting: Physical Medicine and Rehabilitation

## 2022-06-22 DIAGNOSIS — Z79891 Long term (current) use of opiate analgesic: Secondary | ICD-10-CM | POA: Insufficient documentation

## 2022-06-22 DIAGNOSIS — M25552 Pain in left hip: Secondary | ICD-10-CM | POA: Insufficient documentation

## 2022-06-22 DIAGNOSIS — I10 Essential (primary) hypertension: Secondary | ICD-10-CM | POA: Insufficient documentation

## 2022-06-22 DIAGNOSIS — G894 Chronic pain syndrome: Secondary | ICD-10-CM | POA: Insufficient documentation

## 2022-06-22 DIAGNOSIS — G4701 Insomnia due to medical condition: Secondary | ICD-10-CM | POA: Insufficient documentation

## 2022-06-22 DIAGNOSIS — M5416 Radiculopathy, lumbar region: Secondary | ICD-10-CM | POA: Insufficient documentation

## 2022-06-22 DIAGNOSIS — Z5181 Encounter for therapeutic drug level monitoring: Secondary | ICD-10-CM | POA: Insufficient documentation

## 2022-06-22 DIAGNOSIS — B001 Herpesviral vesicular dermatitis: Secondary | ICD-10-CM | POA: Insufficient documentation

## 2022-06-22 MED ORDER — MELOXICAM 15 MG PO TABS: ORAL_TABLET | ORAL | 3 refills | Status: DC

## 2022-06-22 MED ORDER — AMITRIPTYLINE HCL 25 MG PO TABS: 25.0000 mg | ORAL_TABLET | Freq: Every day | ORAL | 3 refills | Status: DC

## 2022-06-22 NOTE — Progress Notes (Signed)
Subjective:    Patient ID: Danica Camarena, female    DOB: Jul 02, 1946, 76 y.o.   MRN: [id number]  An audio/video tele-health visit is felt to be the most appropriate encounter for this patient at this time. This is a follow up tele-visit via phone. The patient is at home. MD is at office. Prior to scheduling this appointment, our staff discussed the limitations of evaluation and management by telemedicine and the availability of in-person appointments. The patient expressed understanding and agreed to proceed.   Mrs. Cipriani is a 76 year old woman who presents for f/u of chronic neurogenic claudication  1) Left humeral fracture: She has an XR scheduled next month. She was told that the way the bone is broken it is going to take a long time to heal. She takes five Percocet per day.   2) Neuropathic pain in her left leg> right: She has had headache with Cymbalta. Discussed Nucynta as an option.  -she is currently getting no pain relief from Percocet -she remembers getting more pain relief from oxycodone 15 in the past  -she did not find Gabapentin helpful and would not like to try this again.  -pain is worst at night -she finds amitriptyline somewhat helpful but does not want to increase the dose.  -worse with standing, walking, sitting. Heating pads do not help, hot shower does not help. Gabapentin caused foul smelling urine.  -not sleeping well at night due to the constant pain -pain radiates down the posterior aspect of both legs -Elavil has helped the pain in the past.  -she wants to retry the oxycodone since that helped her in the past.  Tramadol is not effective at all.  -uses turmeric in her coffee every day.  -patient states she is not getting any benefit from oxycodone currently but this has been the only thing that has helped her in the past so she is not sure why it is not working for her anymore -she does not want to increase the Elavil further -she does not want to try  Savella -she is currently taking 2 oxycodone '10mg'$  HCL daily and not getting any relief -she is severely limited in her function by pain  3) Lower extremity swelling: much improved.   4) HTN: BP has been well controlled at home.   5) Insomnia: She has been sleeping no more than 4 tablets per day. She takes Tramadol at night to help her sleep.   6) Left hip pain: -pain is so severe that she was unable to go get her lumbar MIR and she had to reschedule this. -she would like to try meloxicam again -she understands to not take it with other NSAIDs -she would also like to increase amitriptyline and oxycodone dose -she would like to get XR of her hip   Prior history:  XR shows humeral neck impaction fracture with hemarthrosis. I advised her to go to the ED. She tells me today that she received bilateral arm injections for pain relief in the ED. She cannot recall the name of the medication.   She says she was not seen by an orthopedist and was sent home without any pain medication given that she is under pain contract with me and should have enough of her Percocet left given that she received her last refill on 06/19/20 ([place] reviewed).   She was able to call the orthopedist who did her right shoulder replacement. I advised her to try to get an appointment today as possible. She was able  to do so at 10am so I advised her to go to this instead of come in for our appointment, and hence our phone appointment in place today.   Pain Inventory Average Pain 9 Pain Right Now 9 My pain is constant, sharp, burning and aching  In the last 24 hours, has pain interfered with the following? General activity 9 Relation with others 1 Enjoyment of life 9 What TIME of day is your pain at its worst?  all Sleep (in general) Poor  Pain is worse with: walking, bending, sitting, standing and some activites Pain improves with: medication Relief from Meds: 8   No family history on file. Social History    Socioeconomic History   Marital status: Divorced    Spouse name: Not on file   Number of children: Not on file   Years of education: Not on file   Highest education level: Not on file  Occupational History   Not on file  Tobacco Use   Smoking status: Never   Smokeless tobacco: Never  Vaping Use   Vaping Use: Never used  Substance and Sexual Activity   Alcohol use: No   Drug use: No   Sexual activity: Not on file  Other Topics Concern   Not on file  Social History Narrative   Not on file   Social Determinants of Health   Financial Resource Strain: Not on file  Food Insecurity: Not on file  Transportation Needs: Not on file  Physical Activity: Not on file  Stress: Not on file  Social Connections: Not on file   Past Surgical History:  Procedure Laterality Date   APPENDECTOMY     BREAST SURGERY Left    lymphnodes removed in the left arm X2   REVERSE SHOULDER ARTHROPLASTY Right 05/25/2017   REVERSE SHOULDER ARTHROPLASTY Right 05/25/2017   Procedure: REVERSE SHOULDER ARTHROPLASTY;  Surgeon: Tania Ade, MD;  Location: [facility];  Service: Orthopedics;  Laterality: Right;  RIGHT REVERSE TOTAL SHOULDER ARTHROPLASTY   Past Medical History:  Diagnosis Date   Arthritis    right shoulder   Complication of anesthesia    hard to wake up   History of kidney stones    Hypertension    Hypothyroidism    Pneumonia    history   RSD upper limb    left    Skin cancer    chest   There were no vitals taken for this visit.  Opioid Risk Score:   Fall Risk Score:  `1  Depression screen [facility] 2/9     05/03/2022    9:25 AM 11/18/2020    2:42 PM 05/20/2020    2:33 PM  Depression screen PHQ 2/9  Decreased Interest 0 0 0  Down, Depressed, Hopeless 0 0 0  PHQ - 2 Score 0 0 0    Review of Systems  Constitutional: Negative.   HENT: Negative.    Eyes: Negative.   Respiratory: Negative.    Cardiovascular:  Positive for leg swelling.  Gastrointestinal: Negative.   Endocrine:  Negative.   Genitourinary:  Positive for urgency.  Musculoskeletal:  Positive for back pain and gait problem.  Skin: Negative.   Allergic/Immunologic: Negative.   Neurological:  Positive for numbness.       Tingling  Hematological: Negative.   Psychiatric/Behavioral:         Anxiety, Depression  All other systems reviewed and are negative.      Objective:   Physical Exam Not performed as patient was seen via  phone    Assessment & Plan:  Mrs. Kiger is a 76 year old woman who presents for follow-up of left humeral fracture following a fall, as well as spinal stenosis.   1) History of left humeral fracture: healed  2) Lumbar anterolisthesis: Lumbar MRI 07/2019:  IMPRESSION: 1. Transitional lumbosacral anatomy with partial sacralization of the L5 segment. 2. No significant interval progression of multilevel lumbar spondylosis, most pronounced at the L3-4 level where there is severe stenosis of the bilateral subarticular recesses and severe canal stenosis. 3. Grade 1 anterolisthesis L3 on L4 and L4 on L5.  -Continue HEP -Continue hot water showers.  -urine screen today -continue Elavil '10mg'$  HS -If urine sample contains the expected metabolities, can prescribe Percocet '5mg'$  twice day.  -Provided with a pain relief journal and discussed that it contains foods and lifestyle tips to naturally help to improve pain. Discussed that these lifestyle strategies are also very good for health unlike some medications which can have negative side effects. Discussed that the act of keeping a journal can be therapeutic and helpful to realize patterns what helps to trigger and alleviate pain.   -Given neuropathic pain and side effects with nerve agents, will start Nucynta daily and decrease the Percocet to 4 tabs daily. May releast on 1/17.   -s/p Steroid dose pack -d/c gabapentin given lack of efficacy -will not retry cymbalta since this gave her headache -switch to '15mg'$  oxycodone oblong  tablets BID when due for next refill (I asked patient to contact her pharmacy to ask what the difference is between her current medication which is not helping her and the medication she received that helped her in the past and she was told that the only difference is that the tablets she received before were in oblong form, but they had the same ingredients) -discussed increasing Elavil or starting [place] but she prefers not to try -Routine drug screens have had expected metabolites.  -Continue turmeric daily with black pepper.  -Continue daily cinnamon.  -Discussed ESI as an option for her pain, she will let me know if she wants to do this.  -Encouraged hot peppers.   -Discussed following foods that may reduce pain: 1) Ginger 2) Blueberries 3) Salmon 4) Pumpkin seeds 5) dark chocolate 6) turmeric 7) tart cherries 8) virgin olive oil 9) chilli peppers 10) mint  Link to further information on diet for chronic pain: [email or web address].[email or web address]/   Turmeric to reduce inflammation--can be used in cooking or taken as a supplement.  Benefits of turmeric:  -Highly anti-inflammatory  -Increases antioxidants  -Improves memory, attention, brain disease  -Lowers risk of heart disease  -May help prevent cancer  -Decreases pain  -Alleviates depression  -Delays aging and decreases risk of chronic disease  -Consume with black pepper to increase absorption    Turmeric Milk Recipe:  1 cup milk  1 tsp turmeric  1 tsp cinnamon  1 tsp grated ginger (optional)  Black pepper (boosts the anti-inflammatory properties of turmeric).  1 tsp honey   Insomnia: -continue Amitriptyline '10mg'$  HS  Left hip pain: -increase amitriptyline to '25mg'$  -increase oxycodone when next refill is due to '15mg'$  BID PRN oblong tablet as per patient's request -left hip XR ordered Meloxicam '15mg'$  daily prn ordered, advised to not take  other NSAIDs with this due to bleeding risk -discussed PT which she would like to consider in the future  23 minutes spent in discussion of current pain, location, the ways in which it limites her, her current response to oxycodone  and Elavil, options of increasing Elavil, adding Savella, or increasing oxycodone dose or frequency, discussing when her follow-up with Zella Ball is and when she is due for her next refill, reminding her that we can not send new medication scripts untol refills are due, discussion of other medications which she has failed before

## 2022-06-28 ENCOUNTER — Encounter (HOSPITAL_BASED_OUTPATIENT_CLINIC_OR_DEPARTMENT_OTHER): Payer: Medicare HMO | Admitting: Registered Nurse

## 2022-06-28 ENCOUNTER — Encounter: Payer: Self-pay | Admitting: Registered Nurse

## 2022-06-28 VITALS — BP 195/74 | HR 75 | Ht <= 58 in | Wt 127.0 lb

## 2022-06-28 DIAGNOSIS — Z5181 Encounter for therapeutic drug level monitoring: Secondary | ICD-10-CM

## 2022-06-28 DIAGNOSIS — Z79891 Long term (current) use of opiate analgesic: Secondary | ICD-10-CM | POA: Diagnosis not present

## 2022-06-28 DIAGNOSIS — M5416 Radiculopathy, lumbar region: Secondary | ICD-10-CM | POA: Diagnosis not present

## 2022-06-28 DIAGNOSIS — I10 Essential (primary) hypertension: Secondary | ICD-10-CM | POA: Diagnosis not present

## 2022-06-28 DIAGNOSIS — M25552 Pain in left hip: Secondary | ICD-10-CM

## 2022-06-28 DIAGNOSIS — G4701 Insomnia due to medical condition: Secondary | ICD-10-CM | POA: Diagnosis not present

## 2022-06-28 DIAGNOSIS — G894 Chronic pain syndrome: Secondary | ICD-10-CM

## 2022-06-28 DIAGNOSIS — B001 Herpesviral vesicular dermatitis: Secondary | ICD-10-CM | POA: Diagnosis not present

## 2022-06-28 MED ORDER — OXYCODONE HCL 15 MG PO TABS: 15.0000 mg | ORAL_TABLET | Freq: Two times a day (BID) | ORAL | 0 refills | Status: DC | PRN

## 2022-06-28 NOTE — Progress Notes (Signed)
Subjective:    Patient ID: Marie Burns, female    DOB: October 09, 1946, 76 y.o.   MRN: [id number]  HPI: Marie Burns is a 76 y.o. female who returns for follow up appointment for chronic pain and medication refill. She states her pain is located in her lower back pain radiating into her buttocks and bilateral lower extremities L>R. Also reports left hip pain. She rates  her  pain 8. Her current exercise regime is walking short distances.   [name] arrived with uncontrolled hypertension, she states she is complaint with her anti-hypertensive medications. She refuses ED or Urgent Care evaluation. She states she will call her PCP and keep a blood Pressure Log.   [name] Morphine equivalent is 30.00  MME.   Dr Ranell Patrick note was reviewed. Oxycodone will be changed to 15 mg per Dr Ranell Patrick note, she verbalizes understanding.     Pain Inventory Average Pain 10 Pain Right Now 8 My pain is constant, sharp, burning, and stabbing  In the last 24 hours, has pain interfered with the following? General activity 1 Relation with others 8 Enjoyment of life 10 What TIME of day is your pain at its worst? morning , daytime, evening, and night Sleep (in general) Fair  Pain is worse with: walking, bending, standing, and some activites Pain improves with: medication Relief from Meds: 3  No family history on file. Social History   Socioeconomic History   Marital status: Divorced    Spouse name: Not on file   Number of children: Not on file   Years of education: Not on file   Highest education level: Not on file  Occupational History   Not on file  Tobacco Use   Smoking status: Never   Smokeless tobacco: Never  Vaping Use   Vaping Use: Never used  Substance and Sexual Activity   Alcohol use: No   Drug use: No   Sexual activity: Not on file  Other Topics Concern   Not on file  Social History Narrative   Not on file   Social Determinants of Health   Financial Resource  Strain: Not on file  Food Insecurity: Not on file  Transportation Needs: Not on file  Physical Activity: Not on file  Stress: Not on file  Social Connections: Not on file   Past Surgical History:  Procedure Laterality Date   APPENDECTOMY     BREAST SURGERY Left    lymphnodes removed in the left arm X2   REVERSE SHOULDER ARTHROPLASTY Right 05/25/2017   REVERSE SHOULDER ARTHROPLASTY Right 05/25/2017   Procedure: REVERSE SHOULDER ARTHROPLASTY;  Surgeon: Tania Ade, MD;  Location: [place];  Service: Orthopedics;  Laterality: Right;  RIGHT REVERSE TOTAL SHOULDER ARTHROPLASTY   Past Surgical History:  Procedure Laterality Date   APPENDECTOMY     BREAST SURGERY Left    lymphnodes removed in the left arm X2   REVERSE SHOULDER ARTHROPLASTY Right 05/25/2017   REVERSE SHOULDER ARTHROPLASTY Right 05/25/2017   Procedure: REVERSE SHOULDER ARTHROPLASTY;  Surgeon: Tania Ade, MD;  Location: [place];  Service: Orthopedics;  Laterality: Right;  RIGHT REVERSE TOTAL SHOULDER ARTHROPLASTY   Past Medical History:  Diagnosis Date   Arthritis    right shoulder   Complication of anesthesia    hard to wake up   History of kidney stones    Hypertension    Hypothyroidism    Pneumonia    history   RSD upper limb    left    Skin cancer  chest   BP (!) 210/96   Pulse 76   Ht '4\' 10"'$  (1.473 m)   Wt 127 lb (57.6 kg)   SpO2 98%   BMI 26.54 kg/m   Opioid Risk Score:   Fall Risk Score:  `1  Depression screen [facility] 2/9     06/28/2022    2:29 PM 05/03/2022    9:25 AM 11/18/2020    2:42 PM 05/20/2020    2:33 PM  Depression screen PHQ 2/9  Decreased Interest 1 0 0 0  Down, Depressed, Hopeless 1 0 0 0  PHQ - 2 Score 2 0 0 0     Review of Systems  Musculoskeletal:  Positive for back pain.       Bilateral shoulder pain Bilateral leg pain  All other systems reviewed and are negative.     Objective:   Physical Exam Vitals and nursing note reviewed.  Cardiovascular:     Rate and Rhythm:  Normal rate and regular rhythm.     Pulses: Normal pulses.     Heart sounds: Normal heart sounds.  Pulmonary:     Effort: Pulmonary effort is normal.     Breath sounds: Normal breath sounds.  Musculoskeletal:     Cervical back: Normal range of motion and neck supple.     Comments: Normal Muscle Bulk and Muscle Testing Reveals:  Upper Extremities: Decreased ROM 45 Degrees and Muscle Strength 5/5 Lumbar Paraspinal Tenderness: L-3-L-5 Lower Extremities: Decreased ROM and Muscle Strength 5/5 Bilateral Lower Extremities Flexion Produces Pain into her bilateral hips, bilateral lower extremities and bilateral feet Arises form table slowly Antalgic  Gait     Skin:    General: Skin is warm and dry.  Neurological:     Mental Status: She is alert and oriented to person, place, and time.  Psychiatric:        Mood and Affect: Mood normal.        Behavior: Behavior normal.         Assessment & Plan:  Acute Exacerbation of Chronic Low Back Pain: MRI. Scheduled. Continue to Monitor. Awaiting Results. 06/28/2022 Chronic Bilateral Thoracic Back Pain: Continue HEP as Tolerated. Continue current medication regimen. Continue to Monitor. 06/28/2022 Lumbar Spinal Stenosis / Lumbar Radiculitis: Continue HEP as Tolerated. Continue amitriptyline . Continue to Monitor. 06/28/2022  Chronic Pain Syndrome: RX: Oxycodone 15 mg one tablet twice a day as needed or pain #60. Encouraged to keep a pain Journal, she verbalizes understanding.  We will continue the opioid monitoring program, this consists of regular clinic visits, examinations, urine drug screen, pill counts as well as use of [place] Controlled Substance Reporting system. A 12 month History has been reviewed on the [place] Controlled Substance Reporting System on 06/28/2022 6. Uncontrolled Hypertension: Blood Pressure re-checked. She refuses ED / Urgent Care evaluation. She states she will call her PCP and Keep a Blood Pressure Log. She  verbalizes understanding. At the end of her visit she was asked to go to urgent care or ED for evaluation, she refused again. She states she will call her PCP.   F/U in Month

## 2022-06-30 ENCOUNTER — Ambulatory Visit
Admission: RE | Admit: 2022-06-30 | Discharge: 2022-06-30 | Disposition: A | Discharge status: Home / Self Care | Payer: Medicare HMO | Source: Ambulatory Visit | Attending: Physical Medicine and Rehabilitation | Admitting: Physical Medicine and Rehabilitation

## 2022-06-30 ENCOUNTER — Ambulatory Visit
Admission: RE | Admit: 2022-06-30 | Discharge: 2022-06-30 | Disposition: A | Discharge status: Home / Self Care | Payer: Medicare HMO | Source: Ambulatory Visit | Attending: Registered Nurse | Admitting: Registered Nurse

## 2022-06-30 DIAGNOSIS — M25552 Pain in left hip: Secondary | ICD-10-CM

## 2022-06-30 DIAGNOSIS — M545 Low back pain, unspecified: Secondary | ICD-10-CM | POA: Diagnosis not present

## 2022-06-30 DIAGNOSIS — M160 Bilateral primary osteoarthritis of hip: Secondary | ICD-10-CM | POA: Diagnosis not present

## 2022-06-30 DIAGNOSIS — M48061 Spinal stenosis, lumbar region without neurogenic claudication: Secondary | ICD-10-CM | POA: Diagnosis not present

## 2022-07-04 ENCOUNTER — Telehealth: Payer: Self-pay | Admitting: Registered Nurse

## 2022-07-04 NOTE — Telephone Encounter (Signed)
MRI Results reviewed. Noo change from previous MRI.  Results sent to Dr Ranell Patrick

## 2022-07-08 ENCOUNTER — Telehealth: Payer: Self-pay

## 2022-07-08 ENCOUNTER — Encounter (HOSPITAL_BASED_OUTPATIENT_CLINIC_OR_DEPARTMENT_OTHER): Payer: Medicare HMO | Admitting: Physical Medicine and Rehabilitation

## 2022-07-08 DIAGNOSIS — I10 Essential (primary) hypertension: Secondary | ICD-10-CM | POA: Diagnosis not present

## 2022-07-08 DIAGNOSIS — B001 Herpesviral vesicular dermatitis: Secondary | ICD-10-CM | POA: Diagnosis not present

## 2022-07-08 DIAGNOSIS — M25552 Pain in left hip: Secondary | ICD-10-CM

## 2022-07-08 DIAGNOSIS — G4701 Insomnia due to medical condition: Secondary | ICD-10-CM

## 2022-07-08 MED ORDER — ACYCLOVIR 5 % EX OINT: 1.0000 | TOPICAL_OINTMENT | CUTANEOUS | 3 refills | Status: DC

## 2022-07-08 MED ORDER — DICLOFENAC SODIUM 1 % EX GEL: 2.0000 g | Freq: Four times a day (QID) | CUTANEOUS | 3 refills | Status: DC

## 2022-07-08 NOTE — Telephone Encounter (Addendum)
Response for Acyclovir Ointment ([place]) 5%:  This request has received an Unfavorable outcome.  An Appeal may be available. Patient had been informed. She also has been advised to try the GoodRx card.

## 2022-07-08 NOTE — Progress Notes (Signed)
Subjective:    Patient ID: Marie Burns, female    DOB: September 21, 1946, 76 y.o.   MRN: [id number]  An audio/video tele-health visit is felt to be the most appropriate encounter for this patient at this time. This is a follow up tele-visit via phone. The patient is at home. MD is at office. Prior to scheduling this appointment, our staff discussed the limitations of evaluation and management by telemedicine and the availability of in-person appointments. The patient expressed understanding and agreed to proceed.   Marie Burns is a 76 year old woman who presents for f/u of chronic neurogenic claudication  1) Left humeral fracture: Healed.    2) Neuropathic pain in her left leg> right: She has had headache with Cymbalta. Discussed Nucynta as an option.  -has had significant improvement with oxycodone -she is currently taking oxycodone '15mg'$  BID and her relief does not last until the next dose -she did not find Gabapentin helpful and would not like to try this again. She feels it made her urine smell foul -pain is worst at night and she sleeps poorly due to this -she finds amitriptyline somewhat helpful but does not want to increase the dose.  -worse with standing, walking, sitting. Heating pads do not help, hot shower does not help. Gabapentin caused foul smelling urine.  -not sleeping well at night due to the constant pain -pain radiates down the posterior aspect of both legs -Elavil has helped the pain in the past.  -she wants to retry the oxycodone since that helped her in the past.  Tramadol is not effective at all.  -uses turmeric in her coffee every day.  -patient states she is not getting any benefit from oxycodone currently but this has been the only thing that has helped her in the past so she is not sure why it is not working for her anymore -she does not want to increase the Elavil further -she does not want to try Savella -she is currently taking 2 oxycodone '10mg'$  HCL daily and  not getting any relief -she is severely limited in her function by pain  3) Lower extremity swelling: much improved.   4) HTN: BP has been well controlled at home.  120s/70s and she checks 3 times per day  5) Insomnia: She has been sleeping no more than 4 hours per night.  6) Left hip pain: -pain is so severe that she was unable to go get her lumbar MIR and she had to reschedule this. -she would like to try meloxicam again -she understands to not take it with other NSAIDs -she would also like to increase amitriptyline and oxycodone dose -she would like to get XR of her hip   Prior history:  XR shows humeral neck impaction fracture with hemarthrosis. I advised her to go to the ED. She tells me today that she received bilateral arm injections for pain relief in the ED. She cannot recall the name of the medication.   She says she was not seen by an orthopedist and was sent home without any pain medication given that she is under pain contract with me and should have enough of her Percocet left given that she received her last refill on 06/19/20 ([place] reviewed).   She was able to call the orthopedist who did her right shoulder replacement. I advised her to try to get an appointment today as possible. She was able to do so at 10am so I advised her to go to this instead of come in for  our appointment, and hence our phone appointment in place today.   Pain Inventory Average Pain 9 Pain Right Now 9 My pain is constant, sharp, burning and aching  In the last 24 hours, has pain interfered with the following? General activity 9 Relation with others 1 Enjoyment of life 9 What TIME of day is your pain at its worst?  all Sleep (in general) Poor  Pain is worse with: walking, bending, sitting, standing and some activites Pain improves with: medication Relief from Meds: 8   No family history on file. Social History   Socioeconomic History   Marital status: Divorced    Spouse name: Not on  file   Number of children: Not on file   Years of education: Not on file   Highest education level: Not on file  Occupational History   Not on file  Tobacco Use   Smoking status: Never   Smokeless tobacco: Never  Vaping Use   Vaping Use: Never used  Substance and Sexual Activity   Alcohol use: No   Drug use: No   Sexual activity: Not on file  Other Topics Concern   Not on file  Social History Narrative   Not on file   Social Determinants of Health   Financial Resource Strain: Not on file  Food Insecurity: Not on file  Transportation Needs: Not on file  Physical Activity: Not on file  Stress: Not on file  Social Connections: Not on file   Past Surgical History:  Procedure Laterality Date   APPENDECTOMY     BREAST SURGERY Left    lymphnodes removed in the left arm X2   REVERSE SHOULDER ARTHROPLASTY Right 05/25/2017   REVERSE SHOULDER ARTHROPLASTY Right 05/25/2017   Procedure: REVERSE SHOULDER ARTHROPLASTY;  Surgeon: Tania Ade, MD;  Location: [place];  Service: Orthopedics;  Laterality: Right;  RIGHT REVERSE TOTAL SHOULDER ARTHROPLASTY   Past Medical History:  Diagnosis Date   Arthritis    right shoulder   Complication of anesthesia    hard to wake up   History of kidney stones    Hypertension    Hypothyroidism    Pneumonia    history   RSD upper limb    left    Skin cancer    chest   There were no vitals taken for this visit.  Opioid Risk Score:   Fall Risk Score:  `1  Depression screen [facility] 2/9     06/28/2022    2:29 PM 05/03/2022    9:25 AM 11/18/2020    2:42 PM 05/20/2020    2:33 PM  Depression screen PHQ 2/9  Decreased Interest 1 0 0 0  Down, Depressed, Hopeless 1 0 0 0  PHQ - 2 Score 2 0 0 0    Review of Systems  Constitutional: Negative.   HENT: Negative.    Eyes: Negative.   Respiratory: Negative.    Cardiovascular:  Positive for leg swelling.  Gastrointestinal: Negative.   Endocrine: Negative.   Genitourinary:  Positive for urgency.   Musculoskeletal:  Positive for back pain and gait problem.  Skin: Negative.   Allergic/Immunologic: Negative.   Neurological:  Positive for numbness.       Tingling  Hematological: Negative.   Psychiatric/Behavioral:         Anxiety, Depression  All other systems reviewed and are negative.      Objective:   Physical Exam Not performed as patient was seen via phone    Assessment & Plan:  Mrs.  Burns is a 76 year old woman who presents for follow-up of left humeral fracture following a fall, as well as spinal stenosis.   1) History of left humeral fracture: healed  2) Lumbar anterolisthesis: Lumbar MRI 07/2019:  IMPRESSION: 1. Transitional lumbosacral anatomy with partial sacralization of the L5 segment. 2. No significant interval progression of multilevel lumbar spondylosis, most pronounced at the L3-4 level where there is severe stenosis of the bilateral subarticular recesses and severe canal stenosis. 3. Grade 1 anterolisthesis L3 on L4 and L4 on L5.  -Continue HEP -Continue hot water showers.  -urine screen today -continue Elavil '10mg'$  HS -UDS reviewed and stable -Provided with a pain relief journal and discussed that it contains foods and lifestyle tips to naturally help to improve pain. Discussed that these lifestyle strategies are also very good for health unlike some medications which can have negative side effects. Discussed that the act of keeping a journal can be therapeutic and helpful to realize patterns what helps to trigger and alleviate pain.   -Given neuropathic pain and side effects with nerve agents, will start Nucynta daily and decrease the Percocet to 4 tabs daily. May releast on 1/17.   -discussed that she has had success with steroid dose pack in the past -d/c gabapentin given lack of efficacy -will not retry cymbalta since this gave her headache -continue '15mg'$  oxycodone oblong tablets BID, discussed improved pain relief with this medication.   -discussed gabapentin but she had a negative response with this medication in the past, she feels that it caused her urine to smell foul -discussed increasing Elavil or starting Savella but she prefers not to try given that amitriptyline makes her feel too groggy during the day. Discussed decreasing amitriptyline dose but she defers as does not want her sleep to worsen -Routine drug screens have had expected metabolites.  -Continue turmeric daily with black pepper.  -Continue daily cinnamon.  -Discussed ESI as an option for her pain, she will let me know if she wants to do this.  -Encouraged hot peppers.   -Discussed following foods that may reduce pain: 1) Ginger 2) Blueberries 3) Salmon 4) Pumpkin seeds 5) dark chocolate 6) turmeric 7) tart cherries 8) virgin olive oil 9) chilli peppers 10) mint  Link to further information on diet for chronic pain: [email or web address].randall.[email or web address]/   Turmeric to reduce inflammation--can be used in cooking or taken as a supplement.  Benefits of turmeric:  -Highly anti-inflammatory  -Increases antioxidants  -Improves memory, attention, brain disease  -Lowers risk of heart disease  -May help prevent cancer  -Decreases pain  -Alleviates depression  -Delays aging and decreases risk of chronic disease  -Consume with black pepper to increase absorption    Turmeric Milk Recipe:  1 cup milk  1 tsp turmeric  1 tsp cinnamon  1 tsp grated ginger (optional)  Black pepper (boosts the anti-inflammatory properties of turmeric).  1 tsp honey   Insomnia: -continue Amitriptyline '10mg'$  HS  Left hip pain: -increase amitriptyline to '25mg'$  -increase oxycodone when next refill is due to '15mg'$  BID PRN oblong tablet as per patient's request -left hip XR ordered and shows mild degeneration Meloxicam '15mg'$  daily prn ordered, advised to not take other NSAIDs with this due to bleeding  risk -discussed PT which she would like to consider in the future  Herpes cold sore -topical acyclovir ordered  41 minutes spent in discussing her improved pain with increase in oxycodone dose, her continued break through pain during the day, the mechanism by which oxycodone  causes pain relief in the body, recommended getting RF and ANA when she is next in clinic with me given her positive response to meloxicam to assess for RA although OA is most likely, reviewed her recent hip XR results with her which show mild degeneration, ordered topical acyclovir q4H as needed for her herpes cold sore, discussed that she gets cold sores about three times per year and oral acyclovir can be used as a preventative, discussed applying voltaren gel to her painful hand joints, discussed decreasing amitriptyline given grogginess during the day but she defers at this time as does not want to sleep worse as a result

## 2022-07-11 ENCOUNTER — Telehealth: Payer: Self-pay

## 2022-07-11 NOTE — Telephone Encounter (Signed)
Call back phone [phone].  Marie Burns is having uncontrolled back pain. She took the Oxycodone 15 MB, 3-4 hours ago with no relief. Current pain level is at an 8 our of 10 with a heating pad.  She wants to know if she can take one and one half tabs for Oxycodone for relief?   Pain has been advised the message will be sent to [facility]. Who may have left  the office for the day. If no relief go to an urgent care.

## 2022-07-12 ENCOUNTER — Other Ambulatory Visit: Payer: Self-pay

## 2022-07-12 NOTE — Telephone Encounter (Addendum)
Return Marie Burns call,  Spoke with Dr Ranell Patrick regarding Marie Burns phone call. Call placed to Marie Burns, we will continue current medication regimen, alternate with heat and ice therapy. She was instructed to go to the Emergency Room if her is not controlled,she verbalizes understanding.  Dr Ranell Patrick agrees with the above. [name] understanding.

## 2022-07-14 DIAGNOSIS — G4700 Insomnia, unspecified: Secondary | ICD-10-CM | POA: Diagnosis not present

## 2022-07-14 DIAGNOSIS — G8929 Other chronic pain: Secondary | ICD-10-CM | POA: Diagnosis not present

## 2022-07-14 DIAGNOSIS — G43709 Chronic migraine without aura, not intractable, without status migrainosus: Secondary | ICD-10-CM | POA: Diagnosis not present

## 2022-07-14 DIAGNOSIS — I10 Essential (primary) hypertension: Secondary | ICD-10-CM | POA: Diagnosis not present

## 2022-07-14 DIAGNOSIS — Z1211 Encounter for screening for malignant neoplasm of colon: Secondary | ICD-10-CM | POA: Diagnosis not present

## 2022-07-14 DIAGNOSIS — E039 Hypothyroidism, unspecified: Secondary | ICD-10-CM | POA: Diagnosis not present

## 2022-07-14 DIAGNOSIS — Z9109 Other allergy status, other than to drugs and biological substances: Secondary | ICD-10-CM | POA: Diagnosis not present

## 2022-07-21 ENCOUNTER — Encounter: Payer: Self-pay | Admitting: Registered Nurse

## 2022-07-21 ENCOUNTER — Encounter: Payer: Medicare HMO | Attending: Physical Medicine and Rehabilitation | Admitting: Registered Nurse

## 2022-07-21 VITALS — BP 133/72 | HR 66 | Ht <= 58 in | Wt 126.0 lb

## 2022-07-21 DIAGNOSIS — Z5181 Encounter for therapeutic drug level monitoring: Secondary | ICD-10-CM

## 2022-07-21 DIAGNOSIS — G894 Chronic pain syndrome: Secondary | ICD-10-CM | POA: Diagnosis not present

## 2022-07-21 DIAGNOSIS — M5416 Radiculopathy, lumbar region: Secondary | ICD-10-CM | POA: Diagnosis not present

## 2022-07-21 DIAGNOSIS — Z79891 Long term (current) use of opiate analgesic: Secondary | ICD-10-CM

## 2022-07-21 DIAGNOSIS — M7062 Trochanteric bursitis, left hip: Secondary | ICD-10-CM

## 2022-07-21 MED ORDER — OXYCODONE HCL 15 MG PO TABS
15.0000 mg | ORAL_TABLET | Freq: Two times a day (BID) | ORAL | 0 refills | Status: DC | PRN
Start: 2022-07-21 — End: 2022-08-16

## 2022-07-21 NOTE — Patient Instructions (Signed)
Call office on 08/15/2022 : regarding your Oxycodone.  Let the staff know I told you to call .

## 2022-07-21 NOTE — Progress Notes (Unsigned)
Subjective:    Patient ID: Marie Burns, female    DOB: 07-31-1946, 76 y.o.   MRN: [id number]  HPI: Marie Burns is a 76 y.o. female who returns for follow up appointment for chronic pain and medication refill. states *** pain is located in  ***. rates pain ***. current exercise regime is walking and performing stretching exercises.  [name] Morphine equivalent is *** MME.   UDS ordered today.      Pain Inventory Average Pain 8 Pain Right Now 6 My pain is constant, sharp, burning, stabbing, and aching  In the last 24 hours, has pain interfered with the following? General activity 7 Relation with others 8 Enjoyment of life 7 What TIME of day is your pain at its worst? varies Sleep (in general) Fair  Pain is worse with: walking, bending, standing, and some activites Pain improves with: medication Relief from Meds: 7  History reviewed. No pertinent family history. Social History   Socioeconomic History   Marital status: Divorced    Spouse name: Not on file   Number of children: Not on file   Years of education: Not on file   Highest education level: Not on file  Occupational History   Not on file  Tobacco Use   Smoking status: Never   Smokeless tobacco: Never  Vaping Use   Vaping Use: Never used  Substance and Sexual Activity   Alcohol use: No   Drug use: No   Sexual activity: Not on file  Other Topics Concern   Not on file  Social History Narrative   Not on file   Social Determinants of Health   Financial Resource Strain: Not on file  Food Insecurity: Not on file  Transportation Needs: Not on file  Physical Activity: Not on file  Stress: Not on file  Social Connections: Not on file   Past Surgical History:  Procedure Laterality Date   APPENDECTOMY     BREAST SURGERY Left    lymphnodes removed in the left arm X2   REVERSE SHOULDER ARTHROPLASTY Right 05/25/2017   REVERSE SHOULDER ARTHROPLASTY Right 05/25/2017   Procedure: REVERSE SHOULDER  ARTHROPLASTY;  Surgeon: Tania Ade, MD;  Location: [place];  Service: Orthopedics;  Laterality: Right;  RIGHT REVERSE TOTAL SHOULDER ARTHROPLASTY   Past Surgical History:  Procedure Laterality Date   APPENDECTOMY     BREAST SURGERY Left    lymphnodes removed in the left arm X2   REVERSE SHOULDER ARTHROPLASTY Right 05/25/2017   REVERSE SHOULDER ARTHROPLASTY Right 05/25/2017   Procedure: REVERSE SHOULDER ARTHROPLASTY;  Surgeon: Tania Ade, MD;  Location: [place];  Service: Orthopedics;  Laterality: Right;  RIGHT REVERSE TOTAL SHOULDER ARTHROPLASTY   Past Medical History:  Diagnosis Date   Arthritis    right shoulder   Complication of anesthesia    hard to wake up   History of kidney stones    Hypertension    Hypothyroidism    Pneumonia    history   RSD upper limb    left    Skin cancer    chest   BP 133/72   Pulse 66   Ht '4\' 10"'$  (1.473 m)   Wt 126 lb (57.2 kg)   SpO2 98%   BMI 26.33 kg/m   Opioid Risk Score:   Fall Risk Score:  `1  Depression screen PHQ 2/9     07/21/2022    1:10 PM 06/28/2022    2:29 PM 05/03/2022    9:25 AM 11/18/2020  2:42 PM 05/20/2020    2:33 PM  Depression screen PHQ 2/9  Decreased Interest 0 1 0 0 0  Down, Depressed, Hopeless 0 1 0 0 0  PHQ - 2 Score 0 2 0 0 0    Review of Systems  Musculoskeletal:  Positive for arthralgias and back pain.       Pain in both hips down to both legs Left upper arm  All other systems reviewed and are negative.      Objective:   Physical Exam        Assessment & Plan:  Acute Exacerbation of Chronic Low Back Pain: MRI. Scheduled. Continue to Monitor. Awaiting Results. 06/28/2022 Chronic Bilateral Thoracic Back Pain: Continue HEP as Tolerated. Continue current medication regimen. Continue to Monitor. 06/28/2022 Lumbar Spinal Stenosis / Lumbar Radiculitis: Continue HEP as Tolerated. Continue amitriptyline . Continue to Monitor. 06/28/2022  Chronic Pain Syndrome: RX: Oxycodone 15 mg one tablet  twice a day as needed or pain #60. Encouraged to keep a pain Journal, she verbalizes understanding.  We will continue the opioid monitoring program, this consists of regular clinic visits, examinations, urine drug screen, pill counts as well as use of [place] Controlled Substance Reporting system. A 12 month History has been reviewed on the [place] Controlled Substance Reporting System on 06/28/2022 6. Uncontrolled Hypertension: Blood Pressure re-checked. She refuses ED / Urgent Care evaluation. She states she will call her PCP and Keep a Blood Pressure Log. She verbalizes understanding. At the end of her visit she was asked to go to urgent care or ED for evaluation, she refused again. She states she will call her PCP.    F/U in Month

## 2022-07-25 ENCOUNTER — Telehealth: Payer: Self-pay

## 2022-07-25 NOTE — Telephone Encounter (Signed)
Return Marie Burns call, she was instructed to call office when she has a week supply of medication. She has an appointment with Dr Ranell Patrick in October. She verbalizes understanding.

## 2022-07-25 NOTE — Telephone Encounter (Signed)
Marie Burns wants to know why she needs to call you on 08/15/2022? Her next appointment is with Dr. Ranell Patrick.  And when do you want to see her back?     Call back phone number [phone].

## 2022-07-26 LAB — DRUG TOX MONITOR 1 W/CONF, ORAL FLD
Amphetamines: NEGATIVE ng/mL (ref <10)
Barbiturates: NEGATIVE ng/mL (ref <10)
Benzodiazepines: NEGATIVE ng/mL (ref <0.50)
Buprenorphine: NEGATIVE ng/mL (ref <0.10)
Cocaine: NEGATIVE ng/mL (ref <5.0)
Codeine: NEGATIVE ng/mL (ref <2.5)
Dihydrocodeine: NEGATIVE ng/mL (ref <2.5)
Fentanyl: NEGATIVE ng/mL (ref <0.10)
Heroin Metabolite: NEGATIVE ng/mL (ref <1.0)
Hydrocodone: NEGATIVE ng/mL (ref <2.5)
Hydromorphone: NEGATIVE ng/mL (ref <2.5)
MARIJUANA: NEGATIVE ng/mL (ref <2.5)
MDMA: NEGATIVE ng/mL (ref <10)
Meprobamate: NEGATIVE ng/mL (ref <2.5)
Methadone: NEGATIVE ng/mL (ref <5.0)
Morphine: NEGATIVE ng/mL (ref <2.5)
Nicotine Metabolite: NEGATIVE ng/mL (ref <5.0)
Norhydrocodone: NEGATIVE ng/mL (ref <2.5)
Noroxycodone: 14.4 ng/mL — ABNORMAL HIGH (ref <2.5)
Opiates: POSITIVE ng/mL — AB (ref <2.5)
Oxycodone: 100.3 ng/mL — ABNORMAL HIGH (ref <2.5)
Oxymorphone: NEGATIVE ng/mL (ref <2.5)
Phencyclidine: NEGATIVE ng/mL (ref <10)
Tapentadol: NEGATIVE ng/mL (ref <5.0)
Tramadol: NEGATIVE ng/mL (ref <5.0)
Zolpidem: NEGATIVE ng/mL (ref <5.0)

## 2022-07-26 LAB — DRUG TOX ALC METAB W/CON, ORAL FLD: Alcohol Metabolite: NEGATIVE ng/mL (ref <25)

## 2022-07-29 ENCOUNTER — Telehealth: Payer: Self-pay | Admitting: *Deleted

## 2022-07-29 NOTE — Telephone Encounter (Signed)
Oral swab drug screen was consistent for prescribed medications.  ?

## 2022-08-15 ENCOUNTER — Telehealth: Payer: Self-pay

## 2022-08-15 NOTE — Telephone Encounter (Signed)
Patient is calling for a refill on Oxycodone. Per PMP, last fill was 07/27/22. She has a procedure appointment with Dr. Ranell Patrick on 09/01/22.

## 2022-08-16 MED ORDER — OXYCODONE HCL 15 MG PO TABS
15.0000 mg | ORAL_TABLET | Freq: Two times a day (BID) | ORAL | 0 refills | Status: DC | PRN
Start: 2022-08-16 — End: 2022-09-26

## 2022-08-16 NOTE — Telephone Encounter (Signed)
Marie Burns has a scheduled appointment with Dr Ranell Patrick on 09/01/2022.  PMP was Reviewed.  Oxycodone e-scribed today to accommodate scheduled appointment. Marie Burns is aware of the above.

## 2022-08-22 ENCOUNTER — Ambulatory Visit: Payer: Medicare HMO | Admitting: Physical Medicine and Rehabilitation

## 2022-09-01 ENCOUNTER — Encounter: Payer: Medicare HMO | Attending: Physical Medicine and Rehabilitation | Admitting: Physical Medicine and Rehabilitation

## 2022-09-01 VITALS — BP 101/63 | HR 93 | Ht <= 58 in | Wt 124.0 lb

## 2022-09-01 DIAGNOSIS — M5416 Radiculopathy, lumbar region: Secondary | ICD-10-CM | POA: Insufficient documentation

## 2022-09-01 DIAGNOSIS — M62838 Other muscle spasm: Secondary | ICD-10-CM | POA: Diagnosis not present

## 2022-09-01 MED ORDER — TIZANIDINE HCL 4 MG PO TABS: 4.0000 mg | ORAL_TABLET | Freq: Every evening | ORAL | 3 refills | Status: DC | PRN

## 2022-09-01 NOTE — Patient Instructions (Signed)
Insomnia: -Try to go outside near sunrise -Get exercise during the day.  -Turn off all devices an hour before bedtime.  -Teas that can benefit: chamomile, valerian root, Brahmi (Bacopa) -Can consider over the counter melatonin, magnesium, and/or L-theanine. Melatonin is an anti-oxidant with multiple health benefits. Magnesium is involved in greater than 300 enzymatic reactions in the body and most of us are deficient as our soil is often depleted. There are 7 different types of magnesium- Bioptemizer's is a supplement with all 7 types, and each has unique benefits. Magnesium can also help with constipation and anxiety.  -Pistachios naturally increase the production of melatonin -[employer] bamboo bed sheets are free from toxic chemicals.  -Tart cherry juice or a tart cherry supplement can improve sleep and soreness post-workout  Foods that may reduce pain: 1) Ginger (especially studied for arthritis)- reduce leukotriene production to decrease inflammation 2) Blueberries- high in phytonutrients that decrease inflammation 3) Salmon- marine omega-3s reduce joint swelling and pain 4) Pumpkin seeds- reduce inflammation 5) dark chocolate- reduces inflammation 6) turmeric- reduces inflammation 7) tart cherries - reduce pain and stiffness 8) extra virgin olive oil - its compound olecanthal helps to block prostaglandins  9) chili peppers- can be eaten or applied topically via capsaicin 10) mint- helpful for headache, muscle aches, joint pain, and itching 11) garlic- reduces inflammation  Link to further information on diet for chronic pain: [email or web address]  

## 2022-09-01 NOTE — Progress Notes (Signed)
Subjective:    Patient ID: Marie Burns, female    DOB: [birth year], 76 y.o.   MRN: [id number]  Mrs. Mcclish is a 76 year old woman who presents for f/u of chronic neurogenic claudication  1) Left humeral fracture: Healed.    2) Neuropathic pain in her left leg> right: She has had headache with Cymbalta. Discussed Nucynta as an option.  -has had significant improvement with oxycodone -she would like to try Qutenza today -she would like to wean off her pain medication -she does not find the amitrptyline or mobic helpful -years ago mobic helped her -she did find the tizanidine helpful -she is currently taking oxycodone '15mg'$  BID and her relief does not last until the next dose -she did not find Gabapentin helpful and would not like to try this again. She feels it made her urine smell foul -pain is worst at night and she sleeps poorly due to this -she finds amitriptyline somewhat helpful but does not want to increase the dose.  -worse with standing, walking, sitting. Heating pads do not help, hot shower does not help. Gabapentin caused foul smelling urine.  -not sleeping well at night due to the constant pain -pain radiates down the posterior aspect of both legs -Elavil has helped the pain in the past.  -she wants to retry the oxycodone since that helped her in the past.  Tramadol is not effective at all.  -uses turmeric in her coffee every day.  -patient states she is not getting any benefit from oxycodone currently but this has been the only thing that has helped her in the past so she is not sure why it is not working for her anymore -she does not want to increase the Elavil further -she does not want to try Savella -she is currently taking 2 oxycodone '10mg'$  HCL daily and not getting any relief -she is severely limited in her function by pain  3) Lower extremity swelling: much improved.   4) HTN: BP has been well controlled at home.  120s/70s and she checks 3 times per  day  5) Insomnia: She has been sleeping no more than 4 hours per night. -last night she had a hard time sleeping  6) Left hip pain: -pain is so severe that she was unable to go get her lumbar MIR and she had to reschedule this. -she would like to try meloxicam again -she understands to not take it with other NSAIDs -she would also like to increase amitriptyline and oxycodone dose -she would like to get XR of her hip   Prior history:  XR shows humeral neck impaction fracture with hemarthrosis. I advised her to go to the ED. She tells me today that she received bilateral arm injections for pain relief in the ED. She cannot recall the name of the medication.   She says she was not seen by an orthopedist and was sent home without any pain medication given that she is under pain contract with me and should have enough of her Percocet left given that she received her last refill on 06/19/20 (Roxana reviewed).   She was able to call the orthopedist who did her right shoulder replacement. I advised her to try to get an appointment today as possible. She was able to do so at 10am so I advised her to go to this instead of come in for our appointment, and hence our phone appointment in place today.   Pain Inventory Average Pain 9 Pain Right Now 9 My pain  is constant, sharp, burning and aching  In the last 24 hours, has pain interfered with the following? General activity 9 Relation with others 1 Enjoyment of life 9 What TIME of day is your pain at its worst?  all Sleep (in general) Poor  Pain is worse with: walking, bending, sitting, standing and some activites Pain improves with: medication Relief from Meds: 8   No family history on file. Social History   Socioeconomic History   Marital status: Divorced    Spouse name: Not on file   Number of children: Not on file   Years of education: Not on file   Highest education level: Not on file  Occupational History   Not on file  Tobacco  Use   Smoking status: Never   Smokeless tobacco: Never  Vaping Use   Vaping Use: Never used  Substance and Sexual Activity   Alcohol use: No   Drug use: No   Sexual activity: Not on file  Other Topics Concern   Not on file  Social History Narrative   Not on file   Social Determinants of Health   Financial Resource Strain: Not on file  Food Insecurity: Not on file  Transportation Needs: Not on file  Physical Activity: Not on file  Stress: Not on file  Social Connections: Not on file   Past Surgical History:  Procedure Laterality Date   APPENDECTOMY     BREAST SURGERY Left    lymphnodes removed in the left arm X2   REVERSE SHOULDER ARTHROPLASTY Right 05/25/2017   REVERSE SHOULDER ARTHROPLASTY Right 05/25/2017   Procedure: REVERSE SHOULDER ARTHROPLASTY;  Surgeon: Tania Ade, MD;  Location: [facility];  Service: Orthopedics;  Laterality: Right;  RIGHT REVERSE TOTAL SHOULDER ARTHROPLASTY   Past Medical History:  Diagnosis Date   Arthritis    right shoulder   Complication of anesthesia    hard to wake up   History of kidney stones    Hypertension    Hypothyroidism    Pneumonia    history   RSD upper limb    left    Skin cancer    chest   BP 101/63   Pulse 93   Ht '4\' 10"'$  (1.473 m)   Wt 124 lb (56.2 kg)   SpO2 96%   BMI 25.92 kg/m   Opioid Risk Score:   Fall Risk Score:  `1  Depression screen PHQ 2/9     07/21/2022    1:10 PM 06/28/2022    2:29 PM 05/03/2022    9:25 AM 11/18/2020    2:42 PM 05/20/2020    2:33 PM  Depression screen PHQ 2/9  Decreased Interest 0 1 0 0 0  Down, Depressed, Hopeless 0 1 0 0 0  PHQ - 2 Score 0 2 0 0 0    Review of Systems  Constitutional: Negative.   HENT: Negative.    Eyes: Negative.   Respiratory: Negative.    Cardiovascular:  Positive for leg swelling.  Gastrointestinal: Negative.   Endocrine: Negative.   Genitourinary:  Positive for urgency.  Musculoskeletal:  Positive for back pain and gait problem.  Skin: Negative.    Allergic/Immunologic: Negative.   Neurological:  Positive for numbness.       Tingling  Hematological: Negative.   Psychiatric/Behavioral:         Anxiety, Depression  All other systems reviewed and are negative.      Objective:   Physical Exam Gen: no distress, normal appearing, BMI 25.92 HEENT:  oral mucosa pink and moist, NCAT Cardio: Reg rate Chest: normal effort, normal rate of breathing Abd: soft, non-distended Ext: no edema Psych: pleasant, normal affect Skin: intact Neuro: Loss of train of thought during coversation    Assessment & Plan:  Mrs. Yasin is a 76 year old woman who presents for follow-up of left humeral fracture following a fall, as well as spinal stenosis.   1) History of left humeral fracture: healed  2) Lumbar anterolisthesis: Lumbar MRI 07/2019:  IMPRESSION: 1. Transitional lumbosacral anatomy with partial sacralization of the L5 segment. 2. No significant interval progression of multilevel lumbar spondylosis, most pronounced at the L3-4 level where there is severe stenosis of the bilateral subarticular recesses and severe canal stenosis. 3. Grade 1 anterolisthesis L3 on L4 and L4 on L5. -Discussed Qutenza as an option for neuropathic pain control. Discussed that this is a capsaicin patch, stronger than capsaicin cream. Discussed that it is currently approved for diabetic peripheral neuropathy and post-herpetic neuralgia, but that it has also shown benefit in treating other forms of neuropathy. Provided patient with link to site to learn more about the patch: [employer]fr. Discussed that the patch would be placed in office and benefits usually last 3 months. Discussed that unintended exposure to capsaicin can cause severe irritation of eyes, mucous membranes, respiratory tract, and skin, but that Qutenza is a local treatment and does not have the systemic side effects of other nerve medications. Discussed that there may be pain, itching,  erythema, and decreased sensory function associated with the application of Qutenza. Side effects usually subside within 1 week. A cold pack of analgesic medications can help with these side effects. Blood pressure can also be increased due to pain associated with administration of the patch.   4 patches of Qutenza was applied to the area of pain. Ice packs were applied during the procedure to ensure patient comfort. Blood pressure was monitored every 15 minutes. The patient tolerated the procedure well. Post-procedure instructions were given and follow-up has been scheduled.    -Continue HEP -Continue hot water showers.  -urine screen today -continue Elavil '10mg'$  HS -UDS reviewed and stable -Provided with a pain relief journal and discussed that it contains foods and lifestyle tips to naturally help to improve pain. Discussed that these lifestyle strategies are also very good for health unlike some medications which can have negative side effects. Discussed that the act of keeping a journal can be therapeutic and helpful to realize patterns what helps to trigger and alleviate pain.   -Given neuropathic pain and side effects with nerve agents, will start Nucynta daily and decrease the Percocet to 4 tabs daily. May releast on 1/17.   -discussed that she has had success with steroid dose pack in the past -d/c gabapentin given lack of efficacy -will not retry cymbalta since this gave her headache -continue '15mg'$  oxycodone oblong tablets BID, discussed improved pain relief with this medication.  -discussed gabapentin but she had a negative response with this medication in the past, she feels that it caused her urine to smell foul -discussed increasing Elavil or starting Savella but she prefers not to try given that amitriptyline makes her feel too groggy during the day. Discussed decreasing amitriptyline dose but she defers as does not want her sleep to worsen -Routine drug screens have had expected  metabolites.  -Continue turmeric daily with black pepper.  -Continue daily cinnamon.  -Discussed ESI as an option for her pain, she will let me know if she wants to do  this.  -Encouraged hot peppers.   -Discussed following foods that may reduce pain: 1) Ginger 2) Blueberries 3) Salmon 4) Pumpkin seeds 5) dark chocolate 6) turmeric 7) tart cherries 8) virgin olive oil 9) chilli peppers 10) mint  Link to further information on diet for chronic pain: http://www.randall.com/   Turmeric to reduce inflammation--can be used in cooking or taken as a supplement.  Benefits of turmeric:  -Highly anti-inflammatory  -Increases antioxidants  -Improves memory, attention, brain disease  -Lowers risk of heart disease  -May help prevent cancer  -Decreases pain  -Alleviates depression  -Delays aging and decreases risk of chronic disease  -Consume with black pepper to increase absorption    Turmeric Milk Recipe:  1 cup milk  1 tsp turmeric  1 tsp cinnamon  1 tsp grated ginger (optional)  Black pepper (boosts the anti-inflammatory properties of turmeric).  1 tsp honey   Insomnia: -continue Amitriptyline '10mg'$  HS  Left hip pain: -increase amitriptyline to '25mg'$  -increase oxycodone when next refill is due to '15mg'$  BID PRN oblong tablet as per patient's request -left hip XR ordered and shows mild degeneration Meloxicam '15mg'$  daily prn ordered, advised to not take other NSAIDs with this due to bleeding risk -discussed PT which she would like to consider in the future  Herpes cold sore -topical acyclovir ordered  Insomnia: -Try to go outside near sunrise -Get exercise during the day.  -Turn off all devices an hour before bedtime.  -Teas that can benefit: chamomile, valerian root, Brahmi (Bacopa) -Can consider over the counter melatonin, magnesium, and/or L-theanine. Melatonin is an anti-oxidant with  multiple health benefits. Magnesium is involved in greater than 300 enzymatic reactions in the body and most of [place] are deficient as our soil is often depleted. There are 7 different types of magnesium- Bioptemizer's is a supplement with all 7 types, and each has unique benefits. Magnesium can also help with constipation and anxiety.  -Pistachios naturally increase the production of melatonin -[employer]zy Earth bamboo bed sheets are free from toxic chemicals.  -Tart cherry juice or a tart cherry supplement can improve sleep and soreness post-workout

## 2022-09-26 ENCOUNTER — Encounter: Payer: Self-pay | Admitting: Registered Nurse

## 2022-09-26 ENCOUNTER — Other Ambulatory Visit: Payer: Self-pay | Admitting: Physical Medicine and Rehabilitation

## 2022-09-26 ENCOUNTER — Encounter: Payer: Medicare HMO | Attending: Physical Medicine and Rehabilitation | Admitting: Registered Nurse

## 2022-09-26 ENCOUNTER — Other Ambulatory Visit (HOSPITAL_COMMUNITY): Payer: Self-pay

## 2022-09-26 VITALS — BP 184/76 | HR 76 | Ht <= 58 in | Wt 123.0 lb

## 2022-09-26 DIAGNOSIS — Z5181 Encounter for therapeutic drug level monitoring: Secondary | ICD-10-CM | POA: Insufficient documentation

## 2022-09-26 DIAGNOSIS — G894 Chronic pain syndrome: Secondary | ICD-10-CM | POA: Diagnosis not present

## 2022-09-26 DIAGNOSIS — M5416 Radiculopathy, lumbar region: Secondary | ICD-10-CM | POA: Diagnosis not present

## 2022-09-26 DIAGNOSIS — I10 Essential (primary) hypertension: Secondary | ICD-10-CM | POA: Insufficient documentation

## 2022-09-26 DIAGNOSIS — Z79891 Long term (current) use of opiate analgesic: Secondary | ICD-10-CM | POA: Diagnosis not present

## 2022-09-26 MED ORDER — CEPHALEXIN 500 MG PO CAPS
500.0000 mg | ORAL_CAPSULE | Freq: Two times a day (BID) | ORAL | 0 refills | Status: AC
Start: 2022-09-26 — End: 2022-10-03

## 2022-09-26 MED ORDER — OXYCODONE HCL 15 MG PO TABS
15.0000 mg | ORAL_TABLET | Freq: Two times a day (BID) | ORAL | 0 refills | Status: DC | PRN
Start: 2022-09-26 — End: 2022-09-26

## 2022-09-26 MED ORDER — OXYCODONE HCL 15 MG PO TABS
15.0000 mg | ORAL_TABLET | Freq: Two times a day (BID) | ORAL | 0 refills | Status: DC | PRN
  Filled 2022-09-26: qty 60, 30d supply, fill #0

## 2022-09-26 MED ORDER — OXYCODONE HCL 15 MG PO TABS: 15.0000 mg | ORAL_TABLET | Freq: Two times a day (BID) | ORAL | 0 refills | Status: DC | PRN

## 2022-09-26 NOTE — Progress Notes (Unsigned)
Subjective:    Patient ID: Marie Burns, female    DOB: 09-10-1946, 76 y.o.   MRN: [id number]  HPI: Marie Burns is a 77 y.o. female who returns for follow up appointment for chronic pain and medication refill. states *** pain is located in  ***. rates pain ***. current exercise regime is walking and performing stretching exercises.  Marie Burns Morphine equivalent is *** MME.   Last UDS was Performed on 07/21/2022, it was consistent.    Pain Inventory Average Pain 9 Pain Right Now 5 My pain is sharp, burning, and tingling  In the last 24 hours, has pain interfered with the following? General activity 5 Relation with others 5 Enjoyment of life 6 What TIME of day is your pain at its worst? varies Sleep (in general) Poor  Pain is worse with: walking, sitting, and standing Pain improves with: medication Relief from Meds: 7  No family history on file. Social History   Socioeconomic History   Marital status: Divorced    Spouse name: Not on file   Number of children: Not on file   Years of education: Not on file   Highest education level: Not on file  Occupational History   Not on file  Tobacco Use   Smoking status: Never   Smokeless tobacco: Never  Vaping Use   Vaping Use: Never used  Substance and Sexual Activity   Alcohol use: No   Drug use: No   Sexual activity: Not on file  Other Topics Concern   Not on file  Social History Narrative   Not on file   Social Determinants of Health   Financial Resource Strain: Not on file  Food Insecurity: Not on file  Transportation Needs: Not on file  Physical Activity: Not on file  Stress: Not on file  Social Connections: Not on file   Past Surgical History:  Procedure Laterality Date   APPENDECTOMY     BREAST SURGERY Left    lymphnodes removed in the left arm X2   REVERSE SHOULDER ARTHROPLASTY Right 05/25/2017   REVERSE SHOULDER ARTHROPLASTY Right 05/25/2017   Procedure: REVERSE SHOULDER ARTHROPLASTY;   Surgeon: Tania Ade, MD;  Location: [facility];  Service: Orthopedics;  Laterality: Right;  RIGHT REVERSE TOTAL SHOULDER ARTHROPLASTY   Past Surgical History:  Procedure Laterality Date   APPENDECTOMY     BREAST SURGERY Left    lymphnodes removed in the left arm X2   REVERSE SHOULDER ARTHROPLASTY Right 05/25/2017   REVERSE SHOULDER ARTHROPLASTY Right 05/25/2017   Procedure: REVERSE SHOULDER ARTHROPLASTY;  Surgeon: Tania Ade, MD;  Location: [facility];  Service: Orthopedics;  Laterality: Right;  RIGHT REVERSE TOTAL SHOULDER ARTHROPLASTY   Past Medical History:  Diagnosis Date   Arthritis    right shoulder   Complication of anesthesia    hard to wake up   History of kidney stones    Hypertension    Hypothyroidism    Pneumonia    history   RSD upper limb    left    Skin cancer    chest   There were no vitals taken for this visit.  Opioid Risk Score:   Fall Risk Score:  `1  Depression screen PHQ 2/9     07/21/2022    1:10 PM 06/28/2022    2:29 PM 05/03/2022    9:25 AM 11/18/2020    2:42 PM 05/20/2020    2:33 PM  Depression screen PHQ 2/9  Decreased Interest 0 1 0 0 0  Down, Depressed, Hopeless 0 1 0 0 0  PHQ - 2 Score 0 2 0 0 0      Review of Systems  Musculoskeletal:  Positive for back pain.       B/L arm pain B/L leg pain  All other systems reviewed and are negative.     Objective:   Physical Exam        Assessment & Plan:

## 2022-10-03 ENCOUNTER — Telehealth: Payer: Self-pay | Admitting: Registered Nurse

## 2022-10-03 ENCOUNTER — Other Ambulatory Visit (HOSPITAL_COMMUNITY): Payer: Self-pay

## 2022-10-03 MED ORDER — OXYCODONE HCL 15 MG PO TABS
15.0000 mg | ORAL_TABLET | Freq: Two times a day (BID) | ORAL | 0 refills | Status: DC | PRN
  Filled 2022-10-03 – 2022-10-25 (×2): qty 60, 30d supply, fill #0

## 2022-10-03 NOTE — Telephone Encounter (Signed)
PMP was Reviewed.  Oxycodone e- scribed to pharmacy, with a do not refill date. To accommodate scheduled appointment.Ms. Frier is aware of the above and verbalizes understanding.

## 2022-10-03 NOTE — Telephone Encounter (Signed)
Return Ms. Umbarger call, We reviewed her prescription, she verbalizes understanding.

## 2022-10-03 NOTE — Telephone Encounter (Signed)
Patient is scheduled to come back and see Zella Ball on 12/19, but she is confused about her medications.  She thinks Zella Ball has said she will refill her medications because she will be out of town.  Please call patient to clarify.  Thank you.

## 2022-10-13 ENCOUNTER — Telehealth: Payer: Self-pay | Admitting: *Deleted

## 2022-10-13 ENCOUNTER — Other Ambulatory Visit (HOSPITAL_COMMUNITY): Payer: Self-pay

## 2022-10-13 NOTE — Telephone Encounter (Signed)
Mrs Keahey called and is requesting a call back from Stacy.

## 2022-10-13 NOTE — Telephone Encounter (Signed)
I spoke with Marie Burns yesterday.

## 2022-10-24 ENCOUNTER — Other Ambulatory Visit (HOSPITAL_COMMUNITY): Payer: Self-pay

## 2022-10-25 ENCOUNTER — Other Ambulatory Visit (HOSPITAL_COMMUNITY): Payer: Self-pay

## 2022-10-26 ENCOUNTER — Other Ambulatory Visit (HOSPITAL_COMMUNITY): Payer: Self-pay

## 2022-10-27 ENCOUNTER — Encounter: Payer: Medicare HMO | Attending: Physical Medicine and Rehabilitation | Admitting: Physical Medicine and Rehabilitation

## 2022-10-27 DIAGNOSIS — G894 Chronic pain syndrome: Secondary | ICD-10-CM | POA: Insufficient documentation

## 2022-10-27 DIAGNOSIS — M5416 Radiculopathy, lumbar region: Secondary | ICD-10-CM | POA: Diagnosis not present

## 2022-10-27 DIAGNOSIS — Z79891 Long term (current) use of opiate analgesic: Secondary | ICD-10-CM | POA: Insufficient documentation

## 2022-10-27 DIAGNOSIS — Z5181 Encounter for therapeutic drug level monitoring: Secondary | ICD-10-CM | POA: Insufficient documentation

## 2022-10-27 MED ORDER — ONDANSETRON HCL 4 MG PO TABS: 4.0000 mg | ORAL_TABLET | Freq: Three times a day (TID) | ORAL | 0 refills | Status: DC | PRN

## 2022-10-27 MED ORDER — OXYCODONE-ACETAMINOPHEN 10-325 MG PO TABS: 1.0000 | ORAL_TABLET | Freq: Three times a day (TID) | ORAL | 0 refills | Status: DC | PRN

## 2022-10-27 MED ORDER — AMITRIPTYLINE HCL 10 MG PO TABS: 10.0000 mg | ORAL_TABLET | Freq: Every day | ORAL | 1 refills | Status: DC

## 2022-10-27 NOTE — Progress Notes (Signed)
Subjective:    Patient ID: Marie Burns, female    DOB: 1946-05-04, 76 y.o.   MRN: [id number]  Marie Burns is a 76 year old woman who presents for f/u of chronic neurogenic claudication  1) Left humeral fracture: Healed.    2) Neuropathic pain in her left leg> right: She has had headache with Cymbalta. Discussed Nucynta as an option.  -has had significant improvement with oxycodone -she would like to try Qutenza today -she would like to wean off her pain medication -she does not find the amitrptyline or mobic helpful -years ago mobic helped her -she did find the tizanidine helpful -she is currently taking oxycodone '15mg'$  BID and her relief does not last until the next dose -she did not find Gabapentin helpful and would not like to try this again. She feels it made her urine smell foul -pain is worst at night and she sleeps poorly due to this -she finds amitriptyline somewhat helpful but does not want to increase the dose.  -worse with standing, walking, sitting. Heating pads do not help, hot shower does not help. Gabapentin caused foul smelling urine.  -not sleeping well at night due to the constant pain -pain radiates down the posterior aspect of both legs -Elavil has helped the pain in the past.  -she wants to retry the oxycodone since that helped her in the past.  Tramadol is not effective at all.  -uses turmeric in her coffee every day.  -patient states she is not getting any benefit from oxycodone currently but this has been the only thing that has helped her in the past so she is not sure why it is not working for her anymore -she does not want to increase the Elavil further -she does not want to try Savella -she is currently taking 2 oxycodone '10mg'$  HCL daily and not getting any relief -she is severely limited in her function by pain  3) Lower extremity swelling: much improved.   4) HTN: BP has been well controlled at home.  120s/70s and she checks 3 times per  day  5) Insomnia: She has been sleeping no more than 4 hours per night. -last night she had a hard time sleeping  6) Left hip pain: -pain is so severe that she was unable to go get her lumbar MIR and she had to reschedule this. -she would like to try meloxicam again -she understands to not take it with other NSAIDs -she would also like to increase amitriptyline and oxycodone dose -she would like to get XR of her hip  7) Nausea -she has been throwing up after taking oxycodone ever since she picked up her last refill -she is concerned that she received a bad batch and cannot tolerate taking the medicine, but her pain is very severe -she would like to try percocet instead   Prior history:  XR shows humeral neck impaction fracture with hemarthrosis. I advised her to go to the ED. She tells me today that she received bilateral arm injections for pain relief in the ED. She cannot recall the name of the medication.   She says she was not seen by an orthopedist and was sent home without any pain medication given that she is under pain contract with me and should have enough of her Percocet left given that she received her last refill on 06/19/20 ([place] reviewed).   She was able to call the orthopedist who did her right shoulder replacement. I advised her to try to get an appointment  today as possible. She was able to do so at 10am so I advised her to go to this instead of come in for our appointment, and hence our phone appointment in place today.   Pain Inventory Average Pain 9 Pain Right Now 9 My pain is constant, sharp, burning and aching  In the last 24 hours, has pain interfered with the following? General activity 9 Relation with others 1 Enjoyment of life 9 What TIME of day is your pain at its worst?  all Sleep (in general) Poor  Pain is worse with: walking, bending, sitting, standing and some activites Pain improves with: medication Relief from Meds: 8   No family history on  file. Social History   Socioeconomic History   Marital status: Divorced    Spouse name: Not on file   Number of children: Not on file   Years of education: Not on file   Highest education level: Not on file  Occupational History   Not on file  Tobacco Use   Smoking status: Never   Smokeless tobacco: Never  Vaping Use   Vaping Use: Never used  Substance and Sexual Activity   Alcohol use: No   Drug use: No   Sexual activity: Not on file  Other Topics Concern   Not on file  Social History Narrative   Not on file   Social Determinants of Health   Financial Resource Strain: Not on file  Food Insecurity: Not on file  Transportation Needs: Not on file  Physical Activity: Not on file  Stress: Not on file  Social Connections: Not on file   Past Surgical History:  Procedure Laterality Date   APPENDECTOMY     BREAST SURGERY Left    lymphnodes removed in the left arm X2   REVERSE SHOULDER ARTHROPLASTY Right 05/25/2017   REVERSE SHOULDER ARTHROPLASTY Right 05/25/2017   Procedure: REVERSE SHOULDER ARTHROPLASTY;  Surgeon: Tania Ade, MD;  Location: [facility];  Service: Orthopedics;  Laterality: Right;  RIGHT REVERSE TOTAL SHOULDER ARTHROPLASTY   Past Medical History:  Diagnosis Date   Arthritis    right shoulder   Complication of anesthesia    hard to wake up   History of kidney stones    Hypertension    Hypothyroidism    Pneumonia    history   RSD upper limb    left    Skin cancer    chest   There were no vitals taken for this visit.  Opioid Risk Score:   Fall Risk Score:  `1  Depression screen PHQ 2/9     09/26/2022    1:55 PM 07/21/2022    1:10 PM 06/28/2022    2:29 PM 05/03/2022    9:25 AM 11/18/2020    2:42 PM 05/20/2020    2:33 PM  Depression screen PHQ 2/9  Decreased Interest 0 0 1 0 0 0  Down, Depressed, Hopeless 0 0 1 0 0 0  PHQ - 2 Score 0 0 2 0 0 0    Review of Systems  Constitutional: Negative.   HENT: Negative.    Eyes: Negative.    Respiratory: Negative.    Cardiovascular:  Positive for leg swelling.  Gastrointestinal: Negative.   Endocrine: Negative.   Genitourinary:  Positive for urgency.  Musculoskeletal:  Positive for back pain and gait problem.  Skin: Negative.   Allergic/Immunologic: Negative.   Neurological:  Positive for numbness.       Tingling  Hematological: Negative.   Psychiatric/Behavioral:  Anxiety, Depression  All other systems reviewed and are negative.      Objective:   Physical Exam Gen: no distress, normal appearing, BMI 25.92 HEENT: oral mucosa pink and moist, NCAT Cardio: Reg rate Chest: normal effort, normal rate of breathing Abd: soft, non-distended Ext: no edema Psych: pleasant, normal affect Skin: intact Neuro: Loss of train of thought during coversation    Assessment & Plan:  Marie Burns is a 76 year old woman who presents for follow-up of left humeral fracture following a fall, as well as spinal stenosis.   1) History of left humeral fracture: healed  2) Lumbar anterolisthesis: Lumbar MRI 07/2019:  IMPRESSION: 1. Transitional lumbosacral anatomy with partial sacralization of the L5 segment. 2. No significant interval progression of multilevel lumbar spondylosis, most pronounced at the L3-4 level where there is severe stenosis of the bilateral subarticular recesses and severe canal stenosis. 3. Grade 1 anterolisthesis L3 on L4 and L4 on L5.  -failed Qutenza  -prescribed percocet '10mg'$  TID PRN  -prescribed amitriptyline '10mg'$  HS  -Continue HEP -Continue hot water showers.  -urine screen today -continue Elavil '10mg'$  HS -UDS reviewed and stable -Provided with a pain relief journal and discussed that it contains foods and lifestyle tips to naturally help to improve pain. Discussed that these lifestyle strategies are also very good for health unlike some medications which can have negative side effects. Discussed that the act of keeping a journal can be  therapeutic and helpful to realize patterns what helps to trigger and alleviate pain.   -Given neuropathic pain and side effects with nerve agents, will start Nucynta daily and decrease the Percocet to 4 tabs daily. May releast on 1/17.   -discussed that she has had success with steroid dose pack in the past -d/c gabapentin given lack of efficacy -will not retry cymbalta since this gave her headache -requested that she bring in her oxycodone for [place] to destroy -discussed gabapentin but she had a negative response with this medication in the past, she feels that it caused her urine to smell foul -discussed increasing Elavil or starting Savella but she prefers not to try given that amitriptyline makes her feel too groggy during the day. Discussed decreasing amitriptyline dose but she defers as does not want her sleep to worsen -Routine drug screens have had expected metabolites.  -Continue turmeric daily with black pepper.  -Continue daily cinnamon.  -Discussed ESI as an option for her pain, she will let me know if she wants to do this.  -Encouraged hot peppers.   -Discussed following foods that may reduce pain: 1) Ginger 2) Blueberries 3) Salmon 4) Pumpkin seeds 5) dark chocolate 6) turmeric 7) tart cherries 8) virgin olive oil 9) chilli peppers 10) mint  Link to further information on diet for chronic pain: [email or web address].[email or web address]   Turmeric to reduce inflammation--can be used in cooking or taken as a supplement.  Benefits of turmeric:  -Highly anti-inflammatory  -Increases antioxidants  -Improves memory, attention, brain disease  -Lowers risk of heart disease  -May help prevent cancer  -Decreases pain  -Alleviates depression  -Delays aging and decreases risk of chronic disease  -Consume with black pepper to increase absorption    Turmeric Milk Recipe:  1 cup milk  1 tsp turmeric  1 tsp  cinnamon  1 tsp grated ginger (optional)  Black pepper (boosts the anti-inflammatory properties of turmeric).  1 tsp honey   Insomnia: -continue Amitriptyline '10mg'$  HS  Left hip pain: -increase amitriptyline to '25mg'$  -prescribed percocet '10mg'$  TID  PRN -left hip XR ordered and shows mild degeneration Meloxicam '15mg'$  daily prn ordered, advised to not take other NSAIDs with this due to bleeding risk -discussed PT which she would like to consider in the future  Herpes cold sore -topical acyclovir ordered  Insomnia: -Try to go outside near sunrise -Get exercise during the day.  -Turn off all devices an hour before bedtime.  -Teas that can benefit: chamomile, valerian root, Brahmi (Bacopa) -Can consider over the counter melatonin, magnesium, and/or L-theanine. Melatonin is an anti-oxidant with multiple health benefits. Magnesium is involved in greater than 300 enzymatic reactions in the body and most of [place] are deficient as our soil is often depleted. There are 7 different types of magnesium- Bioptemizer's is a supplement with all 7 types, and each has unique benefits. Magnesium can also help with constipation and anxiety.  -Pistachios naturally increase the production of melatonin -[employer] bamboo bed sheets are free from toxic chemicals.  -Tart cherry juice or a tart cherry supplement can improve sleep and soreness post-workout  22 minutes spent in discussion with patient regarding her pain, changing oxycodone to percocet since she appeared to have a bad bath, sending zofran to help with her nausea, additional time spent calling pharmacy to authorize release of percocet

## 2022-11-01 ENCOUNTER — Encounter: Payer: Self-pay | Admitting: Registered Nurse

## 2022-11-01 ENCOUNTER — Encounter (HOSPITAL_BASED_OUTPATIENT_CLINIC_OR_DEPARTMENT_OTHER): Payer: Medicare HMO | Admitting: Registered Nurse

## 2022-11-01 VITALS — BP 135/72 | HR 86 | Ht <= 58 in | Wt 122.0 lb

## 2022-11-01 DIAGNOSIS — Z79891 Long term (current) use of opiate analgesic: Secondary | ICD-10-CM | POA: Diagnosis not present

## 2022-11-01 DIAGNOSIS — Z5181 Encounter for therapeutic drug level monitoring: Secondary | ICD-10-CM | POA: Diagnosis not present

## 2022-11-01 DIAGNOSIS — M5416 Radiculopathy, lumbar region: Secondary | ICD-10-CM | POA: Diagnosis not present

## 2022-11-01 DIAGNOSIS — G894 Chronic pain syndrome: Secondary | ICD-10-CM | POA: Diagnosis not present

## 2022-11-01 MED ORDER — OXYCODONE HCL 15 MG PO TABS: 15.0000 mg | ORAL_TABLET | Freq: Two times a day (BID) | ORAL | 0 refills | Status: DC | PRN

## 2022-11-01 NOTE — Progress Notes (Signed)
Subjective:    Patient ID: Marie Burns, female    DOB: [birth year], 76 y.o.   MRN: [id number]  HPI: Marie Burns is a 76 y.o. female who returns for follow up appointment for chronic pain and medication refill. She states her pain is located in her lower back pain radiating into her bilateral lower extremities. She rates her pain 9. Her current exercise regime is walking  short distances.  Marie Burns Morphine equivalent is 45.00 MME.   Last Oral swab was Performed on 07/21/2022, it was consistent.    Pain Inventory Average Pain 7 Pain Right Now 9 My pain is burning and aching  In the last 24 hours, has pain interfered with the following? General activity 5 Relation with others 5 Enjoyment of life 5 What TIME of day is your pain at its worst? morning , daytime, evening, and night Sleep (in general) Fair  Pain is worse with: walking, bending, sitting, standing, and some activites Pain improves with: medication Relief from Meds: 3  History reviewed. No pertinent family history. Social History   Socioeconomic History   Marital status: Divorced    Spouse name: Not on file   Number of children: Not on file   Years of education: Not on file   Highest education level: Not on file  Occupational History   Not on file  Tobacco Use   Smoking status: Never   Smokeless tobacco: Never  Vaping Use   Vaping Use: Never used  Substance and Sexual Activity   Alcohol use: No   Drug use: No   Sexual activity: Not on file  Other Topics Concern   Not on file  Social History Narrative   Not on file   Social Determinants of Health   Financial Resource Strain: Not on file  Food Insecurity: Not on file  Transportation Needs: Not on file  Physical Activity: Not on file  Stress: Not on file  Social Connections: Not on file   Past Surgical History:  Procedure Laterality Date   APPENDECTOMY     BREAST SURGERY Left    lymphnodes removed in the left arm X2   REVERSE  SHOULDER ARTHROPLASTY Right 05/25/2017   REVERSE SHOULDER ARTHROPLASTY Right 05/25/2017   Procedure: REVERSE SHOULDER ARTHROPLASTY;  Surgeon: Tania Ade, MD;  Location: [facility];  Service: Orthopedics;  Laterality: Right;  RIGHT REVERSE TOTAL SHOULDER ARTHROPLASTY   Past Surgical History:  Procedure Laterality Date   APPENDECTOMY     BREAST SURGERY Left    lymphnodes removed in the left arm X2   REVERSE SHOULDER ARTHROPLASTY Right 05/25/2017   REVERSE SHOULDER ARTHROPLASTY Right 05/25/2017   Procedure: REVERSE SHOULDER ARTHROPLASTY;  Surgeon: Tania Ade, MD;  Location: [facility];  Service: Orthopedics;  Laterality: Right;  RIGHT REVERSE TOTAL SHOULDER ARTHROPLASTY   Past Medical History:  Diagnosis Date   Arthritis    right shoulder   Complication of anesthesia    hard to wake up   History of kidney stones    Hypertension    Hypothyroidism    Pneumonia    history   RSD upper limb    left    Skin cancer    chest   Ht '4\' 10"'$  (1.473 m)   BMI 25.71 kg/m   Opioid Risk Score:   Fall Risk Score:  `1  Depression screen PHQ 2/9     09/26/2022    1:55 PM 07/21/2022    1:10 PM 06/28/2022    2:29 PM 05/03/2022  9:25 AM 11/18/2020    2:42 PM 05/20/2020    2:33 PM  Depression screen PHQ 2/9  Decreased Interest 0 0 1 0 0 0  Down, Depressed, Hopeless 0 0 1 0 0 0  PHQ - 2 Score 0 0 2 0 0 0      Review of Systems  Musculoskeletal:  Positive for back pain and gait problem.  All other systems reviewed and are negative.     Objective:   Physical Exam Vitals and nursing note reviewed.  Constitutional:      Appearance: Normal appearance.  Cardiovascular:     Rate and Rhythm: Normal rate and regular rhythm.     Pulses: Normal pulses.     Heart sounds: Normal heart sounds.  Pulmonary:     Effort: Pulmonary effort is normal.     Breath sounds: Normal breath sounds.  Musculoskeletal:     Cervical back: Normal range of motion and neck supple.     Comments: Normal Muscle Bulk  and Muscle Testing Reveals:  Upper Extremities: Decreased ROM  45 Degrees and Muscle Strength  5/5 Bilateral AC Joint Tenderness  Lumbar Hypersensitivity  Lower Extremities: Full ROM and Muscle Strength 5/5 Arises from Table with ease Narrow Based Gait     Skin:    General: Skin is warm and dry.  Neurological:     Mental Status: She is alert and oriented to person, place, and time.  Psychiatric:        Mood and Affect: Mood normal.        Behavior: Behavior normal.         Assessment & Plan:  1.Chronic Bilateral Thoracic Back Pain: No complaints today. Continue HEP as Tolerated. Continue current medication regimen. Continue to Monitor. 11/01/2022 2. Lumbar Spinal Stenosis / Lumbar Radiculitis: Continue HEP as Tolerated. Continue amitriptyline . Continue to Monitor. 11/01/2022 3 Chronic Pain Syndrome: Refilled : Oxycodone 15 mg one tablet twice a day as needed or pain #60. We will continue the opioid monitoring program, this consists of regular clinic visits, examinations, urine drug screen, pill counts as well as use of [place] Controlled Substance Reporting system. A 12 month History has been reviewed on the [place] Controlled Substance Reporting System on 11/01/2022   F/U in Month

## 2022-11-02 ENCOUNTER — Other Ambulatory Visit (HOSPITAL_COMMUNITY): Payer: Self-pay

## 2022-11-15 ENCOUNTER — Encounter: Payer: Medicare HMO | Attending: Physical Medicine and Rehabilitation | Admitting: Physical Medicine and Rehabilitation

## 2022-11-15 DIAGNOSIS — M48062 Spinal stenosis, lumbar region with neurogenic claudication: Secondary | ICD-10-CM | POA: Insufficient documentation

## 2022-11-15 DIAGNOSIS — Z638 Other specified problems related to primary support group: Secondary | ICD-10-CM | POA: Insufficient documentation

## 2022-11-15 DIAGNOSIS — R4189 Other symptoms and signs involving cognitive functions and awareness: Secondary | ICD-10-CM | POA: Insufficient documentation

## 2022-11-15 DIAGNOSIS — M5416 Radiculopathy, lumbar region: Secondary | ICD-10-CM | POA: Insufficient documentation

## 2022-11-15 DIAGNOSIS — G4701 Insomnia due to medical condition: Secondary | ICD-10-CM | POA: Insufficient documentation

## 2022-11-15 DIAGNOSIS — Z818 Family history of other mental and behavioral disorders: Secondary | ICD-10-CM | POA: Insufficient documentation

## 2022-11-15 MED ORDER — OXYCODONE HCL 15 MG PO TABS: 15.0000 mg | ORAL_TABLET | Freq: Three times a day (TID) | ORAL | 0 refills | Status: DC | PRN

## 2022-11-15 NOTE — Progress Notes (Signed)
Subjective:    Patient ID: Marie Burns, female    DOB: Jan 05, 1946, 77 y.o.   MRN: [id number]  An audio/video tele-health visit is felt to be the most appropriate encounter for this patient at this time. This is a follow up tele-visit via phone. The patient is at home. MD is at office. Prior to scheduling this appointment, our staff discussed the limitations of evaluation and management by telemedicine and the availability of in-person appointments. The patient expressed understanding and agreed to proceed.   Marie Burns is a 77 year old woman who presents for f/u of chronic neurogenic claudication  1) Left humeral fracture: Healed.    2) Neuropathic pain in her left leg> right: She has had headache with Cymbalta. Discussed Nucynta as an option.  -has had significant improvement with oxycodone -she does not want to try Qutenza as it did not seem to help -she would like to wean off her pain medication -she does not find the amitrptyline or mobic helpful -she needs refill of her oxycodone -she feels nauseous from the green pills and was told this may be due to sensitivity to the green dye used in the pill -her pain is severe and she asks whether she can take the 55moxycodone three times per day.  -years ago mobic helped her -she did find the tizanidine helpful -she is currently taking oxycodone '15mg'$  BID and her relief does not last until the next dose -she did not find Gabapentin helpful and would not like to try this again. She feels it made her urine smell foul -pain is worst at night and she sleeps poorly due to this -she finds amitriptyline somewhat helpful but does not want to increase the dose.  -worse with standing, walking, sitting. Heating pads do not help, hot shower does not help. Gabapentin caused foul smelling urine.  -not sleeping well at night due to the constant pain -pain radiates down the posterior aspect of both legs -Elavil has helped the pain in the past.   -she wants to retry the oxycodone since that helped her in the past.  Tramadol is not effective at all.  -uses turmeric in her coffee every day.  -patient states she is not getting any benefit from oxycodone currently but this has been the only thing that has helped her in the past so she is not sure why it is not working for her anymore -she does not want to increase the Elavil further -she does not want to try Savella -she is currently taking 2 oxycodone '10mg'$  HCL daily and not getting any relief -she is severely limited in her function by pain  3) Lower extremity swelling: much improved.   4) HTN: BP has been well controlled at home.  120s/70s and she checks 3 times per day  5) Insomnia: She has been sleeping no more than 4 hours per night. -last night she had a hard time sleeping  6) Left hip pain: -pain is so severe that she was unable to go get her lumbar MIR and she had to reschedule this. -she would like to try meloxicam again -she understands to not take it with other NSAIDs -she would also like to increase amitriptyline and oxycodone dose -she would like to get XR of her hip  7) Nausea -she has been throwing up after taking oxycodone ever since she picked up her last refill -she is concerned that she received a bad batch and cannot tolerate taking the medicine, but her pain is very severe -  she would like to try percocet instead   Prior history:  XR shows humeral neck impaction fracture with hemarthrosis. I advised her to go to the ED. She tells me today that she received bilateral arm injections for pain relief in the ED. She cannot recall the name of the medication.   She says she was not seen by an orthopedist and was sent home without any pain medication given that she is under pain contract with me and should have enough of her Percocet left given that she received her last refill on 06/19/20 ([place] reviewed).   She was able to call the orthopedist who did her right  shoulder replacement. I advised her to try to get an appointment today as possible. She was able to do so at 10am so I advised her to go to this instead of come in for our appointment, and hence our phone appointment in place today.   Pain Inventory Average Pain 9 Pain Right Now 9 My pain is constant, sharp, burning and aching  In the last 24 hours, has pain interfered with the following? General activity 9 Relation with others 1 Enjoyment of life 9 What TIME of day is your pain at its worst?  all Sleep (in general) Poor  Pain is worse with: walking, bending, sitting, standing and some activites Pain improves with: medication Relief from Meds: 8   No family history on file. Social History   Socioeconomic History   Marital status: Divorced    Spouse name: Not on file   Number of children: Not on file   Years of education: Not on file   Highest education level: Not on file  Occupational History   Not on file  Tobacco Use   Smoking status: Never   Smokeless tobacco: Never  Vaping Use   Vaping Use: Never used  Substance and Sexual Activity   Alcohol use: No   Drug use: No   Sexual activity: Not on file  Other Topics Concern   Not on file  Social History Narrative   Not on file   Social Determinants of Health   Financial Resource Strain: Not on file  Food Insecurity: Not on file  Transportation Needs: Not on file  Physical Activity: Not on file  Stress: Not on file  Social Connections: Not on file   Past Surgical History:  Procedure Laterality Date   APPENDECTOMY     BREAST SURGERY Left    lymphnodes removed in the left arm X2   REVERSE SHOULDER ARTHROPLASTY Right 05/25/2017   REVERSE SHOULDER ARTHROPLASTY Right 05/25/2017   Procedure: REVERSE SHOULDER ARTHROPLASTY;  Surgeon: Tania Ade, MD;  Location: [place];  Service: Orthopedics;  Laterality: Right;  RIGHT REVERSE TOTAL SHOULDER ARTHROPLASTY   Past Medical History:  Diagnosis Date   Arthritis     right shoulder   Complication of anesthesia    hard to wake up   History of kidney stones    Hypertension    Hypothyroidism    Pneumonia    history   RSD upper limb    left    Skin cancer    chest   There were no vitals taken for this visit.  Opioid Risk Score:   Fall Risk Score:  `1  Depression screen PHQ 2/9     09/26/2022    1:55 PM 07/21/2022    1:10 PM 06/28/2022    2:29 PM 05/03/2022    9:25 AM 11/18/2020    2:42 PM 05/20/2020  2:33 PM  Depression screen PHQ 2/9  Decreased Interest 0 0 1 0 0 0  Down, Depressed, Hopeless 0 0 1 0 0 0  PHQ - 2 Score 0 0 2 0 0 0    Review of Systems  Constitutional: Negative.   HENT: Negative.    Eyes: Negative.   Respiratory: Negative.    Cardiovascular:  Positive for leg swelling.  Gastrointestinal: Negative.   Endocrine: Negative.   Genitourinary:  Positive for urgency.  Musculoskeletal:  Positive for back pain and gait problem.  Skin: Negative.   Allergic/Immunologic: Negative.   Neurological:  Positive for numbness.       Tingling  Hematological: Negative.   Psychiatric/Behavioral:         Anxiety, Depression  All other systems reviewed and are negative.      Objective:   Physical Exam Gen: no distress, normal appearing, BMI 25.92 HEENT: oral mucosa pink and moist, NCAT Cardio: Reg rate Chest: normal effort, normal rate of breathing Abd: soft, non-distended Ext: no edema Psych: pleasant, normal affect Skin: intact Neuro: Loss of train of thought during coversation    Assessment & Plan:  Mrs. Lockner is a 77 year old woman who presents for follow-up of left humeral fracture following a fall, as well as spinal stenosis.   1) History of left humeral fracture: healed  2) Lumbar anterolisthesis: Lumbar MRI 07/2019:  IMPRESSION: 1. Transitional lumbosacral anatomy with partial sacralization of the L5 segment. 2. No significant interval progression of multilevel lumbar spondylosis, most pronounced at the L3-4  level where there is severe stenosis of the bilateral subarticular recesses and severe canal stenosis. 3. Grade 1 anterolisthesis L3 on L4 and L4 on L5.  -failed Qutenza  -increase oxycodone '15mg'$  to TID, wrote on script not to use green dye as she seems to be sensitive to this.  -discussed that she has called local pharmacies and has been having a hard time finding pharamacies that make oxycodone without the green dye  -prescribed amitriptyline '10mg'$  HS  -Continue HEP -Continue hot water showers.  -urine screen today -continue Elavil '10mg'$  HS -UDS reviewed and stable -Provided with a pain relief journal and discussed that it contains foods and lifestyle tips to naturally help to improve pain. Discussed that these lifestyle strategies are also very good for health unlike some medications which can have negative side effects. Discussed that the act of keeping a journal can be therapeutic and helpful to realize patterns what helps to trigger and alleviate pain.   -Given neuropathic pain and side effects with nerve agents, will start Nucynta daily and decrease the Percocet to 4 tabs daily. May releast on 1/17.   -discussed that she has had success with steroid dose pack in the past -d/c gabapentin given lack of efficacy -will not retry cymbalta since this gave her headache -requested that she bring in her oxycodone for [place] to destroy -discussed gabapentin but she had a negative response with this medication in the past, she feels that it caused her urine to smell foul -discussed increasing Elavil or starting Savella but she prefers not to try given that amitriptyline makes her feel too groggy during the day. Discussed decreasing amitriptyline dose but she defers as does not want her sleep to worsen -Routine drug screens have had expected metabolites.  -Continue turmeric daily with black pepper.  -Continue daily cinnamon.  -Discussed ESI as an option for her pain, she will let me know if she  wants to do this.  -Encouraged hot peppers.   -  Discussed following foods that may reduce pain: 1) Ginger 2) Blueberries 3) Salmon 4) Pumpkin seeds 5) dark chocolate 6) turmeric 7) tart cherries 8) virgin olive oil 9) chilli peppers 10) mint  Link to further information on diet for chronic pain: [email or web address].[email or web address]   Turmeric to reduce inflammation--can be used in cooking or taken as a supplement.  Benefits of turmeric:  -Highly anti-inflammatory  -Increases antioxidants  -Improves memory, attention, brain disease  -Lowers risk of heart disease  -May help prevent cancer  -Decreases pain  -Alleviates depression  -Delays aging and decreases risk of chronic disease  -Consume with black pepper to increase absorption    Turmeric Milk Recipe:  1 cup milk  1 tsp turmeric  1 tsp cinnamon  1 tsp grated ginger (optional)  Black pepper (boosts the anti-inflammatory properties of turmeric).  1 tsp honey   Insomnia: -continue Amitriptyline '10mg'$  HS  Left hip pain: -increase amitriptyline to '25mg'$  -prescribed percocet '10mg'$  TID PRN -left hip XR ordered and shows mild degeneration Meloxicam '15mg'$  daily prn ordered, advised to not take other NSAIDs with this due to bleeding risk -discussed PT which she would like to consider in the future  Herpes cold sore -topical acyclovir ordered  Insomnia: -Try to go outside near sunrise -Get exercise during the day.  -Turn off all devices an hour before bedtime.  -Teas that can benefit: chamomile, valerian root, Brahmi (Bacopa) -Can consider over the counter melatonin, magnesium, and/or L-theanine. Melatonin is an anti-oxidant with multiple health benefits. Magnesium is involved in greater than 300 enzymatic reactions in the body and most of [place] are deficient as our soil is often depleted. There are 7 different types of magnesium- Bioptemizer's is a  supplement with all 7 types, and each has unique benefits. Magnesium can also help with constipation and anxiety.  -Pistachios naturally increase the production of melatonin -[employer] bamboo bed sheets are free from toxic chemicals.  -Tart cherry juice or a tart cherry supplement can improve sleep and soreness post-workout  10 minutes spent in discussion of her spinal pain, increasing oxycodone to '15mg'$  TID, writing on the script to not use green dye, discussed that patient has called multiple pharmacies trying to find one that will make the medicaiton without the green dye

## 2022-11-16 ENCOUNTER — Other Ambulatory Visit: Payer: Self-pay | Admitting: Physical Medicine and Rehabilitation

## 2022-11-16 MED ORDER — OXYCODONE-ACETAMINOPHEN 10-325 MG PO TABS: 1.0000 | ORAL_TABLET | Freq: Three times a day (TID) | ORAL | 0 refills | Status: DC | PRN

## 2022-11-17 ENCOUNTER — Telehealth: Payer: Self-pay | Admitting: Physical Medicine and Rehabilitation

## 2022-11-17 NOTE — Telephone Encounter (Signed)
Needs confirmation on pain regimen.

## 2022-11-17 NOTE — Telephone Encounter (Signed)
Roderic Palau with [facility] needs a call back about medication Oxycodone 10/325.  Please call him at [phone].

## 2022-11-22 ENCOUNTER — Encounter (HOSPITAL_BASED_OUTPATIENT_CLINIC_OR_DEPARTMENT_OTHER): Payer: Medicare HMO | Admitting: Physical Medicine and Rehabilitation

## 2022-11-22 DIAGNOSIS — M48062 Spinal stenosis, lumbar region with neurogenic claudication: Secondary | ICD-10-CM

## 2022-11-22 NOTE — Progress Notes (Signed)
Subjective:    Patient ID: Marie Burns, female    DOB: [birth year], 77 y.o.   MRN: [id number]  An audio/video tele-health visit is felt to be the most appropriate encounter for this patient at this time. This is a follow up tele-visit via phone. The patient is at home. MD is at office. Prior to scheduling this appointment, our staff discussed the limitations of evaluation and management by telemedicine and the availability of in-person appointments. The patient expressed understanding and agreed to proceed.   Marie Burns is a 77 year old woman who presents for f/u of chronic neurogenic claudication  1) Left humeral fracture: Healed.    2) Neuropathic pain in her left leg> right: She has had headache with Cymbalta. Discussed Nucynta as an option.  -she needs to schedule transport a few days prior to appointments -has had significant improvement with the '15mg'$  dose of oxycodone -she does not want to try Qutenza as it did not seem to help -she would like to wean off her pain medication -she does not find the amitrptyline or mobic helpful -does not need refill at this time -she feels nauseous from the green pills and was told this may be due to sensitivity to the green dye used in the pill -her pain is severe and she asks whether she can take the 25moxycodone three times per day.  -years ago mobic helped her -she did find the tizanidine helpful -she is currently taking oxycodone '15mg'$  TID -she did not find Gabapentin helpful and would not like to try this again. She feels it made her urine smell foul -pain is worst at night and she sleeps poorly due to this -she finds amitriptyline somewhat helpful but does not want to increase the dose.  -worse with standing, walking, sitting. Heating pads do not help, hot shower does not help. Gabapentin caused foul smelling urine.  -not sleeping well at night due to the constant pain -pain radiates down the posterior aspect of both legs -Elavil has  helped the pain in the past.  -she wants to retry the oxycodone since that helped her in the past.  Tramadol is not effective at all.  -uses turmeric in her coffee every day.  -patient states she is not getting any benefit from oxycodone currently but this has been the only thing that has helped her in the past so she is not sure why it is not working for her anymore -she does not want to increase the Elavil further -she does not want to try Savella -she is currently taking 2 oxycodone '10mg'$  HCL daily and not getting any relief -she is severely limited in her function by pain  3) Lower extremity swelling: much improved.   4) HTN: BP has been well controlled at home.  120s/70s and she checks 3 times per day  5) Insomnia: She has been sleeping no more than 4 hours per night. -last night she had a hard time sleeping  6) Left hip pain: -pain is so severe that she was unable to go get her lumbar MIR and she had to reschedule this. -she would like to try meloxicam again -she understands to not take it with other NSAIDs -she would also like to increase amitriptyline and oxycodone dose -she would like to get XR of her hip  7) Nausea -she has been throwing up after taking oxycodone ever since she picked up her last refill -she is concerned that she received a bad batch and cannot tolerate taking the medicine,  but her pain is very severe -she would like to try percocet instead   Prior history:  XR shows humeral neck impaction fracture with hemarthrosis. I advised her to go to the ED. She tells me today that she received bilateral arm injections for pain relief in the ED. She cannot recall the name of the medication.   She says she was not seen by an orthopedist and was sent home without any pain medication given that she is under pain contract with me and should have enough of her Percocet left given that she received her last refill on 06/19/20 ([place] reviewed).   She was able to call the  orthopedist who did her right shoulder replacement. I advised her to try to get an appointment today as possible. She was able to do so at 10am so I advised her to go to this instead of come in for our appointment, and hence our phone appointment in place today.   Pain Inventory Average Pain 9 Pain Right Now 9 My pain is constant, sharp, burning and aching  In the last 24 hours, has pain interfered with the following? General activity 9 Relation with others 1 Enjoyment of life 9 What TIME of day is your pain at its worst?  all Sleep (in general) Poor  Pain is worse with: walking, bending, sitting, standing and some activites Pain improves with: medication Relief from Meds: 8   No family history on file. Social History   Socioeconomic History   Marital status: Divorced    Spouse name: Not on file   Number of children: Not on file   Years of education: Not on file   Highest education level: Not on file  Occupational History   Not on file  Tobacco Use   Smoking status: Never   Smokeless tobacco: Never  Vaping Use   Vaping Use: Never used  Substance and Sexual Activity   Alcohol use: No   Drug use: No   Sexual activity: Not on file  Other Topics Concern   Not on file  Social History Narrative   Not on file   Social Determinants of Health   Financial Resource Strain: Not on file  Food Insecurity: Not on file  Transportation Needs: Not on file  Physical Activity: Not on file  Stress: Not on file  Social Connections: Not on file   Past Surgical History:  Procedure Laterality Date   APPENDECTOMY     BREAST SURGERY Left    lymphnodes removed in the left arm X2   REVERSE SHOULDER ARTHROPLASTY Right 05/25/2017   REVERSE SHOULDER ARTHROPLASTY Right 05/25/2017   Procedure: REVERSE SHOULDER ARTHROPLASTY;  Surgeon: Tania Ade, MD;  Location: [facility];  Service: Orthopedics;  Laterality: Right;  RIGHT REVERSE TOTAL SHOULDER ARTHROPLASTY   Past Medical History:   Diagnosis Date   Arthritis    right shoulder   Complication of anesthesia    hard to wake up   History of kidney stones    Hypertension    Hypothyroidism    Pneumonia    history   RSD upper limb    left    Skin cancer    chest   There were no vitals taken for this visit.  Opioid Risk Score:   Fall Risk Score:  `1  Depression screen PHQ 2/9     09/26/2022    1:55 PM 07/21/2022    1:10 PM 06/28/2022    2:29 PM 05/03/2022    9:25 AM 11/18/2020  2:42 PM 05/20/2020    2:33 PM  Depression screen PHQ 2/9  Decreased Interest 0 0 1 0 0 0  Down, Depressed, Hopeless 0 0 1 0 0 0  PHQ - 2 Score 0 0 2 0 0 0    Review of Systems  Constitutional: Negative.   HENT: Negative.    Eyes: Negative.   Respiratory: Negative.    Cardiovascular:  Positive for leg swelling.  Gastrointestinal: Negative.   Endocrine: Negative.   Genitourinary:  Positive for urgency.  Musculoskeletal:  Positive for back pain and gait problem.  Skin: Negative.   Allergic/Immunologic: Negative.   Neurological:  Positive for numbness.       Tingling  Hematological: Negative.   Psychiatric/Behavioral:         Anxiety, Depression  All other systems reviewed and are negative.      Objective:   Physical Exam Gen: no distress, normal appearing, BMI 25.92 HEENT: oral mucosa pink and moist, NCAT Cardio: Reg rate Chest: normal effort, normal rate of breathing Abd: soft, non-distended Ext: no edema Psych: pleasant, normal affect Skin: intact Neuro: Loss of train of thought during coversation    Assessment & Plan:  Marie Burns is a 77 year old woman who presents for f/u of left humeral fracture following a fall, as well as spinal stenosis.   1) History of left humeral fracture: healed  2) Lumbar anterolisthesis: Lumbar MRI 07/2019:  IMPRESSION: 1. Transitional lumbosacral anatomy with partial sacralization of the L5 segment. 2. No significant interval progression of multilevel lumbar spondylosis,  most pronounced at the L3-4 level where there is severe stenosis of the bilateral subarticular recesses and severe canal stenosis. 3. Grade 1 anterolisthesis L3 on L4 and L4 on L5.  -failed Qutenza, will not repeat  -continue oxycodone '15mg'$  to TID, wrote on script not to use green dye as she seems to be sensitive to this.  -discussed that she has called local pharmacies and has been having a hard time finding pharamacies that make oxycodone without the green dye  -prescribed amitriptyline '10mg'$  HS  -Continue HEP -Continue hot water showers.  -urine screen today -continue Elavil '10mg'$  HS -UDS reviewed and stable -Provided with a pain relief journal and discussed that it contains foods and lifestyle tips to naturally help to improve pain. Discussed that these lifestyle strategies are also very good for health unlike some medications which can have negative side effects. Discussed that the act of keeping a journal can be therapeutic and helpful to realize patterns what helps to trigger and alleviate pain.   -Given neuropathic pain and side effects with nerve agents, will start Nucynta daily and decrease the Percocet to 4 tabs daily. May releast on 1/17.   -discussed that she has had success with steroid dose pack in the past -d/c gabapentin given lack of efficacy -will not retry cymbalta since this gave her headache -requested that she bring in her oxycodone for [place] to destroy -discussed gabapentin but she had a negative response with this medication in the past, she feels that it caused her urine to smell foul -discussed increasing Elavil or starting Savella but she prefers not to try given that amitriptyline makes her feel too groggy during the day. Discussed decreasing amitriptyline dose but she defers as does not want her sleep to worsen -Routine drug screens have had expected metabolites.  -Continue turmeric daily with black pepper.  -Continue daily cinnamon.  -Discussed ESI as an option  for her pain, she will let me know if  she wants to do this.  -Encouraged hot peppers.   -Discussed following foods that may reduce pain: 1) Ginger 2) Blueberries 3) Salmon 4) Pumpkin seeds 5) dark chocolate 6) turmeric 7) tart cherries 8) virgin olive oil 9) chilli peppers 10) mint  Link to further information on diet for chronic pain: http://www.randall.[email or web address]   Turmeric to reduce inflammation--can be used in cooking or taken as a supplement.  Benefits of turmeric:  -Highly anti-inflammatory  -Increases antioxidants  -Improves memory, attention, brain disease  -Lowers risk of heart disease  -May help prevent cancer  -Decreases pain  -Alleviates depression  -Delays aging and decreases risk of chronic disease  -Consume with black pepper to increase absorption    Turmeric Milk Recipe:  1 cup milk  1 tsp turmeric  1 tsp cinnamon  1 tsp grated ginger (optional)  Black pepper (boosts the anti-inflammatory properties of turmeric).  1 tsp honey   Insomnia: -continue Amitriptyline '10mg'$  HS  Left hip pain: -increase amitriptyline to '25mg'$  -prescribed percocet '10mg'$  TID PRN -left hip XR ordered and shows mild degeneration Meloxicam '15mg'$  daily prn ordered, advised to not take other NSAIDs with this due to bleeding risk -discussed PT which she would like to consider in the future  Herpes cold sore -topical acyclovir ordered  Insomnia: -Try to go outside near sunrise -Get exercise during the day.  -Turn off all devices an hour before bedtime.  -Teas that can benefit: chamomile, valerian root, Brahmi (Bacopa) -Can consider over the counter melatonin, magnesium, and/or L-theanine. Melatonin is an anti-oxidant with multiple health benefits. Magnesium is involved in greater than 300 enzymatic reactions in the body and most of [place] are deficient as our soil is often depleted. There are 7 different  types of magnesium- Bioptemizer's is a supplement with all 7 types, and each has unique benefits. Magnesium can also help with constipation and anxiety.  -Pistachios naturally increase the production of melatonin -[employer] bamboo bed sheets are free from toxic chemicals.  -Tart cherry juice or a tart cherry supplement can improve sleep and soreness post-workout  10 minutes spent in discussion of her pain, continuing oxycodone '15mg'$  TID PRN for pain, avoiding the green pills since she may be allergic to the green dye, discussed her difficulty in getting in touch with her family

## 2022-11-24 NOTE — Telephone Encounter (Signed)
Can you please send the oxycodone to [facility] on [address], [facility] did not have it in stock.

## 2022-11-24 NOTE — Telephone Encounter (Signed)
Spoke w/ pharmacist Shanon Brow he advised, they do not have medication in stock, called member and left VM to advise to find a pharmacy that has oxycodone 10/325 in stock and then we can send to that pharmacy

## 2022-11-25 ENCOUNTER — Encounter: Payer: Medicare HMO | Admitting: Physical Medicine and Rehabilitation

## 2022-11-25 ENCOUNTER — Other Ambulatory Visit: Payer: Self-pay | Admitting: Physical Medicine and Rehabilitation

## 2022-11-25 MED ORDER — OXYCODONE-ACETAMINOPHEN 10-325 MG PO TABS: 1.0000 | ORAL_TABLET | Freq: Three times a day (TID) | ORAL | 0 refills | Status: DC | PRN

## 2022-12-05 ENCOUNTER — Encounter: Payer: Self-pay | Admitting: Physical Medicine and Rehabilitation

## 2022-12-05 ENCOUNTER — Encounter (HOSPITAL_BASED_OUTPATIENT_CLINIC_OR_DEPARTMENT_OTHER): Payer: Medicare HMO | Admitting: Physical Medicine and Rehabilitation

## 2022-12-05 VITALS — Ht <= 58 in | Wt 115.0 lb

## 2022-12-05 DIAGNOSIS — M5416 Radiculopathy, lumbar region: Secondary | ICD-10-CM | POA: Diagnosis not present

## 2022-12-05 DIAGNOSIS — M48062 Spinal stenosis, lumbar region with neurogenic claudication: Secondary | ICD-10-CM | POA: Diagnosis not present

## 2022-12-05 DIAGNOSIS — Z818 Family history of other mental and behavioral disorders: Secondary | ICD-10-CM | POA: Diagnosis not present

## 2022-12-05 DIAGNOSIS — Z638 Other specified problems related to primary support group: Secondary | ICD-10-CM | POA: Diagnosis not present

## 2022-12-05 DIAGNOSIS — R4189 Other symptoms and signs involving cognitive functions and awareness: Secondary | ICD-10-CM | POA: Diagnosis not present

## 2022-12-05 DIAGNOSIS — G4701 Insomnia due to medical condition: Secondary | ICD-10-CM

## 2022-12-05 NOTE — Progress Notes (Signed)
Subjective:    Patient ID: Marie Burns, female    DOB: 13-Jan-1946, 77 y.o.   MRN: [id number]    Marie Burns is a 77 year old woman who presents for f/u of chronic neurogenic claudication  1) Left humeral fracture: Healed.    2) Neuropathic pain in her left leg> right: She has had headache with Cymbalta. Discussed Nucynta as an option.  -she needs to schedule transport a few days prior to appointments -has had significant improvement with the '15mg'$  dose of oxycodone -she does not want to try Qutenza as it did not seem to help -she would like to wean off her pain medication -she does not find the amitrptyline or mobic helpful -does not need refill at this time -she feels nauseous from the green pills and was told this may be due to sensitivity to the green dye used in the pill -pain has been severe in both lower extremities -oxycodone seems to help but Percocet doe not -amitriptyline is helping -her pain is severe and she asks whether she can take the 78moxycodone three times per day.  -years ago mobic helped her -she did find the tizanidine helpful -she is currently taking oxycodone '15mg'$  TID -she did not find Gabapentin helpful and would not like to try this again. She feels it made her urine smell foul -pain is worst at night and she sleeps poorly due to this -she finds amitriptyline somewhat helpful but does not want to increase the dose.  -worse with standing, walking, sitting. Heating pads do not help, hot shower does not help. Gabapentin caused foul smelling urine.  -not sleeping well at night due to the constant pain -pain radiates down the posterior aspect of both legs -Elavil has helped the pain in the past.  -she wants to retry the oxycodone since that helped her in the past.  Tramadol is not effective at all.  -uses turmeric in her coffee every day.  -patient states she is not getting any benefit from oxycodone currently but this has been the only thing that has  helped her in the past so she is not sure why it is not working for her anymore -she does not want to increase the Elavil further -she does not want to try Savella -she is currently taking 2 oxycodone '10mg'$  HCL daily and not getting any relief -she is severely limited in her function by pain  3) Lower extremity swelling: much improved.   4) HTN: BP has been well controlled at home.  120s/70s and she checks 3 times per day  5) Insomnia: She has been sleeping no more than 4 hours per night. -last night she had a hard time sleeping  6) Left hip pain: -pain is so severe that she was unable to go get her lumbar MIR and she had to reschedule this. -she would like to try meloxicam again -she understands to not take it with other NSAIDs -she would also like to increase amitriptyline and oxycodone dose -she would like to get XR of her hip  7) Nausea -she has been throwing up after taking oxycodone ever since she picked up her last refill -she is concerned that she received a bad batch and cannot tolerate taking the medicine, but her pain is very severe -she would like to try percocet instead  8) Family discord -patient is frustrated that family feels her cognition is declining and that she needs help at home  982 Family history of dementia -she has 2 family members with  history of dementia and would like to be tested  Prior history:  XR shows humeral neck impaction fracture with hemarthrosis. I advised her to go to the ED. She tells me today that she received bilateral arm injections for pain relief in the ED. She cannot recall the name of the medication.   She says she was not seen by an orthopedist and was sent home without any pain medication given that she is under pain contract with me and should have enough of her Percocet left given that she received her last refill on 06/19/20 ([place] reviewed).   She was able to call the orthopedist who did her right shoulder replacement. I advised her  to try to get an appointment today as possible. She was able to do so at 10am so I advised her to go to this instead of come in for our appointment, and hence our phone appointment in place today.   Pain Inventory Average Pain  Pain Right Now 9 My pain is constant, sharp, burning and aching  In the last 24 hours, has pain interfered with the following? General activity 9 Relation with others 1 Enjoyment of life 9 What TIME of day is your pain at its worst?  all Sleep (in general) Poor  Pain is worse with: walking, bending, sitting, standing and some activites Pain improves with: medication Relief from Meds: 8   History reviewed. No pertinent family history. Social History   Socioeconomic History   Marital status: Divorced    Spouse name: Not on file   Number of children: Not on file   Years of education: Not on file   Highest education level: Not on file  Occupational History   Not on file  Tobacco Use   Smoking status: Never   Smokeless tobacco: Never  Vaping Use   Vaping Use: Never used  Substance and Sexual Activity   Alcohol use: No   Drug use: No   Sexual activity: Not on file  Other Topics Concern   Not on file  Social History Narrative   Not on file   Social Determinants of Health   Financial Resource Strain: Not on file  Food Insecurity: Not on file  Transportation Needs: Not on file  Physical Activity: Not on file  Stress: Not on file  Social Connections: Not on file   Past Surgical History:  Procedure Laterality Date   APPENDECTOMY     BREAST SURGERY Left    lymphnodes removed in the left arm X2   REVERSE SHOULDER ARTHROPLASTY Right 05/25/2017   REVERSE SHOULDER ARTHROPLASTY Right 05/25/2017   Procedure: REVERSE SHOULDER ARTHROPLASTY;  Surgeon: Tania Ade, MD;  Location: [facility];  Service: Orthopedics;  Laterality: Right;  RIGHT REVERSE TOTAL SHOULDER ARTHROPLASTY   Past Medical History:  Diagnosis Date   Arthritis    right shoulder    Complication of anesthesia    hard to wake up   History of kidney stones    Hypertension    Hypothyroidism    Pneumonia    history   RSD upper limb    left    Skin cancer    chest   Ht '4\' 10"'$  (1.473 m)   BMI 25.50 kg/m   Opioid Risk Score:   Fall Risk Score:  `1  Depression screen PHQ 2/9     09/26/2022    1:55 PM 07/21/2022    1:10 PM 06/28/2022    2:29 PM 05/03/2022    9:25 AM 11/18/2020  2:42 PM 05/20/2020    2:33 PM  Depression screen PHQ 2/9  Decreased Interest 0 0 1 0 0 0  Down, Depressed, Hopeless 0 0 1 0 0 0  PHQ - 2 Score 0 0 2 0 0 0    Review of Systems  Constitutional: Negative.   HENT: Negative.    Eyes: Negative.   Respiratory: Negative.    Cardiovascular:  Positive for leg swelling.  Gastrointestinal: Negative.   Endocrine: Negative.   Genitourinary:  Positive for urgency.  Musculoskeletal:  Positive for back pain and gait problem.  Skin: Negative.   Allergic/Immunologic: Negative.   Neurological:  Positive for numbness.       Tingling  Hematological: Negative.   Psychiatric/Behavioral:         Anxiety, Depression  All other systems reviewed and are negative.      Objective:   Physical Exam Gen: no distress, normal appearing, BMI 24.04, weight is 115 lbs HEENT: oral mucosa pink and moist, NCAT Cardio: Reg rate Chest: normal effort, normal rate of breathing Abd: soft, non-distended Ext: no edema Psych: pleasant, normal affect Skin: intact Neuro: Loss of train of thought during conversation, antalgic    Assessment & Plan:  Marie Burns is a 77 year old woman who presents for f/u of left humeral fracture following a fall, as well as spinal stenosis.   1) History of left humeral fracture: healed  2) Lumbar anterolisthesis: Lumbar MRI 07/2019:  IMPRESSION: 1. Transitional lumbosacral anatomy with partial sacralization of the L5 segment. 2. No significant interval progression of multilevel lumbar spondylosis, most pronounced at the L3-4  level where there is severe stenosis of the bilateral subarticular recesses and severe canal stenosis. 3. Grade 1 anterolisthesis L3 on L4 and L4 on L5.  -failed Qutenza, will not repeat  -continue oxycodone '15mg'$  to TID, wrote on script not to use green dye as she seems to be sensitive to this.  -discussed that she has called local pharmacies and has been having a hard time finding pharamacies that make oxycodone without the green dye, asked Asencion Partridge to send her warning letter today for losing medication  -continue amitriptyline '10mg'$  HS  -Continue HEP -Continue hot water showers.  -urine screen today -UDS reviewed and stable -Provided with a pain relief journal and discussed that it contains foods and lifestyle tips to naturally help to improve pain. Discussed that these lifestyle strategies are also very good for health unlike some medications which can have negative side effects. Discussed that the act of keeping a journal can be therapeutic and helpful to realize patterns what helps to trigger and alleviate pain.   -Given neuropathic pain and side effects with nerve agents, will start Nucynta daily and decrease the Percocet to 4 tabs daily. May releast on 1/17.   -discussed that she has had success with steroid dose pack in the past -d/c gabapentin given lack of efficacy -will not retry cymbalta since this gave her headache -requested that she bring in her oxycodone for [place] to destroy -discussed gabapentin but she had a negative response with this medication in the past, she feels that it caused her urine to smell foul -discussed increasing Elavil or starting Savella but she prefers not to try given that amitriptyline makes her feel too groggy during the day. Discussed decreasing amitriptyline dose but she defers as does not want her sleep to worsen -Routine drug screens have had expected metabolites.  -Continue turmeric daily with black pepper.  -Continue daily cinnamon.  -Discussed ESI  as an option for her pain, she will let me know if she wants to do this.  -Encouraged hot peppers.   -Discussed following foods that may reduce pain: 1) Ginger 2) Blueberries 3) Salmon 4) Pumpkin seeds 5) dark chocolate 6) turmeric 7) tart cherries 8) virgin olive oil 9) chilli peppers 10) mint  Link to further information on diet for chronic pain: [email or web address].[email or web address]   Turmeric to reduce inflammation--can be used in cooking or taken as a supplement.  Benefits of turmeric:  -Highly anti-inflammatory  -Increases antioxidants  -Improves memory, attention, brain disease  -Lowers risk of heart disease  -May help prevent cancer  -Decreases pain  -Alleviates depression  -Delays aging and decreases risk of chronic disease  -Consume with black pepper to increase absorption    Turmeric Milk Recipe:  1 cup milk  1 tsp turmeric  1 tsp cinnamon  1 tsp grated ginger (optional)  Black pepper (boosts the anti-inflammatory properties of turmeric).  1 tsp honey  Left hip pain: -continue oxycodone -left hip XR ordered and shows mild degeneration Meloxicam '15mg'$  daily prn ordered, advised to not take other NSAIDs with this due to bleeding risk -discussed PT which she would like to consider in the future  Herpes cold sore -topical acyclovir ordered  Insomnia: -Try to go outside near sunrise -Get exercise during the day.  -Turn off all devices an hour before bedtime.  -Teas that can benefit: chamomile, valerian root, Brahmi (Bacopa) -Can consider over the counter melatonin, magnesium, and/or L-theanine. Melatonin is an anti-oxidant with multiple health benefits. Magnesium is involved in greater than 300 enzymatic reactions in the body and most of [place] are deficient as our soil is often depleted. There are 7 different types of magnesium- Bioptemizer's is a supplement with all 7 types, and each has  unique benefits. Magnesium can also help with constipation and anxiety.  -Pistachios naturally increase the production of melatonin -[employer] bamboo bed sheets are free from toxic chemicals.  -Tart cherry juice or a tart cherry supplement can improve sleep and soreness post-workout    Family history of dementia: -referred to neurology to assess for dementia -discussed that her pain medications can negatively affect cognition  Family discord: -discussed her and her family's concerns

## 2022-12-05 NOTE — Patient Instructions (Signed)

## 2022-12-06 ENCOUNTER — Encounter: Payer: Self-pay | Admitting: Physical Medicine and Rehabilitation

## 2022-12-08 ENCOUNTER — Telehealth: Payer: Self-pay | Admitting: Physical Medicine and Rehabilitation

## 2022-12-08 ENCOUNTER — Encounter: Payer: Self-pay | Admitting: Physical Medicine and Rehabilitation

## 2022-12-08 NOTE — Telephone Encounter (Signed)
Patient would like to Dr. Ranell Patrick to call her.  She didn't give a reason for call--she said nothing was wrong.

## 2022-12-12 ENCOUNTER — Encounter (HOSPITAL_BASED_OUTPATIENT_CLINIC_OR_DEPARTMENT_OTHER): Payer: Medicare HMO | Admitting: Physical Medicine and Rehabilitation

## 2022-12-12 DIAGNOSIS — M5416 Radiculopathy, lumbar region: Secondary | ICD-10-CM | POA: Diagnosis not present

## 2022-12-12 MED ORDER — OXYCODONE HCL 15 MG PO TABS
15.0000 mg | ORAL_TABLET | Freq: Three times a day (TID) | ORAL | 0 refills | Status: DC | PRN
Start: 2022-12-12 — End: 2022-12-19

## 2022-12-12 NOTE — Progress Notes (Signed)
Subjective:    Patient ID: Marie Burns, female    DOB: February 04, 1946, 77 y.o.   MRN: [id number]  An audio/video tele-health visit is felt to be the most appropriate encounter for this patient at this time. This is a follow up tele-visit via phone. The patient is at home. MD is at office. Prior to scheduling this appointment, our staff discussed the limitations of evaluation and management by telemedicine and the availability of in-person appointments. The patient expressed understanding and agreed to proceed.   Marie Burns is a 77 year old woman who presents for f/uof chronic neurogenic claudication  1) Left humeral fracture: Healed.    2) Neuropathic pain in her left leg> right: She has had headache with Cymbalta. Discussed Nucynta as an option.  -she found her lost medication -she will bring the bottle to the follow-up -she did not get a full prescription- she only received 80 pills as the pharamacist did not have the full 90 pills. -she was flipping her mattress and found the bottle under the mattress.  -she needs to schedule transport a few days prior to appointments -has had significant improvement with the '15mg'$  dose of oxycodone -she does not want to try Qutenza as it did not seem to help -she would like to wean off her pain medication -she does not find the amitrptyline or mobic helpful -does not need refill at this time -she feels nauseous from the green pills and was told this may be due to sensitivity to the green dye used in the pill -pain has been severe in both lower extremities -oxycodone seems to help but Percocet doe not -amitriptyline is helping -her pain is severe and she asks whether she can take the 106moxycodone three times per day.  -years ago mobic helped her -she did find the tizanidine helpful -she is currently taking oxycodone '15mg'$  TID -she did not find Gabapentin helpful and would not like to try this again. She feels it made her urine smell foul -pain  is worst at night and she sleeps poorly due to this -she finds amitriptyline somewhat helpful but does not want to increase the dose.  -worse with standing, walking, sitting. Heating pads do not help, hot shower does not help. Gabapentin caused foul smelling urine.  -not sleeping well at night due to the constant pain -pain radiates down the posterior aspect of both legs -Elavil has helped the pain in the past.  -she wants to retry the oxycodone since that helped her in the past.  Tramadol is not effective at all.  -uses turmeric in her coffee every day.  -patient states she is not getting any benefit from oxycodone currently but this has been the only thing that has helped her in the past so she is not sure why it is not working for her anymore -she does not want to increase the Elavil further -she does not want to try Savella -she is currently taking 2 oxycodone '10mg'$  HCL daily and not getting any relief -she is severely limited in her function by pain  3) Lower extremity swelling: much improved.   4) HTN: BP has been well controlled at home.  120s/70s and she checks 3 times per day  5) Insomnia: She has been sleeping no more than 4 hours per night. -last night she had a hard time sleeping  6) Left hip pain: -pain is so severe that she was unable to go get her lumbar MIR and she had to reschedule this. -she would like  to try meloxicam again -she understands to not take it with other NSAIDs -she would also like to increase amitriptyline and oxycodone dose -she would like to get XR of her hip  7) Nausea -she has been throwing up after taking oxycodone ever since she picked up her last refill -she is concerned that she received a bad batch and cannot tolerate taking the medicine, but her pain is very severe -she would like to try percocet instead  8) Family discord -patient is frustrated that family feels her cognition is declining and that she needs help at home  28) Family  history of dementia -she has 2 family members with history of dementia and would like to be tested  Prior history:  XR shows humeral neck impaction fracture with hemarthrosis. I advised her to go to the ED. She tells me today that she received bilateral arm injections for pain relief in the ED. She cannot recall the name of the medication.   She says she was not seen by an orthopedist and was sent home without any pain medication given that she is under pain contract with me and should have enough of her Percocet left given that she received her last refill on 06/19/20 ([place] reviewed).   She was able to call the orthopedist who did her right shoulder replacement. I advised her to try to get an appointment today as possible. She was able to do so at 10am so I advised her to go to this instead of come in for our appointment, and hence our phone appointment in place today.   Pain Inventory Average Pain  Pain Right Now 9 My pain is constant, sharp, burning and aching  In the last 24 hours, has pain interfered with the following? General activity 9 Relation with others 1 Enjoyment of life 9 What TIME of day is your pain at its worst?  all Sleep (in general) Poor  Pain is worse with: walking, bending, sitting, standing and some activites Pain improves with: medication Relief from Meds: 8   No family history on file. Social History   Socioeconomic History   Marital status: Divorced    Spouse name: Not on file   Number of children: Not on file   Years of education: Not on file   Highest education level: Not on file  Occupational History   Not on file  Tobacco Use   Smoking status: Never   Smokeless tobacco: Never  Vaping Use   Vaping Use: Never used  Substance and Sexual Activity   Alcohol use: No   Drug use: No   Sexual activity: Not on file  Other Topics Concern   Not on file  Social History Narrative   Not on file   Social Determinants of Health   Financial Resource  Strain: Not on file  Food Insecurity: Not on file  Transportation Needs: Not on file  Physical Activity: Not on file  Stress: Not on file  Social Connections: Not on file   Past Surgical History:  Procedure Laterality Date   APPENDECTOMY     BREAST SURGERY Left    lymphnodes removed in the left arm X2   REVERSE SHOULDER ARTHROPLASTY Right 05/25/2017   REVERSE SHOULDER ARTHROPLASTY Right 05/25/2017   Procedure: REVERSE SHOULDER ARTHROPLASTY;  Surgeon: Tania Ade, MD;  Location: [facility];  Service: Orthopedics;  Laterality: Right;  RIGHT REVERSE TOTAL SHOULDER ARTHROPLASTY   Past Medical History:  Diagnosis Date   Arthritis    right shoulder  Complication of anesthesia    hard to wake up   History of kidney stones    Hypertension    Hypothyroidism    Pneumonia    history   RSD upper limb    left    Skin cancer    chest   There were no vitals taken for this visit.  Opioid Risk Score:   Fall Risk Score:  `1  Depression screen PHQ 2/9     09/26/2022    1:55 PM 07/21/2022    1:10 PM 06/28/2022    2:29 PM 05/03/2022    9:25 AM 11/18/2020    2:42 PM 05/20/2020    2:33 PM  Depression screen PHQ 2/9  Decreased Interest 0 0 1 0 0 0  Down, Depressed, Hopeless 0 0 1 0 0 0  PHQ - 2 Score 0 0 2 0 0 0    Review of Systems  Constitutional: Negative.   HENT: Negative.    Eyes: Negative.   Respiratory: Negative.    Cardiovascular:  Positive for leg swelling.  Gastrointestinal: Negative.   Endocrine: Negative.   Genitourinary:  Positive for urgency.  Musculoskeletal:  Positive for back pain and gait problem.  Skin: Negative.   Allergic/Immunologic: Negative.   Neurological:  Positive for numbness.       Tingling  Hematological: Negative.   Psychiatric/Behavioral:         Anxiety, Depression  All other systems reviewed and are negative.      Objective:   Physical Exam Gen: no distress, normal appearing, BMI 24.04, weight is 115 lbs HEENT: oral mucosa pink and moist,  NCAT Cardio: Reg rate Chest: normal effort, normal rate of breathing Abd: soft, non-distended Ext: no edema Psych: pleasant, normal affect Skin: intact Neuro: Loss of train of thought during conversation, antalgic    Assessment & Plan:  Mrs. Leandro is a 77 year old woman who presents for f/u of left humeral fracture following a fall, as well as spinal stenosis.   1) History of left humeral fracture: healed  2) Lumbar anterolisthesis: Lumbar MRI 07/2019:  IMPRESSION: 1. Transitional lumbosacral anatomy with partial sacralization of the L5 segment. 2. No significant interval progression of multilevel lumbar spondylosis, most pronounced at the L3-4 level where there is severe stenosis of the bilateral subarticular recesses and severe canal stenosis. 3. Grade 1 anterolisthesis L3 on L4 and L4 on L5.  -failed Qutenza, will not repeat  -continue oxycodone '15mg'$  to TID, wrote on script not to use green dye as she seems to be sensitive to this.  -discussed that she has called local pharmacies and has been having a hard time finding pharamacies that make oxycodone without the green dye -discussed that oxycodone was not fully filled when she last received it due to lack of supply by her pharmacy -send script for the additional 10 pills -continue amitriptyline '10mg'$  HS  -Continue HEP -Continue hot water showers.  -urine screen today -UDS reviewed and stable -Provided with a pain relief journal and discussed that it contains foods and lifestyle tips to naturally help to improve pain. Discussed that these lifestyle strategies are also very good for health unlike some medications which can have negative side effects. Discussed that the act of keeping a journal can be therapeutic and helpful to realize patterns what helps to trigger and alleviate pain.   -Given neuropathic pain and side effects with nerve agents, will start Nucynta daily and decrease the Percocet to 4 tabs daily. May releast on  1/17.   -  discussed that she has had success with steroid dose pack in the past -d/c gabapentin given lack of efficacy -will not retry cymbalta since this gave her headache -requested that she bring in her oxycodone for [place] to destroy -discussed gabapentin but she had a negative response with this medication in the past, she feels that it caused her urine to smell foul -discussed increasing Elavil or starting Savella but she prefers not to try given that amitriptyline makes her feel too groggy during the day. Discussed decreasing amitriptyline dose but she defers as does not want her sleep to worsen -Routine drug screens have had expected metabolites.  -Continue turmeric daily with black pepper.  -Continue daily cinnamon.  -Discussed ESI as an option for her pain, she will let me know if she wants to do this.  -Encouraged hot peppers.   -Discussed following foods that may reduce pain: 1) Ginger 2) Blueberries 3) Salmon 4) Pumpkin seeds 5) dark chocolate 6) turmeric 7) tart cherries 8) virgin olive oil 9) chilli peppers 10) mint  Link to further information on diet for chronic pain: http://[email or web address].[email or web address]   Turmeric to reduce inflammation--can be used in cooking or taken as a supplement.  Benefits of turmeric:  -Highly anti-inflammatory  -Increases antioxidants  -Improves memory, attention, brain disease  -Lowers risk of heart disease  -May help prevent cancer  -Decreases pain  -Alleviates depression  -Delays aging and decreases risk of chronic disease  -Consume with black pepper to increase absorption    Turmeric Milk Recipe:  1 cup milk  1 tsp turmeric  1 tsp cinnamon  1 tsp grated ginger (optional)  Black pepper (boosts the anti-inflammatory properties of turmeric).  1 tsp honey  Left hip pain: -continue oxycodone -left hip XR ordered and shows mild degeneration Meloxicam '15mg'$   daily prn ordered, advised to not take other NSAIDs with this due to bleeding risk -discussed PT which she would like to consider in the future  Herpes cold sore -topical acyclovir ordered  Insomnia: -Try to go outside near sunrise -Get exercise during the day.  -Turn off all devices an hour before bedtime.  -Teas that can benefit: chamomile, valerian root, Brahmi (Bacopa) -Can consider over the counter melatonin, magnesium, and/or L-theanine. Melatonin is an anti-oxidant with multiple health benefits. Magnesium is involved in greater than 300 enzymatic reactions in the body and most of [place] are deficient as our soil is often depleted. There are 7 different types of magnesium- Bioptemizer's is a supplement with all 7 types, and each has unique benefits. Magnesium can also help with constipation and anxiety.  -Pistachios naturally increase the production of melatonin -[employer] bamboo bed sheets are free from toxic chemicals.  -Tart cherry juice or a tart cherry supplement can improve sleep and soreness post-workout    Family history of dementia: -referred to neurology to assess for dementia -discussed that her pain medications can negatively affect cognition  Family discord: -discussed her and her family's concerns  6 minutes spent in discussion that she found her medication, that she only received 80 of the total 90 she was supposed to receive on her last fill date

## 2022-12-14 ENCOUNTER — Other Ambulatory Visit: Payer: Self-pay | Admitting: Physical Medicine and Rehabilitation

## 2022-12-14 MED ORDER — ACYCLOVIR 400 MG PO TABS: 800.0000 mg | ORAL_TABLET | Freq: Two times a day (BID) | ORAL | 3 refills | Status: DC

## 2022-12-19 ENCOUNTER — Encounter: Payer: Medicare HMO | Attending: Physical Medicine and Rehabilitation | Admitting: Physical Medicine and Rehabilitation

## 2022-12-19 ENCOUNTER — Telehealth: Payer: Self-pay | Admitting: Physical Medicine and Rehabilitation

## 2022-12-19 DIAGNOSIS — R634 Abnormal weight loss: Secondary | ICD-10-CM | POA: Insufficient documentation

## 2022-12-19 DIAGNOSIS — M5416 Radiculopathy, lumbar region: Secondary | ICD-10-CM | POA: Insufficient documentation

## 2022-12-19 DIAGNOSIS — G4701 Insomnia due to medical condition: Secondary | ICD-10-CM | POA: Insufficient documentation

## 2022-12-19 DIAGNOSIS — R4189 Other symptoms and signs involving cognitive functions and awareness: Secondary | ICD-10-CM | POA: Insufficient documentation

## 2022-12-19 MED ORDER — OXYCODONE HCL 15 MG PO TABS: 15.0000 mg | ORAL_TABLET | Freq: Three times a day (TID) | ORAL | 0 refills | Status: DC | PRN

## 2022-12-19 NOTE — Addendum Note (Signed)
Addended by: Izora Ribas on: 12/19/2022 02:13 PM   Modules accepted: Level of Service

## 2022-12-19 NOTE — Telephone Encounter (Signed)
Patient called in requesting assistance with her prescription , patient states it was sent to [facility] but she is unable to get her medication because medication needs to be resubmitted to her insurance.

## 2022-12-19 NOTE — Addendum Note (Signed)
Addended by: Izora Ribas on: 12/19/2022 01:03 PM   Modules accepted: Orders

## 2022-12-19 NOTE — Progress Notes (Addendum)
Subjective:    Patient ID: Marie Burns, female    DOB: May 26, 1946, 77 y.o.   MRN: [id number]  An audio/video tele-health visit is felt to be the most appropriate encounter for this patient at this time. This is a follow up tele-visit via phone. The patient is at home. MD is at office. Prior to scheduling this appointment, our staff discussed the limitations of evaluation and management by telemedicine and the availability of in-person appointments. The patient expressed understanding and agreed to proceed.   Marie Burns is a 77 year old woman who presents for f/u of chronic neurogenic claudication  1) Left humeral fracture: Healed.    2) Neuropathic pain in her left leg> right: She has had headache with Cymbalta. Discussed Nucynta as an option.  -she found her lost medication -she needs refill today -she last filled medication on 1/5 -last week she had just picked up 10 pills that were short on earlier script -she has been having severe flare of her pain -she will bring the bottle to the follow-up -she did not get a full prescription- she only received 80 pills as the pharamacist did not have the full 90 pills. -she was flipping her mattress and found the bottle under the mattress.  -she needs to schedule transport a few days prior to appointments -has had significant improvement with the '15mg'$  dose of oxycodone -she does not want to try Qutenza as it did not seem to help -she would like to wean off her pain medication -she does not find the amitrptyline or mobic helpful -does not need refill at this time -she feels nauseous from the green pills and was told this may be due to sensitivity to the green dye used in the pill -pain has been severe in both lower extremities -oxycodone seems to help but Percocet doe not -amitriptyline is helping -her pain is severe and she asks whether she can take the 68moxycodone three times per day.  -years ago mobic helped her -she did find the  tizanidine helpful -she is currently taking oxycodone '15mg'$  TID -she did not find Gabapentin helpful and would not like to try this again. She feels it made her urine smell foul -pain is worst at night and she sleeps poorly due to this -she finds amitriptyline somewhat helpful but does not want to increase the dose.  -worse with standing, walking, sitting. Heating pads do not help, hot shower does not help. Gabapentin caused foul smelling urine.  -not sleeping well at night due to the constant pain -pain radiates down the posterior aspect of both legs -Elavil has helped the pain in the past.  -she wants to retry the oxycodone since that helped her in the past.  Tramadol is not effective at all.  -uses turmeric in her coffee every day.  -patient states she is not getting any benefit from oxycodone currently but this has been the only thing that has helped her in the past so she is not sure why it is not working for her anymore -she does not want to increase the Elavil further -she does not want to try Savella -she is currently taking 2 oxycodone '10mg'$  HCL daily and not getting any relief -she is severely limited in her function by pain  3) Lower extremity swelling: much improved.   4) HTN: BP has been well controlled at home.  120s/70s and she checks 3 times per day  5) Insomnia: She has been sleeping no more than 4 hours per night. -last  night she had a hard time sleeping  6) Left hip pain: -pain is so severe that she was unable to go get her lumbar MIR and she had to reschedule this. -she would like to try meloxicam again -she understands to not take it with other NSAIDs -she would also like to increase amitriptyline and oxycodone dose -she would like to get XR of her hip  7) Nausea -she has been throwing up after taking oxycodone ever since she picked up her last refill -she is concerned that she received a bad batch and cannot tolerate taking the medicine, but her pain is very  severe -she would like to try percocet instead  8) Family discord -patient is frustrated that family feels her cognition is declining and that she needs help at home  75) Family history of dementia -she has 2 family members with history of dementia and would like to be tested  Prior history:  XR shows humeral neck impaction fracture with hemarthrosis. I advised her to go to the ED. She tells me today that she received bilateral arm injections for pain relief in the ED. She cannot recall the name of the medication.   She says she was not seen by an orthopedist and was sent home without any pain medication given that she is under pain contract with me and should have enough of her Percocet left given that she received her last refill on 06/19/20 ([place] reviewed).   She was able to call the orthopedist who did her right shoulder replacement. I advised her to try to get an appointment today as possible. She was able to do so at 10am so I advised her to go to this instead of come in for our appointment, and hence our phone appointment in place today.   Pain Inventory Average Pain  Pain Right Now 9 My pain is constant, sharp, burning and aching  In the last 24 hours, has pain interfered with the following? General activity 9 Relation with others 1 Enjoyment of life 9 What TIME of day is your pain at its worst?  all Sleep (in general) Poor  Pain is worse with: walking, bending, sitting, standing and some activites Pain improves with: medication Relief from Meds: 8   No family history on file. Social History   Socioeconomic History   Marital status: Divorced    Spouse name: Not on file   Number of children: Not on file   Years of education: Not on file   Highest education level: Not on file  Occupational History   Not on file  Tobacco Use   Smoking status: Never   Smokeless tobacco: Never  Vaping Use   Vaping Use: Never used  Substance and Sexual Activity   Alcohol use: No    Drug use: No   Sexual activity: Not on file  Other Topics Concern   Not on file  Social History Narrative   Not on file   Social Determinants of Health   Financial Resource Strain: Not on file  Food Insecurity: Not on file  Transportation Needs: Not on file  Physical Activity: Not on file  Stress: Not on file  Social Connections: Not on file   Past Surgical History:  Procedure Laterality Date   APPENDECTOMY     BREAST SURGERY Left    lymphnodes removed in the left arm X2   REVERSE SHOULDER ARTHROPLASTY Right 05/25/2017   REVERSE SHOULDER ARTHROPLASTY Right 05/25/2017   Procedure: REVERSE SHOULDER ARTHROPLASTY;  Surgeon: Tamera Punt,  Larkin Ina, MD;  Location: [facility];  Service: Orthopedics;  Laterality: Right;  RIGHT REVERSE TOTAL SHOULDER ARTHROPLASTY   Past Medical History:  Diagnosis Date   Arthritis    right shoulder   Complication of anesthesia    hard to wake up   History of kidney stones    Hypertension    Hypothyroidism    Pneumonia    history   RSD upper limb    left    Skin cancer    chest   There were no vitals taken for this visit.  Opioid Risk Score:   Fall Risk Score:  `1  Depression screen PHQ 2/9     09/26/2022    1:55 PM 07/21/2022    1:10 PM 06/28/2022    2:29 PM 05/03/2022    9:25 AM 11/18/2020    2:42 PM 05/20/2020    2:33 PM  Depression screen PHQ 2/9  Decreased Interest 0 0 1 0 0 0  Down, Depressed, Hopeless 0 0 1 0 0 0  PHQ - 2 Score 0 0 2 0 0 0    Review of Systems  Constitutional: Negative.   HENT: Negative.    Eyes: Negative.   Respiratory: Negative.    Cardiovascular:  Positive for leg swelling.  Gastrointestinal: Negative.   Endocrine: Negative.   Genitourinary:  Positive for urgency.  Musculoskeletal:  Positive for back pain and gait problem.  Skin: Negative.   Allergic/Immunologic: Negative.   Neurological:  Positive for numbness.       Tingling  Hematological: Negative.   Psychiatric/Behavioral:         Anxiety, Depression   All other systems reviewed and are negative.      Objective:   Physical Exam Gen: no distress, normal appearing, BMI 24.04, weight is 115 lbs HEENT: oral mucosa pink and moist, NCAT Cardio: Reg rate Chest: normal effort, normal rate of breathing Abd: soft, non-distended Ext: no edema Psych: pleasant, normal affect Skin: intact Neuro: Loss of train of thought during conversation, antalgic    Assessment & Plan:  Marie Burns is a 77 year old woman who presents for f/u of left humeral fracture following a fall, as well as spinal stenosis.   1) History of left humeral fracture: healed  2) Lumbar anterolisthesis: Lumbar MRI 07/2019:  IMPRESSION: 1. Transitional lumbosacral anatomy with partial sacralization of the L5 segment. 2. No significant interval progression of multilevel lumbar spondylosis, most pronounced at the L3-4 level where there is severe stenosis of the bilateral subarticular recesses and severe canal stenosis. 3. Grade 1 anterolisthesis L3 on L4 and L4 on L5.  -failed Qutenza, will not repeat  -refilled oxycodone '15mg'$  to TID, wrote on script not to use green dye as she seems to be sensitive to this.  -discussed that she has called local pharmacies and has been having a hard time finding pharamacies that make oxycodone without the green dye -discussed her current pain flare -continue amitriptyline '10mg'$  HS  -Continue HEP -Continue hot water showers.  -urine screen today -UDS reviewed and stable -Provided with a pain relief journal and discussed that it contains foods and lifestyle tips to naturally help to improve pain. Discussed that these lifestyle strategies are also very good for health unlike some medications which can have negative side effects. Discussed that the act of keeping a journal can be therapeutic and helpful to realize patterns what helps to trigger and alleviate pain.   -Given neuropathic pain and side effects with nerve agents, will start  Nucynta  daily and decrease the Percocet to 4 tabs daily. May releast on 1/17.   -discussed that she has had success with steroid dose pack in the past -d/c gabapentin given lack of efficacy -will not retry cymbalta since this gave her headache -requested that she bring in her oxycodone for [place] to destroy -discussed gabapentin but she had a negative response with this medication in the past, she feels that it caused her urine to smell foul -discussed increasing Elavil or starting Savella but she prefers not to try given that amitriptyline makes her feel too groggy during the day. Discussed decreasing amitriptyline dose but she defers as does not want her sleep to worsen -Routine drug screens have had expected metabolites.  -Continue turmeric daily with black pepper.  -Continue daily cinnamon.  -Discussed ESI as an option for her pain, she will let me know if she wants to do this.  -Encouraged hot peppers.   -Discussed following foods that may reduce pain: 1) Ginger 2) Blueberries 3) Salmon 4) Pumpkin seeds 5) dark chocolate 6) turmeric 7) tart cherries 8) virgin olive oil 9) chilli peppers 10) mint  Link to further information on diet for chronic pain: http://[email or web address].[email or web address]/   Turmeric to reduce inflammation--can be used in cooking or taken as a supplement.  Benefits of turmeric:  -Highly anti-inflammatory  -Increases antioxidants  -Improves memory, attention, brain disease  -Lowers risk of heart disease  -May help prevent cancer  -Decreases pain  -Alleviates depression  -Delays aging and decreases risk of chronic disease  -Consume with black pepper to increase absorption    Turmeric Milk Recipe:  1 cup milk  1 tsp turmeric  1 tsp cinnamon  1 tsp grated ginger (optional)  Black pepper (boosts the anti-inflammatory properties of turmeric).  1 tsp honey  Left hip pain: -continue  oxycodone -left hip XR ordered and shows mild degeneration Meloxicam '15mg'$  daily prn ordered, advised to not take other NSAIDs with this due to bleeding risk -discussed PT which she would like to consider in the future  Herpes cold sore -topical acyclovir ordered  Insomnia: -Try to go outside near sunrise -Get exercise during the day.  -Turn off all devices an hour before bedtime.  -Teas that can benefit: chamomile, valerian root, Brahmi (Bacopa) -Can consider over the counter melatonin, magnesium, and/or L-theanine. Melatonin is an anti-oxidant with multiple health benefits. Magnesium is involved in greater than 300 enzymatic reactions in the body and most of [place] are deficient as our soil is often depleted. There are 7 different types of magnesium- Bioptemizer's is a supplement with all 7 types, and each has unique benefits. Magnesium can also help with constipation and anxiety.  -Pistachios naturally increase the production of melatonin -[employer] bamboo bed sheets are free from toxic chemicals.  -Tart cherry juice or a tart cherry supplement can improve sleep and soreness post-workout    Family history of dementia: -referred to neurology to assess for dementia -discussed that her pain medications can negatively affect cognition  Family discord: -discussed her and her family's concerns  12 minutes spent in discussing that I would send her medication for her today, discussing her current flare of pain, that they are requiring her to pay $98 which she does not have, that she is shaking right now as she needs her medication

## 2022-12-20 ENCOUNTER — Telehealth: Payer: Self-pay

## 2022-12-20 NOTE — Telephone Encounter (Signed)
Prior Authorization for Oxycodone 15 MG approved 11/14/2022 until 11/14/2023. Information faxed to [facility] on [address].

## 2023-01-09 ENCOUNTER — Encounter (HOSPITAL_BASED_OUTPATIENT_CLINIC_OR_DEPARTMENT_OTHER): Payer: Medicare HMO | Admitting: Physical Medicine and Rehabilitation

## 2023-01-09 VITALS — BP 157/82 | HR 81 | Ht <= 58 in | Wt 115.6 lb

## 2023-01-09 DIAGNOSIS — G4701 Insomnia due to medical condition: Secondary | ICD-10-CM

## 2023-01-09 DIAGNOSIS — R4189 Other symptoms and signs involving cognitive functions and awareness: Secondary | ICD-10-CM

## 2023-01-09 DIAGNOSIS — R634 Abnormal weight loss: Secondary | ICD-10-CM

## 2023-01-09 DIAGNOSIS — M5416 Radiculopathy, lumbar region: Secondary | ICD-10-CM | POA: Diagnosis not present

## 2023-01-09 MED ORDER — OXYCODONE HCL 15 MG PO TABS
15.0000 mg | ORAL_TABLET | Freq: Three times a day (TID) | ORAL | 0 refills | Status: DC | PRN
Start: 2023-01-09 — End: 2023-02-06

## 2023-01-09 MED ORDER — AMITRIPTYLINE HCL 25 MG PO TABS: 25.0000 mg | ORAL_TABLET | Freq: Every day | ORAL | 3 refills | Status: AC

## 2023-01-09 NOTE — Progress Notes (Addendum)
Subjective:    Patient ID: Marie Burns, female    DOB: [birth year], 77 y.o.   MRN: [id number]   Marie Burns is a 77 year old woman who presents for f/u of chronic neurogenic claudication, right arm pain, and insomnia  1) Left humeral fracture: Healed.    2) Neuropathic pain in her left leg> right: She has had headache with Cymbalta. Discussed Nucynta as an option.  -she found her lost medication -finds her pain has been severe -feels burning behind the legs.  -she needs refill today -she last filled medication on 1/5 -last week she had just picked up 10 pills that were short on earlier script -she has been having severe flare of her pain -she will bring the bottle to the follow-up -she did not get a full prescription- she only received 80 pills as the pharamacist did not have the full 90 pills. -she was flipping her mattress and found the bottle under the mattress.  -she needs to schedule transport a few days prior to appointments -has had significant improvement with the '15mg'$  dose of oxycodone -she does not want to try Qutenza as it did not seem to help -she would like to wean off her pain medication -she does not find the amitrptyline or mobic helpful -does not need refill at this time -she feels nauseous from the green pills and was told this may be due to sensitivity to the green dye used in the pill -pain has been severe in both lower extremities -oxycodone seems to help but Percocet doe not -amitriptyline is helping -her pain is severe and she asks whether she can take the 71moxycodone three times per day.  -years ago mobic helped her -she did find the tizanidine helpful -she is currently taking oxycodone '15mg'$  TID -she did not find Gabapentin helpful and would not like to try this again. She feels it made her urine smell foul -pain is worst at night and she sleeps poorly due to this -she finds amitriptyline somewhat helpful but does not want to increase the dose.   -worse with standing, walking, sitting. Heating pads do not help, hot shower does not help. Gabapentin caused foul smelling urine.  -not sleeping well at night due to the constant pain -pain radiates down the posterior aspect of both legs -Elavil has helped the pain in the past.  -she wants to retry the oxycodone since that helped her in the past.  Tramadol is not effective at all.  -uses turmeric in her coffee every day.  -patient states she is not getting any benefit from oxycodone currently but this has been the only thing that has helped her in the past so she is not sure why it is not working for her anymore -she does not want to increase the Elavil further -she does not want to try Savella -she is currently taking 2 oxycodone '10mg'$  HCL daily and not getting any relief -she is severely limited in her function by pain  3) Lower extremity swelling: much improved.   4) HTN: BP has been well controlled at home.  120s/70s and she checks 3 times per day  5) Insomnia: She has been sleeping no more than 4 hours per night. -last night she had a hard time sleeping -she feels the amitriptyline is no longer working.   6) Left hip pain: -pain is so severe that she was unable to go get her lumbar MIR and she had to reschedule this. -she would like to try meloxicam again -she  understands to not take it with other NSAIDs -she would also like to increase amitriptyline and oxycodone dose -she would like to get XR of her hip  7) Nausea -she has been throwing up after taking oxycodone ever since she picked up her last refill -she is concerned that she received a bad batch and cannot tolerate taking the medicine, but her pain is very severe -she would like to try percocet instead  8) Family discord -patient is frustrated that family feels her cognition is declining and that she needs help at home  108) Family history of dementia -she has 2 family members with history of dementia and would like  to be tested  7) Losing weight  11) Cognitive impairment -she is following on March 12th for neurology appointment -does not feel she forgets all the time -her long term memory is fine, it is her short term memory (losing her keys) that is an issue  Prior history:  XR shows humeral neck impaction fracture with hemarthrosis. I advised her to go to the ED. She tells me today that she received bilateral arm injections for pain relief in the ED. She cannot recall the name of the medication.   She says she was not seen by an orthopedist and was sent home without any pain medication given that she is under pain contract with me and should have enough of her Percocet left given that she received her last refill on 06/19/20 ([facility] reviewed).   She was able to call the orthopedist who did her right shoulder replacement. I advised her to try to get an appointment today as possible. She was able to do so at 10am so I advised her to go to this instead of come in for our appointment, and hence our phone appointment in place today.   Pain Inventory Average Pain 10 Pain Right Now 8 My pain is constant, sharp, burning, stabbing, and aching  In the last 24 hours, has pain interfered with the following? General activity 9 Relation with others 10 Enjoyment of life 9 What TIME of day is your pain at its worst?  all Sleep (in general) Poor  Pain is worse with: walking, bending, sitting, standing and some activites Pain improves with: medication Relief from Meds: 8   No family history on file. Social History   Socioeconomic History   Marital status: Divorced    Spouse name: Not on file   Number of children: Not on file   Years of education: Not on file   Highest education level: Not on file  Occupational History   Not on file  Tobacco Use   Smoking status: Never   Smokeless tobacco: Never  Vaping Use   Vaping Use: Never used  Substance and Sexual Activity   Alcohol use: No   Drug use: No    Sexual activity: Not on file  Other Topics Concern   Not on file  Social History Narrative   Not on file   Social Determinants of Health   Financial Resource Strain: Not on file  Food Insecurity: Not on file  Transportation Needs: Not on file  Physical Activity: Not on file  Stress: Not on file  Social Connections: Not on file   Past Surgical History:  Procedure Laterality Date   APPENDECTOMY     BREAST SURGERY Left    lymphnodes removed in the left arm X2   REVERSE SHOULDER ARTHROPLASTY Right 05/25/2017   REVERSE SHOULDER ARTHROPLASTY Right 05/25/2017   Procedure: REVERSE  SHOULDER ARTHROPLASTY;  Surgeon: Tania Ade, MD;  Location: [facility];  Service: Orthopedics;  Laterality: Right;  RIGHT REVERSE TOTAL SHOULDER ARTHROPLASTY   Past Medical History:  Diagnosis Date   Arthritis    right shoulder   Complication of anesthesia    hard to wake up   History of kidney stones    Hypertension    Hypothyroidism    Pneumonia    history   RSD upper limb    left    Skin cancer    chest   BP (!) 157/82   Pulse 81   Ht '4\' 10"'$  (1.473 m)   Wt 115 lb 9.6 oz (52.4 kg)   SpO2 98%   BMI 24.16 kg/m   Opioid Risk Score:   Fall Risk Score:  `1  Depression screen PHQ 2/9     09/26/2022    1:55 PM 07/21/2022    1:10 PM 06/28/2022    2:29 PM 05/03/2022    9:25 AM 11/18/2020    2:42 PM 05/20/2020    2:33 PM  Depression screen PHQ 2/9  Decreased Interest 0 0 1 0 0 0  Down, Depressed, Hopeless 0 0 1 0 0 0  PHQ - 2 Score 0 0 2 0 0 0    Review of Systems  Constitutional: Negative.   HENT: Negative.    Eyes: Negative.   Respiratory: Negative.    Cardiovascular:  Positive for leg swelling.  Gastrointestinal: Negative.   Endocrine: Negative.   Genitourinary:  Positive for urgency.  Musculoskeletal:  Positive for back pain and gait problem.  Skin: Negative.   Allergic/Immunologic: Negative.   Neurological:  Positive for numbness.       Tingling  Hematological: Negative.    Psychiatric/Behavioral:         Anxiety, Depression  All other systems reviewed and are negative.      Objective:   Physical Exam Gen: no distress, normal appearing, BMI 24.16, weight is 115 lbs Gen: no distress, normal appearing HEENT: oral mucosa pink and moist, NCAT Cardio: Reg rate Chest: normal effort, normal rate of breathing Abd: soft, non-distended Ext: no edema Psych: pleasant, normal affect Skin: intact Neuro: Loss of train of thought during conversation, antalgic gait    Assessment & Plan:  Mrs. Denninger is a 77 year old woman who presents for f/u of left humeral fracture following a fall, as well as spinal stenosis.   1) History of left humeral fracture: healed  2) Lumbar anterolisthesis: Lumbar MRI 07/2019:  IMPRESSION: 1. Transitional lumbosacral anatomy with partial sacralization of the L5 segment. 2. No significant interval progression of multilevel lumbar spondylosis, most pronounced at the L3-4 level where there is severe stenosis of the bilateral subarticular recesses and severe canal stenosis. 3. Grade 1 anterolisthesis L3 on L4 and L4 on L5.  -failed Qutenza, will not repeat  -refilled oxycodone '15mg'$  to TID, wrote on script not to use green dye as she seems to be sensitive to this.  -discussed that she has called local pharmacies and has been having a hard time finding pharamacies that make oxycodone without the green dye -discussed her current pain flare -discussed increasing amitriptyline dose to help her sleep better  -Continue HEP -Continue hot water showers.  -UDS reviewed and stable -Provided with a pain relief journal and discussed that it contains foods and lifestyle tips to naturally help to improve pain. Discussed that these lifestyle strategies are also very good for health unlike some medications which can have negative side effects.  Discussed that the act of keeping a journal can be therapeutic and helpful to realize patterns what helps  to trigger and alleviate pain.   -Given neuropathic pain and side effects with nerve agents, will start Nucynta daily and decrease the Percocet to 4 tabs daily. May releast on 1/17.   -discussed that she has had success with steroid dose pack in the past -d/c gabapentin given lack of efficacy -will not retry cymbalta since this gave her headache -requested that she bring in her oxycodone for [place] to destroy -discussed gabapentin but she had a negative response with this medication in the past, she feels that it caused her urine to smell foul -discussed increasing Elavil or starting Savella but she prefers not to try given that amitriptyline makes her feel too groggy during the day. Discussed decreasing amitriptyline dose but she defers as does not want her sleep to worsen -Routine drug screens have had expected metabolites.  -Continue turmeric daily with black pepper.  -Continue daily cinnamon.  -Discussed ESI as an option for her pain, she will let me know if she wants to do this.  -Encouraged hot peppers.   -Discussed following foods that may reduce pain: 1) Ginger 2) Blueberries 3) Salmon 4) Pumpkin seeds 5) dark chocolate 6) turmeric 7) tart cherries 8) virgin olive oil 9) chilli peppers 10) mint  Link to further information on diet for chronic pain: [email or web address]   Turmeric to reduce inflammation--can be used in cooking or taken as a supplement.  Benefits of turmeric:  -Highly anti-inflammatory  -Increases antioxidants  -Improves memory, attention, brain disease  -Lowers risk of heart disease  -May help prevent cancer  -Decreases pain  -Alleviates depression  -Delays aging and decreases risk of chronic disease  -Consume with black pepper to increase absorption    Turmeric Milk Recipe:  1 cup milk  1 tsp turmeric  1 tsp cinnamon  1 tsp grated ginger (optional)  Black pepper  (boosts the anti-inflammatory properties of turmeric).  1 tsp honey  Left hip pain: -continue oxycodone -left hip XR ordered and shows mild degeneration Meloxicam '15mg'$  daily prn ordered, advised to not take other NSAIDs with this due to bleeding risk -discussed PT which she would like to consider in the future  Herpes cold sore -topical acyclovir ordered  Insomnia: -increase amitriptyline to '25mg'$  HS -Try to go outside near sunrise -Get exercise during the day.  -Turn off all devices an hour before bedtime.  -Teas that can benefit: chamomile, valerian root, Brahmi (Bacopa) -Can consider over the counter melatonin, magnesium, and/or L-theanine. Melatonin is an anti-oxidant with multiple health benefits. Magnesium is involved in greater than 300 enzymatic reactions in the body and most of [place] are deficient as our soil is often depleted. There are 7 different types of magnesium- Bioptemizer's is a supplement with all 7 types, and each has unique benefits. Magnesium can also help with constipation and anxiety.  -Pistachios naturally increase the production of melatonin -[employer] bamboo bed sheets are free from toxic chemicals.  -Tart cherry juice or a tart cherry supplement can improve sleep and soreness post-workout      Family history of dementia: -referred to neurology to assess for dementia -discussed that her pain medications can negatively affect cognition  Family discord: -discussed her and her family's concerns  Losing weight: -discussed her current diet and she feels she is eating enough -discussed that BMI has increased slightly since last visit   >40 minutes spent in discussion of her pain/its  current distribution and quality, intermittent cognitive impairment/her family history of Alzheimers/how it scares her to think that she could get this/her traumatizing experiences with her parents' disease/that some of her pain medications can negatively affect cognition, that she  feels she is losing weight but eating well but that her BMI has slightly increased, discussed natural tips for sleep, increasing amitirptyline '25mg'$  to help with both her insomnia and pain, refilling her oxycodone

## 2023-01-09 NOTE — Addendum Note (Signed)
Addended by: Izora Ribas on: 01/09/2023 12:39 PM   Modules accepted: Level of Service

## 2023-01-23 ENCOUNTER — Encounter: Payer: Medicare HMO | Attending: Physical Medicine and Rehabilitation | Admitting: Physical Medicine and Rehabilitation

## 2023-01-23 DIAGNOSIS — M5416 Radiculopathy, lumbar region: Secondary | ICD-10-CM | POA: Insufficient documentation

## 2023-01-23 NOTE — Progress Notes (Signed)
Left 2 voicemail today for patient

## 2023-01-24 ENCOUNTER — Ambulatory Visit: Payer: Medicare HMO | Admitting: Psychiatry

## 2023-01-24 ENCOUNTER — Telehealth: Payer: Self-pay | Admitting: Psychiatry

## 2023-01-24 NOTE — Telephone Encounter (Signed)
LVM and sent text msg informing pt of need to reschedule 01/24/23 appointment - MD out

## 2023-01-25 ENCOUNTER — Other Ambulatory Visit: Payer: Self-pay | Admitting: Physical Medicine and Rehabilitation

## 2023-01-25 ENCOUNTER — Telehealth: Payer: Self-pay | Admitting: Physical Medicine and Rehabilitation

## 2023-01-25 ENCOUNTER — Telehealth: Payer: Self-pay | Admitting: *Deleted

## 2023-01-25 MED ORDER — ACYCLOVIR 400 MG PO TABS: 800.0000 mg | ORAL_TABLET | Freq: Two times a day (BID) | ORAL | 3 refills | Status: AC

## 2023-01-25 NOTE — Telephone Encounter (Signed)
Marie Burns (Key: [id number]) Acyclovir '400MG'$  tablets Status: Question Response - N/ACreated: March 13th, 2024

## 2023-01-25 NOTE — Telephone Encounter (Signed)
Patient needs a refill on acyclovir 400 mg.  Pt has 2 left.

## 2023-01-26 ENCOUNTER — Telehealth: Payer: Self-pay | Admitting: *Deleted

## 2023-01-26 NOTE — Telephone Encounter (Signed)
Acyclovir PA:  Information regarding your request Available without authorization.

## 2023-01-30 NOTE — Telephone Encounter (Signed)
Per covermymeds:    Available without authorization.

## 2023-02-06 ENCOUNTER — Encounter (HOSPITAL_BASED_OUTPATIENT_CLINIC_OR_DEPARTMENT_OTHER): Payer: Medicare HMO | Admitting: Physical Medicine and Rehabilitation

## 2023-02-06 DIAGNOSIS — M5416 Radiculopathy, lumbar region: Secondary | ICD-10-CM | POA: Diagnosis not present

## 2023-02-06 MED ORDER — OXYCODONE HCL 15 MG PO TABS: 15.0000 mg | ORAL_TABLET | Freq: Three times a day (TID) | ORAL | 0 refills | Status: AC | PRN

## 2023-02-06 NOTE — Progress Notes (Signed)
Subjective:    Patient ID: Marie Burns, female    DOB: 08-Feb-1946, 77 y.o.   MRN: [id number]  An audio/video tele-health visit is felt to be the most appropriate encounter for this patient at this time. This is a follow up tele-visit via phone. The patient is at home. MD is at office. Prior to scheduling this appointment, our staff discussed the limitations of evaluation and management by telemedicine and the availability of in-person appointments. The patient expressed understanding and agreed to proceed.   Marie Burns is a 77 year old woman who presents for f/u of chronic neurogenic claudication, right arm pain, and insomnia  1) Left humeral fracture: Healed.    2) Neuropathic pain in her left leg> right: She has had headache with Cymbalta. Discussed Nucynta as an option.  -pain has been stable -she asks for refill to be sent -she could not attend her in-person appointment today as her sister fractured her arm and needed her assistance, she has been with her in [place] -has been using the Amitriptyline intermittently as it makes her groggy but it does help her rest -finds her pain has been severe -feels burning behind the legs.  -she needs refill today -she last filled medication on 1/5 -last week she had just picked up 10 pills that were short on earlier script -she has been having severe flare of her pain -she will bring the bottle to the follow-up -she did not get a full prescription- she only received 80 pills as the pharamacist did not have the full 90 pills. -she was flipping her mattress and found the bottle under the mattress.  -she needs to schedule transport a few days prior to appointments -has had significant improvement with the 15mg  dose of oxycodone -she does not want to try Qutenza as it did not seem to help -she would like to wean off her pain medication -she does not find the amitrptyline or mobic helpful -does not need refill at this time -she feels nauseous  from the green pills and was told this may be due to sensitivity to the green dye used in the pill -pain has been severe in both lower extremities -oxycodone seems to help but Percocet doe not -amitriptyline is helping -her pain is severe and she asks whether she can take the 87m oxycodone three times per day.  -years ago mobic helped her -she did find the tizanidine helpful -she is currently taking oxycodone 15mg  TID -she did not find Gabapentin helpful and would not like to try this again. She feels it made her urine smell foul -pain is worst at night and she sleeps poorly due to this -she finds amitriptyline somewhat helpful but does not want to increase the dose.  -worse with standing, walking, sitting. Heating pads do not help, hot shower does not help. Gabapentin caused foul smelling urine.  -not sleeping well at night due to the constant pain -pain radiates down the posterior aspect of both legs -Elavil has helped the pain in the past.  -she wants to retry the oxycodone since that helped her in the past.  Tramadol is not effective at all.  -uses turmeric in her coffee every day.  -patient states she is not getting any benefit from oxycodone currently but this has been the only thing that has helped her in the past so she is not sure why it is not working for her anymore -she does not want to increase the Elavil further -she does not want to try [facility] -  she is currently taking 2 oxycodone 10mg  HCL daily and not getting any relief -she is severely limited in her function by pain  3) Lower extremity swelling: much improved.   4) HTN: BP has been well controlled at home.  120s/70s and she checks 3 times per day  5) Insomnia: She has been sleeping no more than 4 hours per night. -last night she had a hard time sleeping -she feels the amitriptyline is no longer working.   6) Left hip pain: -pain is so severe that she was unable to go get her lumbar MIR and she had to reschedule  this. -she would like to try meloxicam again -she understands to not take it with other NSAIDs -she would also like to increase amitriptyline and oxycodone dose -she would like to get XR of her hip  7) Nausea -she has been throwing up after taking oxycodone ever since she picked up her last refill -she is concerned that she received a bad batch and cannot tolerate taking the medicine, but her pain is very severe -she would like to try percocet instead  8) Family discord -patient is frustrated that family feels her cognition is declining and that she needs help at home  72) Family history of dementia -she has 2 family members with history of dementia and would like to be tested  40) Losing weight  11) Cognitive impairment -she is following on March 12th for neurology appointment -does not feel she forgets all the time -her long term memory is fine, it is her short term memory (losing her keys) that is an issue  Prior history:  XR shows humeral neck impaction fracture with hemarthrosis. I advised her to go to the ED. She tells me today that she received bilateral arm injections for pain relief in the ED. She cannot recall the name of the medication.   She says she was not seen by an orthopedist and was sent home without any pain medication given that she is under pain contract with me and should have enough of her Percocet left given that she received her last refill on 06/19/20 ([place] reviewed).   She was able to call the orthopedist who did her right shoulder replacement. I advised her to try to get an appointment today as possible. She was able to do so at 10am so I advised her to go to this instead of come in for our appointment, and hence our phone appointment in place today.   Pain Inventory Average Pain 10 Pain Right Now 8 My pain is constant, sharp, burning, stabbing, and aching  In the last 24 hours, has pain interfered with the following? General activity 9 Relation with  others 10 Enjoyment of life 9 What TIME of day is your pain at its worst?  all Sleep (in general) Poor  Pain is worse with: walking, bending, sitting, standing and some activites Pain improves with: medication Relief from Meds: 8   No family history on file. Social History   Socioeconomic History   Marital status: Divorced    Spouse name: Not on file   Number of children: Not on file   Years of education: Not on file   Highest education level: Not on file  Occupational History   Not on file  Tobacco Use   Smoking status: Never   Smokeless tobacco: Never  Vaping Use   Vaping Use: Never used  Substance and Sexual Activity   Alcohol use: No   Drug use: No  Sexual activity: Not on file  Other Topics Concern   Not on file  Social History Narrative   Not on file   Social Determinants of Health   Financial Resource Strain: Not on file  Food Insecurity: Not on file  Transportation Needs: Not on file  Physical Activity: Not on file  Stress: Not on file  Social Connections: Not on file   Past Surgical History:  Procedure Laterality Date   APPENDECTOMY     BREAST SURGERY Left    lymphnodes removed in the left arm X2   REVERSE SHOULDER ARTHROPLASTY Right 05/25/2017   REVERSE SHOULDER ARTHROPLASTY Right 05/25/2017   Procedure: REVERSE SHOULDER ARTHROPLASTY;  Surgeon: Tania Ade, MD;  Location: [facility];  Service: Orthopedics;  Laterality: Right;  RIGHT REVERSE TOTAL SHOULDER ARTHROPLASTY   Past Medical History:  Diagnosis Date   Arthritis    right shoulder   Complication of anesthesia    hard to wake up   History of kidney stones    Hypertension    Hypothyroidism    Pneumonia    history   RSD upper limb    left    Skin cancer    chest   There were no vitals taken for this visit.  Opioid Risk Score:   Fall Risk Score:  `1  Depression screen PHQ 2/9     09/26/2022    1:55 PM 07/21/2022    1:10 PM 06/28/2022    2:29 PM 05/03/2022    9:25 AM 11/18/2020     2:42 PM 05/20/2020    2:33 PM  Depression screen PHQ 2/9  Decreased Interest 0 0 1 0 0 0  Down, Depressed, Hopeless 0 0 1 0 0 0  PHQ - 2 Score 0 0 2 0 0 0    Review of Systems  Constitutional: Negative.   HENT: Negative.    Eyes: Negative.   Respiratory: Negative.    Cardiovascular:  Positive for leg swelling.  Gastrointestinal: Negative.   Endocrine: Negative.   Genitourinary:  Positive for urgency.  Musculoskeletal:  Positive for back pain and gait problem.  Skin: Negative.   Allergic/Immunologic: Negative.   Neurological:  Positive for numbness.       Tingling  Hematological: Negative.   Psychiatric/Behavioral:         Anxiety, Depression  All other systems reviewed and are negative.      Objective:   Physical Exam Not performed    Assessment & Plan:  Mrs. Nijjar is a 77 year old woman who presents for f/u of left humeral fracture following a fall, as well as spinal stenosis.   1) History of left humeral fracture: healed  2) Lumbar anterolisthesis: Lumbar MRI 07/2019:  IMPRESSION: 1. Transitional lumbosacral anatomy with partial sacralization of the L5 segment. 2. No significant interval progression of multilevel lumbar spondylosis, most pronounced at the L3-4 level where there is severe stenosis of the bilateral subarticular recesses and severe canal stenosis. 3. Grade 1 anterolisthesis L3 on L4 and L4 on L5.  -failed Qutenza, will not repeat  -refilled oxycodone 15mg  to TID, wrote on script not to use green dye as she seems to be sensitive to this.  -discussed that she has called local pharmacies and has been having a hard time finding pharamacies that make oxycodone without the green dye -discussed her current pain flare -discussed increasing amitriptyline dose to help her sleep better, commended on using intermittently and discussed that this will allow it to be more efficacious for her for  longer than using it every day  -Continue HEP -Continue hot water  showers.  -UDS reviewed and stable -Provided with a pain relief journal and discussed that it contains foods and lifestyle tips to naturally help to improve pain. Discussed that these lifestyle strategies are also very good for health unlike some medications which can have negative side effects. Discussed that the act of keeping a journal can be therapeutic and helpful to realize patterns what helps to trigger and alleviate pain.   -Given neuropathic pain and side effects with nerve agents, will start Nucynta daily and decrease the Percocet to 4 tabs daily. May releast on 1/17.   -discussed that she has had success with steroid dose pack in the past -d/c gabapentin given lack of efficacy -will not retry cymbalta since this gave her headache -requested that she bring in her oxycodone for [place] to destroy -discussed gabapentin but she had a negative response with this medication in the past, she feels that it caused her urine to smell foul -discussed increasing Elavil or starting Savella but she prefers not to try given that amitriptyline makes her feel too groggy during the day. Discussed decreasing amitriptyline dose but she defers as does not want her sleep to worsen -Routine drug screens have had expected metabolites.  -Continue turmeric daily with black pepper.  -Continue daily cinnamon.  -Discussed ESI as an option for her pain, she will let me know if she wants to do this.  -Encouraged hot peppers.   -Discussed following foods that may reduce pain: 1) Ginger 2) Blueberries 3) Salmon 4) Pumpkin seeds 5) dark chocolate 6) turmeric 7) tart cherries 8) virgin olive oil 9) chilli peppers 10) mint  Link to further information on diet for chronic pain: http://www.randall.com/   Turmeric to reduce inflammation--can be used in cooking or taken as a supplement.  Benefits of turmeric:  -Highly  anti-inflammatory  -Increases antioxidants  -Improves memory, attention, brain disease  -Lowers risk of heart disease  -May help prevent cancer  -Decreases pain  -Alleviates depression  -Delays aging and decreases risk of chronic disease  -Consume with black pepper to increase absorption    Turmeric Milk Recipe:  1 cup milk  1 tsp turmeric  1 tsp cinnamon  1 tsp grated ginger (optional)  Black pepper (boosts the anti-inflammatory properties of turmeric).  1 tsp honey  Left hip pain: -continue oxycodone -left hip XR ordered and shows mild degeneration Meloxicam 15mg  daily prn ordered, advised to not take other NSAIDs with this due to bleeding risk -discussed PT which she would like to consider in the future  Herpes cold sore -topical acyclovir ordered  Insomnia: -increase amitriptyline to 25mg  HS -Try to go outside near sunrise -Get exercise during the day.  -Turn off all devices an hour before bedtime.  -Teas that can benefit: chamomile, valerian root, Brahmi (Bacopa) -Can consider over the counter melatonin, magnesium, and/or L-theanine. Melatonin is an anti-oxidant with multiple health benefits. Magnesium is involved in greater than 300 enzymatic reactions in the body and most of [place] are deficient as our soil is often depleted. There are 7 different types of magnesium- Bioptemizer's is a supplement with all 7 types, and each has unique benefits. Magnesium can also help with constipation and anxiety.  -Pistachios naturally increase the production of melatonin -[employer]zy [employer] bamboo bed sheets are free from toxic chemicals.  -Tart cherry juice or a tart cherry supplement can improve sleep and soreness post-workout      Family history of  dementia: -referred to neurology to assess for dementia -discussed that her pain medications can negatively affect cognition  Family discord: -discussed her and her family's concerns  Losing weight: -discussed her current  diet and she feels she is eating enough -discussed that BMI has increased slightly since last visit   5 minutes spent in discussion of her pain, why she could not attend in-person appointment today as she is helping her sister after her arm fracture

## 2023-02-08 ENCOUNTER — Telehealth: Payer: Self-pay | Admitting: Physical Medicine and Rehabilitation

## 2023-02-08 NOTE — Telephone Encounter (Signed)
Patient is asking for [place] to call pharmacy. They are telling her they need [place] to call to approve it to be filled early.

## 2023-02-08 NOTE — Telephone Encounter (Addendum)
I spoke with Marie Burns and she took more of her medication because she was hurting more. I have cautioned her that she is not allowed to increase her medication on her own. She says she has two pills left but the refill was due 4/3 because per PMP she filled it 01/16/23. The pharmacist has refused to release the medication (they say it is due 02/17/23) without a call from Dr Ranell Patrick to approve the early refill. Dr Ranell Patrick says she has discussed with patient multiple times about this and will not refill early. I informed her of this and told her she will be receiving a FINAL warning letter about independently increasing her meds without permission.

## 2023-02-21 ENCOUNTER — Telehealth: Payer: Self-pay | Admitting: *Deleted
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
## 2023-03-06 ENCOUNTER — Ambulatory Visit: Payer: Medicare HMO | Admitting: Physical Medicine and Rehabilitation

## 2023-03-07 ENCOUNTER — Ambulatory Visit: Payer: Medicare HMO | Admitting: Psychiatry

## 2023-03-07 DIAGNOSIS — G894 Chronic pain syndrome: Secondary | ICD-10-CM | POA: Diagnosis not present

## 2023-03-07 DIAGNOSIS — M5416 Radiculopathy, lumbar region: Secondary | ICD-10-CM | POA: Diagnosis not present

## 2023-03-07 DIAGNOSIS — E559 Vitamin D deficiency, unspecified: Secondary | ICD-10-CM | POA: Diagnosis not present

## 2023-03-07 DIAGNOSIS — M129 Arthropathy, unspecified: Secondary | ICD-10-CM | POA: Diagnosis not present

## 2023-03-07 DIAGNOSIS — E663 Overweight: Secondary | ICD-10-CM | POA: Diagnosis not present

## 2023-03-07 DIAGNOSIS — R030 Elevated blood-pressure reading, without diagnosis of hypertension: Secondary | ICD-10-CM | POA: Diagnosis not present

## 2023-03-07 DIAGNOSIS — Z79899 Other long term (current) drug therapy: Secondary | ICD-10-CM | POA: Diagnosis not present

## 2023-03-15 DIAGNOSIS — I10 Essential (primary) hypertension: Secondary | ICD-10-CM | POA: Diagnosis not present

## 2023-03-15 DIAGNOSIS — G8929 Other chronic pain: Secondary | ICD-10-CM | POA: Diagnosis not present

## 2023-03-15 DIAGNOSIS — R7303 Prediabetes: Secondary | ICD-10-CM | POA: Diagnosis not present

## 2023-03-15 DIAGNOSIS — E785 Hyperlipidemia, unspecified: Secondary | ICD-10-CM | POA: Diagnosis not present

## 2023-03-15 DIAGNOSIS — J449 Chronic obstructive pulmonary disease, unspecified: Secondary | ICD-10-CM | POA: Diagnosis not present

## 2023-03-15 DIAGNOSIS — E559 Vitamin D deficiency, unspecified: Secondary | ICD-10-CM | POA: Diagnosis not present

## 2023-03-15 DIAGNOSIS — E039 Hypothyroidism, unspecified: Secondary | ICD-10-CM | POA: Diagnosis not present

## 2023-03-15 DIAGNOSIS — E538 Deficiency of other specified B group vitamins: Secondary | ICD-10-CM | POA: Diagnosis not present

## 2023-03-15 DIAGNOSIS — Z0000 Encounter for general adult medical examination without abnormal findings: Secondary | ICD-10-CM | POA: Diagnosis not present

## 2023-03-16 ENCOUNTER — Other Ambulatory Visit: Payer: Self-pay | Admitting: Family Medicine

## 2023-03-16 DIAGNOSIS — E2839 Other primary ovarian failure: Secondary | ICD-10-CM

## 2023-04-12 DIAGNOSIS — G8929 Other chronic pain: Secondary | ICD-10-CM | POA: Diagnosis not present

## 2023-04-12 DIAGNOSIS — G894 Chronic pain syndrome: Secondary | ICD-10-CM | POA: Diagnosis not present

## 2023-04-12 DIAGNOSIS — M25512 Pain in left shoulder: Secondary | ICD-10-CM | POA: Diagnosis not present

## 2023-04-12 DIAGNOSIS — M48061 Spinal stenosis, lumbar region without neurogenic claudication: Secondary | ICD-10-CM | POA: Diagnosis not present

## 2023-04-12 DIAGNOSIS — M25511 Pain in right shoulder: Secondary | ICD-10-CM | POA: Diagnosis not present

## 2023-04-12 DIAGNOSIS — M1990 Unspecified osteoarthritis, unspecified site: Secondary | ICD-10-CM | POA: Diagnosis not present

## 2023-04-12 DIAGNOSIS — N289 Disorder of kidney and ureter, unspecified: Secondary | ICD-10-CM | POA: Diagnosis not present

## 2023-04-12 DIAGNOSIS — G9050 Complex regional pain syndrome I, unspecified: Secondary | ICD-10-CM | POA: Diagnosis not present

## 2023-04-12 DIAGNOSIS — E039 Hypothyroidism, unspecified: Secondary | ICD-10-CM | POA: Diagnosis not present

## 2023-05-03 ENCOUNTER — Telehealth: Payer: Self-pay | Admitting: Family Medicine

## 2023-05-03 NOTE — Telephone Encounter (Signed)
I mailed a release for information and Dr Evangeline Gula business card as requested.

## 2023-05-16 ENCOUNTER — Encounter: Payer: Self-pay | Admitting: Family Medicine

## 2023-05-16 ENCOUNTER — Ambulatory Visit (INDEPENDENT_AMBULATORY_CARE_PROVIDER_SITE_OTHER): Payer: Medicare HMO | Admitting: Family Medicine

## 2023-05-16 VITALS — BP 130/60 | HR 72 | Temp 97.4°F | Ht <= 58 in | Wt 115.6 lb

## 2023-05-16 DIAGNOSIS — E7800 Pure hypercholesterolemia, unspecified: Secondary | ICD-10-CM

## 2023-05-16 DIAGNOSIS — M791 Myalgia, unspecified site: Secondary | ICD-10-CM

## 2023-05-16 DIAGNOSIS — G894 Chronic pain syndrome: Secondary | ICD-10-CM

## 2023-05-16 DIAGNOSIS — E039 Hypothyroidism, unspecified: Secondary | ICD-10-CM | POA: Diagnosis not present

## 2023-05-16 DIAGNOSIS — I10 Essential (primary) hypertension: Secondary | ICD-10-CM | POA: Diagnosis not present

## 2023-05-16 DIAGNOSIS — R7303 Prediabetes: Secondary | ICD-10-CM | POA: Diagnosis not present

## 2023-05-16 DIAGNOSIS — E538 Deficiency of other specified B group vitamins: Secondary | ICD-10-CM | POA: Diagnosis not present

## 2023-05-16 NOTE — Progress Notes (Addendum)
New Patient Office Visit  Subjective    Patient ID: Marie Burns, female    DOB: [birth year]  Age: 77 y.o. MRN: [id number]  CC:  Chief Complaint  Patient presents with   Establish Care    Pt is in office to establish care for a new Dr. Treat her HTN, back pain  and overall health.     HPI [name] presents to establish care. Encounter Diagnoses  Name Primary?   Essential hypertension Yes   Chronic pain syndrome    Hypothyroidism, unspecified type    B12 deficiency    Elevated cholesterol    Myalgia due to statin    Prediabetes    For establishment of care.  Past medical history to include hypertension, hypothyroidism, elevated cholesterol but statin intolerant, prediabetes, B12 deficiency and chronic pain syndrome.  Hypertension is treated with lisinopril 20 and HCTZ 25.  Currently taking levothyroxine 75 mcg daily for hypothyroidism.  Prediabetes is treated with diet therapy.  B12 deficiency is treated 50 mcg of B12.  Chronic pain is managed by pain management.  She has not had issues with requests for early refills and was discharged from her last clinic.  She is originally from [place] and retired as a Nutritional therapist.  She lives alone.  She has a daughter and her granddaughter who live relatively close.  She is a dentulous and does not require regular dental care.  Outpatient Encounter Medications as of 05/16/2023  Medication Sig   acetaminophen (TYLENOL) 325 MG tablet Take 650 mg by mouth every 6 (six) hours as needed for moderate pain.   acyclovir (ZOVIRAX) 400 MG tablet Take 2 tablets (800 mg total) by mouth 2 (two) times daily.   aspirin-acetaminophen-caffeine (EXCEDRIN MIGRAINE) 250-250-65 MG tablet Take 1 tablet by mouth every 6 (six) hours as needed for headache.   Cholecalciferol (VITAMIN D) 50 MCG (2000 UT) CAPS 1 tablet   cyanocobalamin (VITAMIN B12) 1000 MCG tablet Take 1 tablet (1,000 mcg total) by mouth daily.   fluticasone (FLONASE) 50  MCG/ACT nasal spray Place 1 spray into both nostrils daily.   hydrochlorothiazide (HYDRODIURIL) 25 MG tablet 1 tablet   levothyroxine (SYNTHROID) 75 MCG tablet Take 75 mcg by mouth every morning.   lisinopril (PRINIVIL,ZESTRIL) 20 MG tablet Take 20 mg by mouth daily at 12 noon.   Multiple Vitamin (MULTIVITAMIN WITH MINERALS) TABS tablet Take 1 tablet by mouth daily.   naloxone (NARCAN) nasal spray 4 mg/0.1 mL SMARTSIG:1 Both Nares Daily   nystatin ointment (MYCOSTATIN)    SUMAtriptan (IMITREX) 25 MG tablet See admin instructions.   SUMAtriptan (IMITREX) 50 MG tablet Take by mouth.   tiZANidine (ZANAFLEX) 4 MG tablet Take 1 tablet (4 mg total) by mouth at bedtime as needed for muscle spasms.   traMADol (ULTRAM) 50 MG tablet Take 50 mg by mouth 3 (three) times daily as needed.   valACYclovir (VALTREX) 500 MG tablet    [DISCONTINUED] vitamin B-12 (CYANOCOBALAMIN) 50 MCG tablet Take 50 mcg by mouth daily.   acyclovir ointment (ZOVIRAX) 5 % Apply 1 Application topically every 4 (four) hours. (Patient not taking: Reported on 05/16/2023)   albuterol (VENTOLIN HFA) 108 (90 Base) MCG/ACT inhaler 1 puff as needed (Patient not taking: Reported on 05/16/2023)   amitriptyline (ELAVIL) 25 MG tablet Take 1 tablet (25 mg total) by mouth at bedtime. (Patient not taking: Reported on 05/16/2023)   diclofenac Sodium (VOLTAREN) 1 % GEL Apply 2 g topically 4 (four) times daily. (Patient not taking: Reported  on 05/16/2023)   ipratropium (ATROVENT) 0.06 % nasal spray Place into both nostrils. (Patient not taking: Reported on 05/16/2023)   meloxicam (MOBIC) 15 MG tablet Daily prn with food (Patient not taking: Reported on 05/16/2023)   ondansetron (ZOFRAN) 4 MG tablet Take 1 tablet (4 mg total) by mouth every 8 (eight) hours as needed for nausea or vomiting. (Patient not taking: Reported on 05/16/2023)   oxyCODONE (ROXICODONE) 15 MG immediate release tablet Take 1 tablet (15 mg total) by mouth 3 (three) times daily as needed for pain.  (Patient not taking: Reported on 05/16/2023)   [DISCONTINUED] levothyroxine (SYNTHROID) 100 MCG tablet  (Patient not taking: Reported on 05/16/2023)   No facility-administered encounter medications on file as of 05/16/2023.    Past Medical History:  Diagnosis Date   Arthritis    right shoulder   Complication of anesthesia    hard to wake up   History of kidney stones    Hypertension    Hypothyroidism    Pneumonia    history   RSD upper limb    left    Skin cancer    chest    Past Surgical History:  Procedure Laterality Date   APPENDECTOMY     BREAST SURGERY Left    lymphnodes removed in the left arm X2   REVERSE SHOULDER ARTHROPLASTY Right 05/25/2017   REVERSE SHOULDER ARTHROPLASTY Right 05/25/2017   Procedure: REVERSE SHOULDER ARTHROPLASTY;  Surgeon: Jones Broom, MD;  Location: [facility];  Service: Orthopedics;  Laterality: Right;  RIGHT REVERSE TOTAL SHOULDER ARTHROPLASTY    History reviewed. No pertinent family history.  Social History   Socioeconomic History   Marital status: Divorced    Spouse name: Not on file   Number of children: Not on file   Years of education: Not on file   Highest education level: Not on file  Occupational History   Not on file  Tobacco Use   Smoking status: Never   Smokeless tobacco: Never  Vaping Use   Vaping Use: Never used  Substance and Sexual Activity   Alcohol use: No   Drug use: No   Sexual activity: Not on file  Other Topics Concern   Not on file  Social History Narrative   Not on file   Social Determinants of Health   Financial Resource Strain: Not on file  Food Insecurity: Not on file  Transportation Needs: Not on file  Physical Activity: Not on file  Stress: Not on file  Social Connections: Not on file  Intimate Partner Violence: Not on file    Review of Systems  Constitutional: Negative.   HENT: Negative.    Eyes:  Negative for blurred vision, discharge and redness.  Respiratory: Negative.     Cardiovascular: Negative.   Gastrointestinal:  Negative for abdominal pain.  Genitourinary: Negative.   Musculoskeletal: Negative.  Negative for myalgias.  Skin:  Negative for rash.  Neurological:  Negative for tingling, loss of consciousness and weakness.  Endo/Heme/Allergies:  Negative for polydipsia.          Objective    BP 130/60   Pulse 72   Temp (!) 97.4 F (36.3 C)   Ht 4\' 10"  (1.473 m)   Wt 115 lb 9.6 oz (52.4 kg)   BMI 24.16 kg/m   Physical Exam Constitutional:      General: She is not in acute distress.    Appearance: Normal appearance. She is not ill-appearing, toxic-appearing or diaphoretic.  HENT:     Head:  Normocephalic and atraumatic.     Right Ear: External ear normal.     Left Ear: External ear normal.  Eyes:     General: No scleral icterus.       Right eye: No discharge.        Left eye: No discharge.     Extraocular Movements: Extraocular movements intact.     Conjunctiva/sclera: Conjunctivae normal.     Pupils: Pupils are equal, round, and reactive to light.  Cardiovascular:     Rate and Rhythm: Normal rate and regular rhythm.  Pulmonary:     Effort: Pulmonary effort is normal. No respiratory distress.     Breath sounds: Normal breath sounds. No wheezing or rales.  Musculoskeletal:     Cervical back: No rigidity or tenderness.  Skin:    General: Skin is warm and dry.  Neurological:     Mental Status: She is alert and oriented to person, place, and time.  Psychiatric:        Mood and Affect: Mood normal.        Behavior: Behavior normal.         Assessment & Plan:   Essential hypertension -     Basic metabolic panel -     CBC  Chronic pain syndrome -     Ambulatory referral to Pain Clinic  Hypothyroidism, unspecified type -     TSH  B12 deficiency -     Vitamin B12 -     Vitamin B-12; Take 1 tablet (1,000 mcg total) by mouth daily.  Dispense: 90 tablet; Refill: 1  Elevated cholesterol  Myalgia due to  statin  Prediabetes -     Hemoglobin A1c     Return in about 3 months (around 08/16/2023).  Will obtain basic labs for her today.  Explained that I would not be able to treat her chronic pain.  Will refer her to pain management.  Advised to follow-up with their recommendations specifically.   Mliss Sax, MD

## 2023-05-17 LAB — BASIC METABOLIC PANEL
BUN: 38 mg/dL — ABNORMAL HIGH (ref 6–23)
CO2: 21 mEq/L (ref 19–32)
Calcium: 9.3 mg/dL (ref 8.4–10.5)
Chloride: 106 mEq/L (ref 96–112)
Creatinine, Ser: 1.03 mg/dL (ref 0.40–1.20)
GFR: 52.41 mL/min — ABNORMAL LOW (ref >60.00)
Glucose, Bld: 97 mg/dL (ref 70–99)
Potassium: 4.9 mEq/L (ref 3.5–5.1)
Sodium: 134 mEq/L — ABNORMAL LOW (ref 135–145)

## 2023-05-17 LAB — TSH: TSH: 1.21 u[IU]/mL (ref 0.35–5.50)

## 2023-05-17 LAB — CBC
HCT: 34.9 % — ABNORMAL LOW (ref 36.0–46.0)
Hemoglobin: 11 g/dL — ABNORMAL LOW (ref 12.0–15.0)
MCHC: 31.6 g/dL (ref 30.0–36.0)
MCV: 97.1 fl (ref 78.0–100.0)
Platelets: 330 10*3/uL (ref 150.0–400.0)
RBC: 3.6 Mil/uL — ABNORMAL LOW (ref 3.87–5.11)
RDW: 17.3 % — ABNORMAL HIGH (ref 11.5–15.5)
WBC: 7.8 10*3/uL (ref 4.0–10.5)

## 2023-05-17 LAB — HEMOGLOBIN A1C: Hgb A1c MFr Bld: 6.1 % (ref 4.6–6.5)

## 2023-05-17 LAB — VITAMIN B12: Vitamin B-12: 385 pg/mL (ref 211–911)

## 2023-05-19 ENCOUNTER — Telehealth: Payer: Self-pay | Admitting: Family Medicine

## 2023-05-19 MED ORDER — VITAMIN B-12 1000 MCG PO TABS: 1000.0000 ug | ORAL_TABLET | Freq: Every day | ORAL | 1 refills | Status: DC

## 2023-05-19 NOTE — Addendum Note (Signed)
Addended by: Andrez Grime on: 05/19/2023 09:59 AM   Modules accepted: Orders

## 2023-05-19 NOTE — Telephone Encounter (Signed)
[facility] [phone]  Pt is concerned about her kidney function results. She would like a call to explain the results.

## 2023-05-22 ENCOUNTER — Telehealth: Payer: Self-pay

## 2023-05-22 NOTE — Telephone Encounter (Signed)
Pt called stating she is in pain. Sent message to Doc of the Day Janee Morn) in PCP's absence this week.

## 2023-05-22 NOTE — Telephone Encounter (Signed)
Patient is aware of annotation below and verbalized understanding.  

## 2023-06-03 ENCOUNTER — Other Ambulatory Visit: Payer: Self-pay | Admitting: Physical Medicine and Rehabilitation

## 2023-06-13 ENCOUNTER — Ambulatory Visit: Payer: Medicare HMO | Admitting: Psychiatry

## 2023-06-20 ENCOUNTER — Ambulatory Visit (HOSPITAL_COMMUNITY)
Admission: RE | Admit: 2023-06-20 | Discharge: 2023-06-20 | Disposition: A | Discharge status: Home / Self Care | Payer: Medicare HMO | Source: Ambulatory Visit | Attending: Nurse Practitioner | Admitting: Nurse Practitioner

## 2023-06-20 ENCOUNTER — Encounter (HOSPITAL_COMMUNITY): Payer: Self-pay

## 2023-06-20 VITALS — BP 147/90 | HR 98 | Temp 98.6°F | Resp 16

## 2023-06-20 DIAGNOSIS — R031 Nonspecific low blood-pressure reading: Secondary | ICD-10-CM | POA: Diagnosis not present

## 2023-06-20 DIAGNOSIS — I10 Essential (primary) hypertension: Secondary | ICD-10-CM | POA: Diagnosis not present

## 2023-06-20 DIAGNOSIS — R6889 Other general symptoms and signs: Secondary | ICD-10-CM | POA: Diagnosis not present

## 2023-06-20 DIAGNOSIS — M5432 Sciatica, left side: Secondary | ICD-10-CM

## 2023-06-20 DIAGNOSIS — E039 Hypothyroidism, unspecified: Secondary | ICD-10-CM | POA: Diagnosis not present

## 2023-06-20 DIAGNOSIS — G8929 Other chronic pain: Secondary | ICD-10-CM | POA: Diagnosis not present

## 2023-06-20 MED ORDER — TIZANIDINE HCL 2 MG PO TABS: 2.0000 mg | ORAL_TABLET | Freq: Three times a day (TID) | ORAL | 0 refills | Status: AC | PRN

## 2023-06-20 MED ORDER — DEXAMETHASONE SODIUM PHOSPHATE 10 MG/ML IJ SOLN
10.0000 mg | Freq: Once | INTRAMUSCULAR | Status: AC
  Administered 2023-06-20: 10 mg via INTRAMUSCULAR

## 2023-06-20 MED ORDER — DEXAMETHASONE SODIUM PHOSPHATE 10 MG/ML IJ SOLN
INTRAMUSCULAR | Status: AC
  Filled 2023-06-20: qty 1

## 2023-06-20 NOTE — ED Triage Notes (Signed)
Here for left leg pain and burning. Pt reports her calf is sore. Pt states she has a history of sciatica.

## 2023-06-20 NOTE — ED Provider Notes (Signed)
[facility]    CSN: [id number] Arrival date & time: 06/20/23  1914      History   Chief Complaint Chief Complaint  Patient presents with   Leg Pain    HPI Marie Burns is a 77 y.o. female.   Patient presents today with female family member for month long history of left-sided hip/sciatic pain.  Reports over the past week, the pain has worsened.  Denies any recent fall, trauma, or injury to the left hip or left leg.  Reports the pain shoots down to the ball of her foot.  No numbness or tingling in the toes, decree sensation of the lower extremities, or weakness.  Reports she has chronic pain and takes tramadol, has also tried Tylenol and multiple NSAIDs without much improvement.  Reports that she has tried a muscle relaxant in the past that is ineffective for her.  Reports when it is this bad, she usually gets "a pain shot."  No new urinary symptoms or incontinence of bowel/bladder.  Review of chart shows recent A1c 6.1%.    Past Medical History:  Diagnosis Date   Arthritis    right shoulder   Complication of anesthesia    hard to wake up   History of kidney stones    Hypertension    Hypothyroidism    Pneumonia    history   RSD upper limb    left    Skin cancer    chest    Patient Active Problem List   Diagnosis Date Noted   S/P reverse total shoulder arthroplasty, right 05/25/2017    Past Surgical History:  Procedure Laterality Date   APPENDECTOMY     BREAST SURGERY Left    lymphnodes removed in the left arm X2   REVERSE SHOULDER ARTHROPLASTY Right 05/25/2017   REVERSE SHOULDER ARTHROPLASTY Right 05/25/2017   Procedure: REVERSE SHOULDER ARTHROPLASTY;  Surgeon: Jones Broom, MD;  Location: [facility] OR;  Service: Orthopedics;  Laterality: Right;  RIGHT REVERSE TOTAL SHOULDER ARTHROPLASTY    OB History   No obstetric history on file.      Home Medications    Prior to Admission medications   Medication Sig Start Date End Date Taking?  Authorizing Provider  acetaminophen (TYLENOL) 325 MG tablet Take 650 mg by mouth every 6 (six) hours as needed for moderate pain.   Yes [provider]  albuterol (VENTOLIN HFA) 108 (90 Base) MCG/ACT inhaler  01/06/21  Yes [provider]  amitriptyline (ELAVIL) 25 MG tablet Take 1 tablet (25 mg total) by mouth at bedtime. 01/09/23  Yes Raulkar, Drema Pry, MD  aspirin-acetaminophen-caffeine (EXCEDRIN MIGRAINE) [phone] MG tablet Take 1 tablet by mouth every 6 (six) hours as needed for headache.   Yes [provider]  Cholecalciferol (VITAMIN D) 50 MCG (2000 UT) CAPS 1 tablet   Yes [provider]  cyanocobalamin (VITAMIN B12) 1000 MCG tablet Take 1 tablet (1,000 mcg total) by mouth daily. 05/19/23  Yes Mliss Sax, MD  fluticasone [facility]) 50 MCG/ACT nasal spray Place 1 spray into both nostrils daily. 07/15/22  Yes [provider]  hydrochlorothiazide (HYDRODIURIL) 25 MG tablet 1 tablet   Yes [provider]  levothyroxine (SYNTHROID) 75 MCG tablet Take 75 mcg by mouth every morning. 06/20/22  Yes [provider]  lisinopril (PRINIVIL,ZESTRIL) 20 MG tablet Take 20 mg by mouth daily at 12 noon. 04/07/17  Yes [provider]  meloxicam (MOBIC) 15 MG tablet Daily prn with food 06/22/22  Yes Raulkar,  Drema Pry, MD  Multiple Vitamin (MULTIVITAMIN WITH MINERALS) TABS tablet Take 1 tablet by mouth daily.   Yes [provider]  naloxone [facility]) nasal spray 4 mg/0.1 mL SMARTSIG:1 Both Nares Daily 05/10/22  Yes [provider]  ondansetron (ZOFRAN) 4 MG tablet Take 1 tablet (4 mg total) by mouth every 8 (eight) hours as needed for nausea or vomiting. 10/27/22  Yes Raulkar, Drema Pry, MD  SUMAtriptan (IMITREX) 25 MG tablet See admin instructions.   Yes [provider]  SUMAtriptan (IMITREX) 50 MG tablet Take by mouth. 07/15/22  Yes [provider]  tiZANidine (ZANAFLEX) 2 MG tablet Take 1 tablet (2 mg  total) by mouth every 8 (eight) hours as needed for muscle spasms. Do not take with alcohol or while driving or operating heavy machinery.  May cause drowsiness. 06/20/23  Yes Valentino Nose, NP  traMADol (ULTRAM) 50 MG tablet Take 50 mg by mouth 3 (three) times daily as needed. 05/12/23  Yes [provider]  valACYclovir (VALTREX) 500 MG tablet  11/11/19  Yes [provider]  acyclovir (ZOVIRAX) 400 MG tablet Take 2 tablets (800 mg total) by mouth 2 (two) times daily. 01/25/23   Raulkar, Drema Pry, MD  acyclovir ointment (ZOVIRAX) 5 % Apply 1 Application topically every 4 (four) hours. Patient not taking: Reported on 05/16/2023 07/08/22   Horton Chin, MD  diclofenac Sodium (VOLTAREN) 1 % GEL Apply 2 g topically 4 (four) times daily. Patient not taking: Reported on 05/16/2023 07/08/22   Horton Chin, MD  ipratropium (ATROVENT) 0.06 % nasal spray Place into both nostrils. Patient not taking: Reported on 05/16/2023 04/12/22   [provider]  nystatin ointment (MYCOSTATIN)  07/06/20   [provider]  oxyCODONE (ROXICODONE) 15 MG immediate release tablet Take 1 tablet (15 mg total) by mouth 3 (three) times daily as needed for pain. Patient not taking: Reported on 05/16/2023 02/06/23   Horton Chin, MD    Family History History reviewed. No pertinent family history.  Social History Social History   Tobacco Use   Smoking status: Never   Smokeless tobacco: Never  Vaping Use   Vaping status: Never Used  Substance Use Topics   Alcohol use: No   Drug use: No     Allergies   Gabapentin, Pregabalin, and Zoster vac recomb adjuvanted   Review of Systems Review of Systems Per HPI  Physical Exam Triage Vital Signs ED Triage Vitals [06/20/23 1010]  Encounter Vitals Group     BP (!) 147/90     Systolic BP Percentile      Diastolic BP Percentile      Pulse Rate 98     Resp 16     Temp 98.6 F (37 C)     Temp Source Oral     SpO2 98 %      Weight      Height      Head Circumference      Peak Flow      Pain Score      Pain Loc      Pain Education      Exclude from Growth Chart    No data found.  Updated Vital Signs BP (!) 147/90 (BP Location: Left Arm)   Pulse 98   Temp 98.6 F (37 C) (Oral)   Resp 16   SpO2 98%   Visual Acuity Right Eye Distance:   Left Eye Distance:   Bilateral Distance:    Right  Eye Near:   Left Eye Near:    Bilateral Near:     Physical Exam Vitals and nursing note reviewed.  Constitutional:      General: She is not in acute distress.    Appearance: Normal appearance. She is not toxic-appearing.  HENT:     Mouth/Throat:     Mouth: Mucous membranes are moist.     Pharynx: Oropharynx is clear.  Pulmonary:     Effort: Pulmonary effort is normal. No respiratory distress.  Musculoskeletal:       Legs:     Comments: Inspection: no swelling, bruising, obvious deformity or redness to left buttock or hip Palpation: tender to palpation in area marked; pain as depicted; no obvious deformities palpated ROM: Difficult to assess secondary to pain Strength: 5/5 bilateral lower extremities Neurovascular: neurovascularly intact in bilateral lower extremities  Skin:    General: Skin is warm and dry.     Capillary Refill: Capillary refill takes less than 2 seconds.     Coloration: Skin is not jaundiced or pale.     Findings: No erythema.  Neurological:     Mental Status: She is alert and oriented to person, place, and time.  Psychiatric:        Behavior: Behavior is cooperative.      UC Treatments / Results  Labs (all labs ordered are listed, but only abnormal results are displayed) Labs Reviewed - No data to display  EKG   Radiology No results found.  Procedures Procedures (including critical care time)  Medications Ordered in UC Medications  dexamethasone (DECADRON) injection 10 mg (10 mg Intramuscular Given 06/20/23 1038)    Initial Impression / Assessment and Plan / UC  Course  I have reviewed the triage vital signs and the nursing notes.  Pertinent labs & imaging results that were available during my care of the patient were reviewed by me and considered in my medical decision making (see chart for details).   Patient is well-appearing, normotensive, afebrile, not tachycardic, not tachypneic, oxygenating well on room air.    1. Sciatica of left side Patient is not a good candidate for NSAIDs/Toradol secondary to recent GFR low and age and I explained this to the patient Decadron 10 mg IM given in urgent care for pain relief/inflammation Recommended starting tizanidine at home as needed for muscular pain Will follow-up with PCP/pain management with no improvement or worsening symptoms despite treatment Strict ER precautions discussed with patient  The patient was given the opportunity to ask questions.  All questions answered to their satisfaction.  The patient is in agreement to this plan.    Final Clinical Impressions(s) / UC Diagnoses   Final diagnoses:  Sciatica of left side     Discharge Instructions      We gave you a steroid shot today to help with pain in your left side.  You can take the tizanidine every 8 hours as needed.  Please follow up with PCP if symptoms persist despite treatment.  If pain worsens or you develop numbness/tingling in your foot or are unable to bear weight on your left leg, go to the ER.    ED Prescriptions     Medication Sig Dispense Auth. Provider   tiZANidine (ZANAFLEX) 2 MG tablet Take 1 tablet (2 mg total) by mouth every 8 (eight) hours as needed for muscle spasms. Do not take with alcohol or while driving or operating heavy machinery.  May cause drowsiness. 30 tablet Valentino Nose, NP  I have reviewed the PDMP during this encounter.   Valentino Nose, NP 06/20/23 1100

## 2023-06-20 NOTE — Discharge Instructions (Signed)
We gave you a steroid shot today to help with pain in your left side.  You can take the tizanidine every 8 hours as needed.  Please follow up with PCP if symptoms persist despite treatment.  If pain worsens or you develop numbness/tingling in your foot or are unable to bear weight on your left leg, go to the ER.

## 2023-06-29 DIAGNOSIS — R350 Frequency of micturition: Secondary | ICD-10-CM | POA: Diagnosis not present

## 2023-06-29 DIAGNOSIS — N814 Uterovaginal prolapse, unspecified: Secondary | ICD-10-CM | POA: Diagnosis not present

## 2023-06-29 DIAGNOSIS — B9620 Unspecified Escherichia coli [E. coli] as the cause of diseases classified elsewhere: Secondary | ICD-10-CM | POA: Diagnosis not present

## 2023-06-29 DIAGNOSIS — I10 Essential (primary) hypertension: Secondary | ICD-10-CM | POA: Diagnosis not present

## 2023-06-29 DIAGNOSIS — N390 Urinary tract infection, site not specified: Secondary | ICD-10-CM | POA: Diagnosis not present

## 2023-06-29 DIAGNOSIS — G8929 Other chronic pain: Secondary | ICD-10-CM | POA: Diagnosis not present

## 2023-06-29 DIAGNOSIS — E039 Hypothyroidism, unspecified: Secondary | ICD-10-CM | POA: Diagnosis not present

## 2023-08-15 DIAGNOSIS — N952 Postmenopausal atrophic vaginitis: Secondary | ICD-10-CM | POA: Diagnosis not present

## 2023-08-15 DIAGNOSIS — N8111 Cystocele, midline: Secondary | ICD-10-CM | POA: Diagnosis not present

## 2023-08-17 ENCOUNTER — Telehealth: Payer: Self-pay | Admitting: Family Medicine

## 2023-08-17 ENCOUNTER — Ambulatory Visit: Payer: Medicare HMO | Admitting: Family Medicine

## 2023-08-17 NOTE — Telephone Encounter (Signed)
 NS no reason letter printed

## 2023-08-21 NOTE — Telephone Encounter (Signed)
 1st no show, letter sent via mail

## 2023-09-07 DIAGNOSIS — Z23 Encounter for immunization: Secondary | ICD-10-CM | POA: Diagnosis not present

## 2023-09-07 DIAGNOSIS — I10 Essential (primary) hypertension: Secondary | ICD-10-CM | POA: Diagnosis not present

## 2023-09-07 DIAGNOSIS — R54 Age-related physical debility: Secondary | ICD-10-CM | POA: Diagnosis not present

## 2023-09-07 DIAGNOSIS — G8929 Other chronic pain: Secondary | ICD-10-CM | POA: Diagnosis not present

## 2023-09-07 DIAGNOSIS — N8189 Other female genital prolapse: Secondary | ICD-10-CM | POA: Diagnosis not present

## 2023-09-07 DIAGNOSIS — M48061 Spinal stenosis, lumbar region without neurogenic claudication: Secondary | ICD-10-CM | POA: Diagnosis not present

## 2023-09-07 DIAGNOSIS — R011 Cardiac murmur, unspecified: Secondary | ICD-10-CM | POA: Diagnosis not present

## 2023-10-03 DIAGNOSIS — N8111 Cystocele, midline: Secondary | ICD-10-CM | POA: Diagnosis not present

## 2023-10-16 ENCOUNTER — Ambulatory Visit: Payer: Medicare HMO | Admitting: Neurology

## 2023-10-25 ENCOUNTER — Emergency Department (HOSPITAL_COMMUNITY): Payer: Medicare HMO

## 2023-10-25 ENCOUNTER — Other Ambulatory Visit: Payer: Self-pay

## 2023-10-25 ENCOUNTER — Emergency Department (HOSPITAL_COMMUNITY)
Admission: EM | Admit: 2023-10-25 | Discharge: 2023-10-25 | Disposition: A | Discharge status: Home / Self Care | Payer: Medicare HMO | Attending: Emergency Medicine | Admitting: Emergency Medicine

## 2023-10-25 ENCOUNTER — Encounter (HOSPITAL_COMMUNITY): Payer: Self-pay

## 2023-10-25 DIAGNOSIS — R531 Weakness: Secondary | ICD-10-CM | POA: Insufficient documentation

## 2023-10-25 DIAGNOSIS — I6782 Cerebral ischemia: Secondary | ICD-10-CM | POA: Diagnosis not present

## 2023-10-25 DIAGNOSIS — S199XXA Unspecified injury of neck, initial encounter: Secondary | ICD-10-CM | POA: Diagnosis not present

## 2023-10-25 DIAGNOSIS — M50221 Other cervical disc displacement at C4-C5 level: Secondary | ICD-10-CM | POA: Diagnosis not present

## 2023-10-25 DIAGNOSIS — I10 Essential (primary) hypertension: Secondary | ICD-10-CM | POA: Insufficient documentation

## 2023-10-25 DIAGNOSIS — Z85828 Personal history of other malignant neoplasm of skin: Secondary | ICD-10-CM | POA: Diagnosis not present

## 2023-10-25 DIAGNOSIS — R4182 Altered mental status, unspecified: Secondary | ICD-10-CM | POA: Diagnosis not present

## 2023-10-25 DIAGNOSIS — W01198A Fall on same level from slipping, tripping and stumbling with subsequent striking against other object, initial encounter: Secondary | ICD-10-CM | POA: Insufficient documentation

## 2023-10-25 DIAGNOSIS — E039 Hypothyroidism, unspecified: Secondary | ICD-10-CM | POA: Diagnosis not present

## 2023-10-25 DIAGNOSIS — R519 Headache, unspecified: Secondary | ICD-10-CM | POA: Diagnosis not present

## 2023-10-25 DIAGNOSIS — I959 Hypotension, unspecified: Secondary | ICD-10-CM | POA: Diagnosis not present

## 2023-10-25 DIAGNOSIS — G319 Degenerative disease of nervous system, unspecified: Secondary | ICD-10-CM | POA: Diagnosis not present

## 2023-10-25 DIAGNOSIS — G8929 Other chronic pain: Secondary | ICD-10-CM | POA: Diagnosis not present

## 2023-10-25 DIAGNOSIS — R441 Visual hallucinations: Secondary | ICD-10-CM | POA: Insufficient documentation

## 2023-10-25 DIAGNOSIS — M4802 Spinal stenosis, cervical region: Secondary | ICD-10-CM

## 2023-10-25 DIAGNOSIS — M9971 Connective tissue and disc stenosis of intervertebral foramina of cervical region: Secondary | ICD-10-CM | POA: Diagnosis not present

## 2023-10-25 DIAGNOSIS — R440 Auditory hallucinations: Secondary | ICD-10-CM | POA: Diagnosis not present

## 2023-10-25 DIAGNOSIS — G4489 Other headache syndrome: Secondary | ICD-10-CM | POA: Diagnosis not present

## 2023-10-25 DIAGNOSIS — R442 Other hallucinations: Secondary | ICD-10-CM | POA: Diagnosis not present

## 2023-10-25 DIAGNOSIS — R443 Hallucinations, unspecified: Secondary | ICD-10-CM | POA: Diagnosis not present

## 2023-10-25 DIAGNOSIS — M47812 Spondylosis without myelopathy or radiculopathy, cervical region: Secondary | ICD-10-CM | POA: Diagnosis not present

## 2023-10-25 DIAGNOSIS — R262 Difficulty in walking, not elsewhere classified: Secondary | ICD-10-CM | POA: Diagnosis not present

## 2023-10-25 LAB — CBC WITH DIFFERENTIAL/PLATELET
Abs Immature Granulocytes: 0.07 10*3/uL (ref 0.00–0.07)
Basophils Absolute: 0 10*3/uL (ref 0.0–0.1)
Basophils Relative: 0 %
Eosinophils Absolute: 0.1 10*3/uL (ref 0.0–0.5)
Eosinophils Relative: 1 %
HCT: 35.2 % — ABNORMAL LOW (ref 36.0–46.0)
Hemoglobin: 11.2 g/dL — ABNORMAL LOW (ref 12.0–15.0)
Immature Granulocytes: 1 %
Lymphocytes Relative: 16 %
Lymphs Abs: 1.6 10*3/uL (ref 0.7–4.0)
MCH: 30.6 pg (ref 26.0–34.0)
MCHC: 31.8 g/dL (ref 30.0–36.0)
MCV: 96.2 fL (ref 80.0–100.0)
Monocytes Absolute: 0.5 10*3/uL (ref 0.1–1.0)
Monocytes Relative: 5 %
Neutro Abs: 7.6 10*3/uL (ref 1.7–7.7)
Neutrophils Relative %: 77 %
Platelets: 303 10*3/uL (ref 150–400)
RBC: 3.66 MIL/uL — ABNORMAL LOW (ref 3.87–5.11)
RDW: 16.6 % — ABNORMAL HIGH (ref 11.5–15.5)
WBC: 9.8 10*3/uL (ref 4.0–10.5)
nRBC: 0 % (ref 0.0–0.2)

## 2023-10-25 LAB — COMPREHENSIVE METABOLIC PANEL
ALT: 13 U/L (ref 0–44)
AST: 17 U/L (ref 15–41)
Albumin: 3.9 g/dL (ref 3.5–5.0)
Alkaline Phosphatase: 47 U/L (ref 38–126)
Anion gap: 14 (ref 5–15)
BUN: 38 mg/dL — ABNORMAL HIGH (ref 8–23)
CO2: 17 mmol/L — ABNORMAL LOW (ref 22–32)
Calcium: 8.8 mg/dL — ABNORMAL LOW (ref 8.9–10.3)
Chloride: 106 mmol/L (ref 98–111)
Creatinine, Ser: 1.33 mg/dL — ABNORMAL HIGH (ref 0.44–1.00)
GFR, Estimated: 41 mL/min — ABNORMAL LOW (ref >60)
Glucose, Bld: 103 mg/dL — ABNORMAL HIGH (ref 70–99)
Potassium: 4.6 mmol/L (ref 3.5–5.1)
Sodium: 137 mmol/L (ref 135–145)
Total Bilirubin: 0.5 mg/dL (ref <1.2)
Total Protein: 7.3 g/dL (ref 6.5–8.1)

## 2023-10-25 LAB — AMMONIA: Ammonia: 11 umol/L (ref 9–35)

## 2023-10-25 LAB — RAPID URINE DRUG SCREEN, HOSP PERFORMED
Amphetamines: NOT DETECTED
Barbiturates: NOT DETECTED
Benzodiazepines: NOT DETECTED
Cocaine: NOT DETECTED
Opiates: NOT DETECTED
Tetrahydrocannabinol: NOT DETECTED

## 2023-10-25 LAB — URINALYSIS, ROUTINE W REFLEX MICROSCOPIC
Bilirubin Urine: NEGATIVE
Glucose, UA: NEGATIVE mg/dL
Hgb urine dipstick: NEGATIVE
Ketones, ur: NEGATIVE mg/dL
Nitrite: NEGATIVE
Protein, ur: NEGATIVE mg/dL
Specific Gravity, Urine: 1.012 (ref 1.005–1.030)
pH: 5 (ref 5.0–8.0)

## 2023-10-25 LAB — ETHANOL: Alcohol, Ethyl (B): <10 mg/dL (ref <10)

## 2023-10-25 LAB — TSH: TSH: 2.453 u[IU]/mL (ref 0.350–4.500)

## 2023-10-25 MED ORDER — KETOROLAC TROMETHAMINE 15 MG/ML IJ SOLN
15.0000 mg | Freq: Once | INTRAMUSCULAR | Status: AC
  Administered 2023-10-25: 15 mg via INTRAVENOUS
  Filled 2023-10-25: qty 1

## 2023-10-25 MED ORDER — OXYCODONE-ACETAMINOPHEN 5-325 MG PO TABS: 2.0000 | ORAL_TABLET | Freq: Once | ORAL | Status: DC

## 2023-10-25 MED ORDER — GADOBUTROL 1 MMOL/ML IV SOLN
5.0000 mL | Freq: Once | INTRAVENOUS | Status: AC | PRN
  Administered 2023-10-25: 5 mL via INTRAVENOUS

## 2023-10-25 MED ORDER — PROCHLORPERAZINE EDISYLATE 10 MG/2ML IJ SOLN
10.0000 mg | Freq: Once | INTRAMUSCULAR | Status: AC
  Administered 2023-10-25: 10 mg via INTRAVENOUS
  Filled 2023-10-25: qty 2

## 2023-10-25 MED ORDER — DIPHENHYDRAMINE HCL 50 MG/ML IJ SOLN
12.5000 mg | Freq: Once | INTRAMUSCULAR | Status: AC
  Administered 2023-10-25: 12.5 mg via INTRAVENOUS
  Filled 2023-10-25: qty 1

## 2023-10-25 MED ORDER — ACETAMINOPHEN 500 MG PO TABS
1000.0000 mg | ORAL_TABLET | Freq: Once | ORAL | Status: DC
  Filled 2023-10-25: qty 2

## 2023-10-25 NOTE — ED Notes (Signed)
7:13 PM Report received from previous RN. This RN assumes care of the patient.

## 2023-10-25 NOTE — Care Management (Addendum)
Transition of Care [facility]) - Emergency Department Mini Assessment   Patient Details  Name: Marie Burns MRN: [id number] Date of Birth: 19-Dec-1945  Transition of Care [facility]) CM/SW Contact:    Lavenia Atlas, RN Phone Number: 10/25/2023, 8:21 PM   Clinical Narrative: Received call from EDP who has ordered DME: rolling walker. Due to after hours will coordinate to have RW delivered to patient's home tomorrow. This RNCM spoke with patient and daughter [name] at bedside. Patient is HOH. [name] reports patient has the following DME prior to admission: cane, grab bars. This RNCM offered choice patient and daughter chose any DME vendor and any home health agency who accepts patient's insurance. [name] reports patient lives alone and [name] lives 25 mins away from patient. [name] inquired about private duty nursing advised of out of pocket expense, as insurance does not cover. This RNCM encouraged [name] to call patient's insurance to inquire about additional benefits/ case management offered by insurance.[name] also reports her mother has fallen previously. This RNCM discussed potential for [facility] services. Notified EDP of need for HHPT/OT and SW, orders are placed.   Notified the following HH agencies: Cindie with [facility], who will follow up with patient and daughter tomorrow.   Notified Jermaine with [employer] for DME: RW, awaiting response due to being after hours.  TOC following    ED Mini Assessment: What brought you to the Emergency Department? : Patient reports a fall one week ago with  increased weakness and being unsteady on her feet prior to the fall.  Barriers to Discharge: Continued Medical Work up  [employer]: coordinating DME services  [employer] of departure: Car  Interventions which prevented an admission or readmission: Other (must enter comment) (Coordinating DME: RW)    Patient Contact and Communications        ,          Patient states their goals for this  hospitalization and ongoing recovery are:: return home feeling better CMS Medicare.gov Compare Post Acute Care list provided to:: Patient Choice offered to / list presented to : Patient, Adult Children (Daughter: [name])  Admission diagnosis:  Fall; Hallucinations Patient Active Problem List   Diagnosis Date Noted   S/P reverse total shoulder arthroplasty, right 05/25/2017   PCP:  Mliss Sax, MD Pharmacy:   [facility] - [address]), [place] - [address] [place] [address] Phone: [phone] Fax: [phone]  [id number] [place], [place] - [address] AT [facility] OF [address] [place] [address] Phone: [phone] Fax: [phone]  [place] [facility] [address] [place] [address] Phone: [phone] Fax: [phone]

## 2023-10-25 NOTE — ED Provider Notes (Signed)
  Physical Exam  BP 110/61 (BP Location: Left Arm)   Pulse 77   Temp 98 F (36.7 C) (Oral)   Resp 16   Ht 4\' 10"  (1.473 m)   Wt 52.4 kg   SpO2 99%   BMI 24.14 kg/m   Physical Exam  Procedures  Procedures  ED Course / MDM    Medical Decision Making Care assumed at 4 PM.  Patient is here with unsteadiness and headache.  Patient supposedly had a head injury about a week ago.  Patient lives at home by herself and had a viral illness last week.  Patient also has some visual hallucinations.  Patient states that she knows that they are not real.  Patient is pending MRI brain and cervical spine  7:56 PM I reviewed patient's labs and independently interpreted MRIs.  MRI brain is unremarkable.  MRI of the cervical spine showed mild to moderate spinal stenosis.  Patient is able to ambulate with assistance.  Per the daughter, patient has been declining over the last several months.  She has a neurology appointment and has outpatient MRI scheduled.  I do not think her cervical MRI is clinically significant.  At this point I think she is stable for discharge with neurology follow-up.  Will give her a walker to help her with ambulation.  Problems Addressed: Cervical stenosis of spine: chronic illness or injury with exacerbation, progression, or side effects of treatment Hallucinations: acute illness or injury  Amount and/or Complexity of Data Reviewed Labs: ordered. Decision-making details documented in ED Course. Radiology: ordered and independent interpretation performed. Decision-making details documented in ED Course.  Risk OTC drugs. Prescription drug management.          Charlynne Pander, MD 10/25/23 [phone]

## 2023-10-25 NOTE — ED Provider Notes (Signed)
[place] EMERGENCY DEPARTMENT AT [facility] Provider Note  CSN: [id number] Arrival date & time: 10/25/23 1325  Chief Complaint(s) Fall  HPI Jasminne Corthell is a 77 y.o. female history of hypertension, hypothyroidism presenting to the emergency department with headache.  Patient reports about a week ago she had a fall, hit the back of her head.  She reports since then she has had trouble walking, also when she goes to sleep she hears voices, but cannot make them out.  She also reports visual hallucinations of colors and clouds.  No loss of vision.  No numbness or tingling, weakness.  No fevers or chills.  No neck stiffness.  No chest pain or back pain.  No abdominal pain.  She also reports some generalized weakness.  She lives alone.  She denies any depression, suicidal homicidal ideation, mood changes, or other acute process.   Past Medical History Past Medical History:  Diagnosis Date   Arthritis    right shoulder   Complication of anesthesia    hard to wake up   History of kidney stones    Hypertension    Hypothyroidism    Pneumonia    history   RSD upper limb    left    Skin cancer    chest   Patient Active Problem List   Diagnosis Date Noted   S/P reverse total shoulder arthroplasty, right 05/25/2017   Home Medication(s) Prior to Admission medications   Medication Sig Start Date End Date Taking? Authorizing Provider  acetaminophen (TYLENOL) 325 MG tablet Take 650 mg by mouth every 6 (six) hours as needed for moderate pain.    [provider]  acyclovir (ZOVIRAX) 400 MG tablet Take 2 tablets (800 mg total) by mouth 2 (two) times daily. 01/25/23   Raulkar, Drema Pry, MD  acyclovir ointment (ZOVIRAX) 5 % Apply 1 Application topically every 4 (four) hours. Patient not taking: Reported on 05/16/2023 07/08/22   Horton Chin, MD  albuterol (VENTOLIN HFA) 108 (90 Base) MCG/ACT inhaler  01/06/21   [provider]  amitriptyline (ELAVIL) 25 MG  tablet Take 1 tablet (25 mg total) by mouth at bedtime. 01/09/23   Raulkar, Drema Pry, MD  aspirin-acetaminophen-caffeine (EXCEDRIN MIGRAINE) [phone] MG tablet Take 1 tablet by mouth every 6 (six) hours as needed for headache.    [provider]  Cholecalciferol (VITAMIN D) 50 MCG (2000 UT) CAPS 1 tablet    [provider]  cyanocobalamin (VITAMIN B12) 1000 MCG tablet Take 1 tablet (1,000 mcg total) by mouth daily. 05/19/23   Mliss Sax, MD  diclofenac Sodium (VOLTAREN) 1 % GEL Apply 2 g topically 4 (four) times daily. Patient not taking: Reported on 05/16/2023 07/08/22   Horton Chin, MD  fluticasone (FLONASE) 50 MCG/ACT nasal spray Place 1 spray into both nostrils daily. 07/15/22   [provider]  hydrochlorothiazide (HYDRODIURIL) 25 MG tablet 1 tablet    [provider]  ipratropium (ATROVENT) 0.06 % nasal spray Place into both nostrils. Patient not taking: Reported on 05/16/2023 04/12/22   [provider]  levothyroxine (SYNTHROID) 75 MCG tablet Take 75 mcg by mouth every morning. 06/20/22   [provider]  lisinopril (PRINIVIL,ZESTRIL) 20 MG tablet Take 20 mg by mouth daily at 12 noon. 04/07/17   [provider]  meloxicam (MOBIC) 15 MG tablet Daily prn with food 06/22/22   Raulkar, Drema Pry, MD  Multiple Vitamin (MULTIVITAMIN WITH MINERALS) TABS tablet Take 1 tablet by mouth daily.  [provider]  naloxone [facility]) nasal spray 4 mg/0.1 mL SMARTSIG:1 Both Nares Daily 05/10/22   [provider]  nystatin ointment (MYCOSTATIN)  07/06/20   [provider]  ondansetron (ZOFRAN) 4 MG tablet Take 1 tablet (4 mg total) by mouth every 8 (eight) hours as needed for nausea or vomiting. 10/27/22   Raulkar, Drema Pry, MD  oxyCODONE (ROXICODONE) 15 MG immediate release tablet Take 1 tablet (15 mg total) by mouth 3 (three) times daily as needed for pain. Patient not taking: Reported on 05/16/2023 02/06/23    Horton Chin, MD  SUMAtriptan (IMITREX) 25 MG tablet See admin instructions.    [provider]  SUMAtriptan (IMITREX) 50 MG tablet Take by mouth. 07/15/22   [provider]  tiZANidine (ZANAFLEX) 2 MG tablet Take 1 tablet (2 mg total) by mouth every 8 (eight) hours as needed for muscle spasms. Do not take with alcohol or while driving or operating heavy machinery.  May cause drowsiness. 06/20/23   Valentino Nose, NP  traMADol (ULTRAM) 50 MG tablet Take 50 mg by mouth 3 (three) times daily as needed. 05/12/23   [provider]  valACYclovir (VALTREX) 500 MG tablet  11/11/19   [provider]                                                                                                                                    Past Surgical History Past Surgical History:  Procedure Laterality Date   APPENDECTOMY     BREAST SURGERY Left    lymphnodes removed in the left arm X2   REVERSE SHOULDER ARTHROPLASTY Right 05/25/2017   REVERSE SHOULDER ARTHROPLASTY Right 05/25/2017   Procedure: REVERSE SHOULDER ARTHROPLASTY;  Surgeon: Jones Broom, MD;  Location: [facility] OR;  Service: Orthopedics;  Laterality: Right;  RIGHT REVERSE TOTAL SHOULDER ARTHROPLASTY   Family History History reviewed. No pertinent family history.  Social History Social History   Tobacco Use   Smoking status: Never   Smokeless tobacco: Never  Vaping Use   Vaping status: Never Used  Substance Use Topics   Alcohol use: No   Drug use: No   Allergies Gabapentin, Pregabalin, and Zoster vac recomb adjuvanted  Review of Systems Review of Systems  All other systems reviewed and are negative.   Physical Exam Vital Signs  I have reviewed the triage vital signs BP 94/82 (BP Location: Left Arm)   Pulse 80   Temp 98.2 F (36.8 C) (Oral)   Resp 16   Ht 4\' 10"  (1.473 m)   Wt 52.4 kg   SpO2 100%   BMI 24.14 kg/m  Physical Exam Vitals and nursing note reviewed.  Constitutional:       General: She is not in acute distress.    Appearance: She is well-developed.  HENT:     Head: Normocephalic and atraumatic.     Mouth/Throat:     Mouth: Mucous  membranes are moist.  Eyes:     Extraocular Movements: Extraocular movements intact.     Pupils: Pupils are equal, round, and reactive to light.     Comments: No visual field deficit  Cardiovascular:     Rate and Rhythm: Normal rate and regular rhythm.     Heart sounds: No murmur heard. Pulmonary:     Effort: Pulmonary effort is normal. No respiratory distress.     Breath sounds: Normal breath sounds.  Abdominal:     General: Abdomen is flat.     Palpations: Abdomen is soft.     Tenderness: There is no abdominal tenderness.  Musculoskeletal:        General: No tenderness.     Right lower leg: No edema.     Left lower leg: No edema.  Skin:    General: Skin is warm and dry.  Neurological:     General: No focal deficit present.     Mental Status: She is alert. Mental status is at baseline.     Comments: Cranial nerves II through XII intact, strength 5 out of 5 in the bilateral upper and lower extremities, no sensory deficit to light touch, no dysmetria on finger-nose-finger testing  Psychiatric:        Mood and Affect: Mood normal.        Behavior: Behavior normal.     Comments: Normal train of thought, normal affect, linear, not reacting to internal stimuli     ED Results and Treatments Labs (all labs ordered are listed, but only abnormal results are displayed) Labs Reviewed  COMPREHENSIVE METABOLIC PANEL - Abnormal; Notable for the following components:      Result Value   CO2 17 (*)    Glucose, Bld 103 (*)    BUN 38 (*)    Creatinine, Ser 1.33 (*)    Calcium 8.8 (*)    GFR, Estimated 41 (*)    All other components within normal limits  CBC WITH DIFFERENTIAL/PLATELET - Abnormal; Notable for the following components:   RBC 3.66 (*)    Hemoglobin 11.2 (*)    HCT 35.2 (*)    RDW 16.6 (*)    All other  components within normal limits  URINALYSIS, ROUTINE W REFLEX MICROSCOPIC - Abnormal; Notable for the following components:   Color, Urine STRAW (*)    Leukocytes,Ua SMALL (*)    Bacteria, UA RARE (*)    All other components within normal limits  TSH  AMMONIA  ETHANOL  RAPID URINE DRUG SCREEN, HOSP PERFORMED                                                                                                                          Radiology CT Head Wo Contrast  Result Date: 10/25/2023 CLINICAL DATA:  Mental status change, unknown cause; Neck trauma (Age >= 65y) EXAM: CT HEAD WITHOUT CONTRAST CT CERVICAL SPINE WITHOUT CONTRAST TECHNIQUE: Multidetector CT imaging of the head and cervical spine was performed  following the standard protocol without intravenous contrast. Multiplanar CT image reconstructions of the cervical spine were also generated. RADIATION DOSE REDUCTION: This exam was performed according to the departmental dose-optimization program which includes automated exposure control, adjustment of the mA and/or kV according to patient size and/or use of iterative reconstruction technique. COMPARISON:  None Available. FINDINGS: CT HEAD FINDINGS Brain: No evidence of acute infarction, hemorrhage, hydrocephalus, extra-axial collection or mass lesion/mass effect. There is a background of mild chronic microvascular ischemic change Vascular: No hyperdense vessel or unexpected calcification. Skull: Normal. Negative for fracture or focal lesion. Sinuses/Orbits: No middle ear or mastoid effusion. Paranasal sinuses are clear. Orbits are unremarkable. Other: None. CT CERVICAL SPINE FINDINGS Alignment: Normal. Skull base and vertebrae: No acute fracture. No primary bone lesion or focal pathologic process. Soft tissues and spinal canal: No prevertebral fluid or swelling. No visible canal hematoma. Disc levels:  No CT evidence of high-grade spinal canal stenosis Upper chest: Negative. Other: None IMPRESSION: 1.  No acute intracranial abnormality. 2. No acute fracture or traumatic subluxation of the cervical spine. Electronically Signed   By: Lorenza Cambridge M.D.   On: 10/25/2023 15:38   CT Cervical Spine Wo Contrast  Result Date: 10/25/2023 CLINICAL DATA:  Mental status change, unknown cause; Neck trauma (Age >= 65y) EXAM: CT HEAD WITHOUT CONTRAST CT CERVICAL SPINE WITHOUT CONTRAST TECHNIQUE: Multidetector CT imaging of the head and cervical spine was performed following the standard protocol without intravenous contrast. Multiplanar CT image reconstructions of the cervical spine were also generated. RADIATION DOSE REDUCTION: This exam was performed according to the departmental dose-optimization program which includes automated exposure control, adjustment of the mA and/or kV according to patient size and/or use of iterative reconstruction technique. COMPARISON:  None Available. FINDINGS: CT HEAD FINDINGS Brain: No evidence of acute infarction, hemorrhage, hydrocephalus, extra-axial collection or mass lesion/mass effect. There is a background of mild chronic microvascular ischemic change Vascular: No hyperdense vessel or unexpected calcification. Skull: Normal. Negative for fracture or focal lesion. Sinuses/Orbits: No middle ear or mastoid effusion. Paranasal sinuses are clear. Orbits are unremarkable. Other: None. CT CERVICAL SPINE FINDINGS Alignment: Normal. Skull base and vertebrae: No acute fracture. No primary bone lesion or focal pathologic process. Soft tissues and spinal canal: No prevertebral fluid or swelling. No visible canal hematoma. Disc levels:  No CT evidence of high-grade spinal canal stenosis Upper chest: Negative. Other: None IMPRESSION: 1. No acute intracranial abnormality. 2. No acute fracture or traumatic subluxation of the cervical spine. Electronically Signed   By: Lorenza Cambridge M.D.   On: 10/25/2023 15:38    Pertinent labs & imaging results that were available during my care of the patient  were reviewed by me and considered in my medical decision making (see MDM for details).  Medications Ordered in ED Medications  acetaminophen (TYLENOL) tablet 1,000 mg (1,000 mg Oral Patient Refused/Not Given 10/25/23 1501)  prochlorperazine (COMPAZINE) injection 10 mg (10 mg Intravenous Given 10/25/23 1629)  diphenhydrAMINE (BENADRYL) injection 12.5 mg (12.5 mg Intravenous Given 10/25/23 1629)  ketorolac (TORADOL) 15 MG/ML injection 15 mg (15 mg Intravenous Given 10/25/23 1629)  Procedures Procedures  (including critical care time)  Medical Decision Making / ED Course   MDM:  77 year old presenting with gait abnormalities, headaches, and hallucinations.  Patient overall well-appearing, does not appear psychotic on exam.  Examination is overall reassuring  Unclear cause of her symptoms.  Given fall obtain CT scan which does not show any acute intracranial process or bleeding.  No skull fracture.  Labs without evidence of significant toxic or metabolic abnormality such as thyroid disorder, hyperammonemia, intoxication, electrolyte disturbance.  Patient denies any new medications to suggest cause of her symptoms.  She does not appear psychotic or undergoing an acute psychiatric illness as a cause of her hallucinations.  Doubt CNS infection with no fevers, meningismus.  Obtain MRI to further evaluate.  Signed out to oncoming physician Dr. Silverio Lay pending MRI.      Additional history obtained: -Additional history obtained from ems    Lab Tests: -I ordered, reviewed, and interpreted labs.   The pertinent results include:   Labs Reviewed  COMPREHENSIVE METABOLIC PANEL - Abnormal; Notable for the following components:      Result Value   CO2 17 (*)    Glucose, Bld 103 (*)    BUN 38 (*)    Creatinine, Ser 1.33 (*)    Calcium 8.8 (*)    GFR, Estimated 41  (*)    All other components within normal limits  CBC WITH DIFFERENTIAL/PLATELET - Abnormal; Notable for the following components:   RBC 3.66 (*)    Hemoglobin 11.2 (*)    HCT 35.2 (*)    RDW 16.6 (*)    All other components within normal limits  URINALYSIS, ROUTINE W REFLEX MICROSCOPIC - Abnormal; Notable for the following components:   Color, Urine STRAW (*)    Leukocytes,Ua SMALL (*)    Bacteria, UA RARE (*)    All other components within normal limits  TSH  AMMONIA  ETHANOL  RAPID URINE DRUG SCREEN, HOSP PERFORMED    Mild low hemoglobin, mild high creatinine  Imaging Studies ordered: I ordered imaging studies including CT head On my interpretation imaging demonstrates no acute process I independently visualized and interpreted imaging. I agree with the radiologist interpretation   Medicines ordered and prescription drug management: Meds ordered this encounter  Medications   acetaminophen (TYLENOL) tablet 1,000 mg   prochlorperazine (COMPAZINE) injection 10 mg   diphenhydrAMINE (BENADRYL) injection 12.5 mg   ketorolac (TORADOL) 15 MG/ML injection 15 mg    -I have reviewed the patients home medicines and have made adjustments as needed  Reevaluation: After the interventions noted above, I reevaluated the patient and found that their symptoms have improved  Co morbidities that complicate the patient evaluation  Past Medical History:  Diagnosis Date   Arthritis    right shoulder   Complication of anesthesia    hard to wake up   History of kidney stones    Hypertension    Hypothyroidism    Pneumonia    history   RSD upper limb    left    Skin cancer    chest      Dispostion: Disposition decision including need for hospitalization was considered, and patient disposition pending at time of sign out.    Final Clinical Impression(s) / ED Diagnoses Final diagnoses:  Hallucinations     This chart was dictated using voice recognition software.  Despite  best efforts to proofread,  errors can occur which can change the documentation meaning.    Lonell Grandchild,  MD 10/25/23 1645

## 2023-10-25 NOTE — ED Triage Notes (Addendum)
PT BIBA for fall 1 week ago, landing on her back and hit her head.  PER EMS pt endorses difficulty walking, visual changes, auditory and visual hallucinations.  Pt denies blood thinner use.  Pt is A&O x 4   Pt endorses increasing weakness and being unsteady on her feet prior to the fall.  Stated "I have gotten tangled up in my cane a lot"

## 2023-10-25 NOTE — Discharge Instructions (Addendum)
As discussed, your MRI brain did not show any stroke or mass.  I recommend that you follow-up with your neurologist as scheduled  You have spinal stenosis and can be followed up with your doctor outpatient.  I have contacted the social worker who will be in contact with you regarding sending a walker  If you have worsening hallucinations or another fall, please return immediately to the ER

## 2023-10-26 ENCOUNTER — Telehealth: Payer: Self-pay

## 2023-10-26 NOTE — Telephone Encounter (Signed)
Received call from Cindie with [facility] to confirm HHPT/OT, SW services.  Received message from [place] with [employer] who will follow patient for DME: rolling walker for home delivery.   No additional TOC needs at this time

## 2023-10-31 ENCOUNTER — Ambulatory Visit: Payer: Medicare HMO | Admitting: Neurology

## 2023-11-06 DIAGNOSIS — N8111 Cystocele, midline: Secondary | ICD-10-CM | POA: Diagnosis not present

## 2023-11-06 DIAGNOSIS — N952 Postmenopausal atrophic vaginitis: Secondary | ICD-10-CM | POA: Diagnosis not present

## 2023-11-06 DIAGNOSIS — N8189 Other female genital prolapse: Secondary | ICD-10-CM | POA: Diagnosis not present

## 2023-11-06 DIAGNOSIS — Z4689 Encounter for fitting and adjustment of other specified devices: Secondary | ICD-10-CM | POA: Diagnosis not present

## 2023-12-06 DIAGNOSIS — N898 Other specified noninflammatory disorders of vagina: Secondary | ICD-10-CM | POA: Diagnosis not present

## 2023-12-06 DIAGNOSIS — I10 Essential (primary) hypertension: Secondary | ICD-10-CM | POA: Diagnosis not present

## 2023-12-06 DIAGNOSIS — N958 Other specified menopausal and perimenopausal disorders: Secondary | ICD-10-CM | POA: Diagnosis not present

## 2023-12-06 DIAGNOSIS — T8389XA Other specified complication of genitourinary prosthetic devices, implants and grafts, initial encounter: Secondary | ICD-10-CM | POA: Diagnosis not present

## 2023-12-06 NOTE — Telephone Encounter (Signed)
Late documentation: Patient's daughter called to return call on yesterday, advised we spoke at the bedside. This RNCM initially called then went downstairs to speak with patient and daughter.   No additional TOC needs.

## 2023-12-12 DIAGNOSIS — G8929 Other chronic pain: Secondary | ICD-10-CM | POA: Diagnosis not present

## 2023-12-12 DIAGNOSIS — M51369 Other intervertebral disc degeneration, lumbar region without mention of lumbar back pain or lower extremity pain: Secondary | ICD-10-CM | POA: Diagnosis not present

## 2023-12-12 DIAGNOSIS — M4807 Spinal stenosis, lumbosacral region: Secondary | ICD-10-CM | POA: Diagnosis not present

## 2023-12-12 DIAGNOSIS — I10 Essential (primary) hypertension: Secondary | ICD-10-CM | POA: Diagnosis not present

## 2024-01-05 ENCOUNTER — Other Ambulatory Visit: Payer: Self-pay | Admitting: Physical Medicine and Rehabilitation

## 2024-01-15 DIAGNOSIS — G8929 Other chronic pain: Secondary | ICD-10-CM | POA: Diagnosis not present

## 2024-01-15 DIAGNOSIS — N8189 Other female genital prolapse: Secondary | ICD-10-CM | POA: Diagnosis not present

## 2024-01-15 DIAGNOSIS — I10 Essential (primary) hypertension: Secondary | ICD-10-CM | POA: Diagnosis not present

## 2024-01-31 DIAGNOSIS — N8111 Cystocele, midline: Secondary | ICD-10-CM | POA: Diagnosis not present

## 2024-01-31 DIAGNOSIS — Z4689 Encounter for fitting and adjustment of other specified devices: Secondary | ICD-10-CM | POA: Diagnosis not present

## 2024-02-12 ENCOUNTER — Encounter: Payer: Self-pay | Admitting: Neurology

## 2024-02-12 ENCOUNTER — Ambulatory Visit (INDEPENDENT_AMBULATORY_CARE_PROVIDER_SITE_OTHER): Payer: Medicare HMO | Admitting: Neurology

## 2024-02-12 VITALS — BP 167/81 | HR 71 | Ht 60.0 in | Wt 115.5 lb

## 2024-02-12 DIAGNOSIS — M545 Low back pain, unspecified: Secondary | ICD-10-CM | POA: Diagnosis not present

## 2024-02-12 DIAGNOSIS — G8929 Other chronic pain: Secondary | ICD-10-CM | POA: Diagnosis not present

## 2024-02-12 DIAGNOSIS — R4189 Other symptoms and signs involving cognitive functions and awareness: Secondary | ICD-10-CM | POA: Diagnosis not present

## 2024-02-12 NOTE — Progress Notes (Signed)
 Chief Complaint  Patient presents with   New Patient (Initial Visit)    Rm15, alone,  NP Internal referral for changes in cognition, fam hx of dementia 22/30 is mmse score.       ASSESSMENT AND PLAN  Marie Burns is a 78 y.o. female   Cognitive impairment Chronic low back pain, gait abnormality  Mini Mental status examination 22/30  MRI of the brain showed mild age-related generalized atrophy mild small vessel disease,  MRI of cervical spine showed multilevel degenerative changes, no significant canal stenosis, variable degree of foraminal narrowing,  MRI lumbar August 2023 moderately severe central canal stenosis at L3-4,  MRI lumbar findings would explain her significant low back pain, lower extremity pain  Laboratory evaluation since December 2024 showed normal TSH, CMP showed mild elevated creatinine 1.33, which is worse than her baseline, mild anemia hemoglobin of 11.2,  I would suggest B12, RPR to complete evaluation for patient complains of cognitive impairment,  Chronic migraine headache  She is using frequent Imitrex, with her age, vascular risk factor, hypertension, small vessel disease on brain, I caution her vascular constriction side effect of triptan, may consider higher dose of preventive medication, CGRP antagonist as needed  She is very much against continue follow-up with neurology clinic, will continue care with her primary care physician,  DIAGNOSTIC DATA (LABS, IMAGING, TESTING) - I reviewed patient records, labs, notes, testing and imaging myself where available.   MEDICAL HISTORY:  Marie Burns is a 78 year old female, seen in request by her primary care from [facility] nurse practitioner Camie Patience, FNP for evaluation of memory loss,  History is obtained from the patient and review of electronic medical records. I personally reviewed pertinent available imaging films in PACS.   PMHx of  HTN Hypothryodism Chronic migraine.  Patient is very  agitated at today's clinical visit, canceled 5 appointment with [facility] in 2024, complains that it is her daughter urged her to see a neurologist, for mild memory loss  She complains of chronic low back pain, radiating pain to bilateral lower extremity, with history of lumbar stenosis, is a surgical candidate, but she has doubt about surgical procedure, complains of 10 out of 10 lower extremity pain today, taking tramadol 3 times a day,  She did aware of mild memory loss, difficulty finding her ways, has difficulty operating her GPS, Mini-Mental Status Examination 22/30 today  She spent most of the time complains about her daughter dictate her medical care,  She has chronic migraine headache, taking Imitrex as needed  Reviewed MRI of the brain with without contrast December 2024, generalized atrophy, mild small vessel disease  PHYSICAL EXAM:   Vitals:   02/12/24 1501 02/12/24 1521  BP: (!) 152/77 (!) 167/81  Pulse: 71   Weight: 115 lb 8 oz (52.4 kg)   Height: 5' (1.524 m)    Not recorded     Body mass index is 22.56 kg/m.  PHYSICAL EXAMNIATION:  Gen: NAD, conversant, well nourised, well groomed                     Cardiovascular: Regular rate rhythm, no peripheral edema, warm, nontender. Eyes: Conjunctivae clear without exudates or hemorrhage Neck: Supple, no carotid bruits. Pulmonary: Clear to auscultation bilaterally   NEUROLOGICAL EXAM:  MENTAL STATUS: Speech/cognition: Agitated, anxious elderly female    02/12/2024    3:11 PM  MMSE - Mini Mental State Exam  Orientation to time 4  Orientation to Place 4  Registration  3  Attention/ Calculation 1  Recall 2  Language- name 2 objects 2  Language- repeat 1  Language- follow 3 step command 3  Language- read & follow direction 1  Write a sentence 1  Copy design 0  Total score 22    CRANIAL NERVES: CN II: Visual fields are full to confrontation. Pupils are round equal and briskly reactive to light. CN III, IV,  VI: extraocular movement are normal. No ptosis. CN V: Facial sensation is intact to light touch CN VII: Face is symmetric with normal eye closure  CN VIII: Hearing is normal to causal conversation. CN IX, X: Phonation is normal. CN XI: Head turning and shoulder shrug are intact  MOTOR: There is no pronator drift of out-stretched arms. Muscle bulk and tone are normal. Muscle strength is normal.  REFLEXES: Reflexes are 1 and symmetric at the biceps, triceps, knees, and ankles. Plantar responses are flexor.  SENSORY: Intact to light touch, pinprick and vibratory sensation are intact in fingers and toes.  COORDINATION: There is no trunk or limb dysmetria noted.  GAIT/STANCE: Push-up to get up from seated position, mildly antalgic  REVIEW OF SYSTEMS:  Full 14 system review of systems performed and notable only for as above All other review of systems were negative.   ALLERGIES: Allergies  Allergen Reactions   Duloxetine     Other Reaction(s): headache and nausea   Gabapentin Other (See Comments)    UNSPECIFIED "URINARY ISSUES" Other reaction(s): rash, itching, orange/foul urine Other reaction(s): rash, itching, orange/foul urine   Pregabalin     Other reaction(s): rash, itching, orange/foul urine Other reaction(s): rash, itching, orange/foul urine   Zoster Vac Recomb Adjuvanted     Other reaction(s): arm blisters Other reaction(s): arm blisters   Voltaren [Diclofenac] Rash    Skin rash     HOME MEDICATIONS: Current Outpatient Medications  Medication Sig Dispense Refill   acetaminophen (TYLENOL) 325 MG tablet Take 650 mg by mouth every 6 (six) hours as needed for moderate pain.     acyclovir (ZOVIRAX) 400 MG tablet Take 2 tablets (800 mg total) by mouth 2 (two) times daily. 14 tablet 3   Cholecalciferol (VITAMIN D) 50 MCG (2000 UT) CAPS 1 tablet     estradiol (ESTRACE) 0.1 MG/GM vaginal cream Place 1 Applicatorful vaginally.     hydrochlorothiazide (HYDRODIURIL) 25 MG  tablet 1 tablet     ipratropium (ATROVENT) 0.06 % nasal spray Place into both nostrils.     levothyroxine (SYNTHROID) 75 MCG tablet Take 75 mcg by mouth every morning.     lisinopril (PRINIVIL,ZESTRIL) 20 MG tablet Take 20 mg by mouth daily at 12 noon.     methocarbamol (ROBAXIN) 500 MG tablet Take 500 mg by mouth 3 (three) times daily as needed.     naloxone (NARCAN) nasal spray 4 mg/0.1 mL SMARTSIG:1 Both Nares Daily     nystatin ointment (MYCOSTATIN)      oxyCODONE (ROXICODONE) 15 MG immediate release tablet Take 1 tablet (15 mg total) by mouth 3 (three) times daily as needed for pain. 90 tablet 0   SUMAtriptan (IMITREX) 50 MG tablet Take by mouth.     tiZANidine (ZANAFLEX) 2 MG tablet Take 1 tablet (2 mg total) by mouth every 8 (eight) hours as needed for muscle spasms. Do not take with alcohol or while driving or operating heavy machinery.  May cause drowsiness. 30 tablet 0   traMADol (ULTRAM) 50 MG tablet Take 50 mg by mouth 3 (three) times daily as  needed.     valACYclovir (VALTREX) 500 MG tablet      amitriptyline (ELAVIL) 25 MG tablet Take 1 tablet (25 mg total) by mouth at bedtime. 30 tablet 3   No current facility-administered medications for this visit.    PAST MEDICAL HISTORY: Past Medical History:  Diagnosis Date   Arthritis    right shoulder   Complication of anesthesia    hard to wake up   History of kidney stones    Hypertension    Hypothyroidism    Pneumonia    history   RSD upper limb    left    Skin cancer    chest    PAST SURGICAL HISTORY: Past Surgical History:  Procedure Laterality Date   APPENDECTOMY     BREAST SURGERY Left    lymphnodes removed in the left arm X2   REVERSE SHOULDER ARTHROPLASTY Right 05/25/2017   REVERSE SHOULDER ARTHROPLASTY Right 05/25/2017   Procedure: REVERSE SHOULDER ARTHROPLASTY;  Surgeon: Jones Broom, MD;  Location: [facility] OR;  Service: Orthopedics;  Laterality: Right;  RIGHT REVERSE TOTAL SHOULDER ARTHROPLASTY    FAMILY  HISTORY: History reviewed. No pertinent family history.  SOCIAL HISTORY: Social History   Socioeconomic History   Marital status: Divorced    Spouse name: Not on file   Number of children: Not on file   Years of education: Not on file   Highest education level: Not on file  Occupational History   Not on file  Tobacco Use   Smoking status: Never   Smokeless tobacco: Never  Vaping Use   Vaping status: Never Used  Substance and Sexual Activity   Alcohol use: No   Drug use: No   Sexual activity: Not on file  Other Topics Concern   Not on file  Social History Narrative   Not on file   Social Drivers of Health   Financial Resource Strain: Not on file  Food Insecurity: Not on file  Transportation Needs: Not on file  Physical Activity: Not on file  Stress: Not on file  Social Connections: Not on file  Intimate Partner Violence: Not on file      Levert Feinstein, M.D. Ph.D.  [facility] [address], [address] [place], [place] [address] Ph: [phone] Fax: [phone]  CC:  Horton Chin, MD [address] [place],  [place] [address]  Mliss Sax, MD

## 2024-02-21 ENCOUNTER — Telehealth: Payer: Self-pay

## 2024-02-21 NOTE — Telephone Encounter (Signed)
 Late Note: Patient called in and wanted to "start over". Stated that she was in pain due to missing medication during her appointment on 02/12/24. She states that she did not want to be at the appointment and would like to wipe her slate clean and start over with a whole new appointment. I was able to get her in for an appointment on 04/25/24 with Dr Terrace Arabia and offered to have her come in sooner for lab work, which was what Dr Terrace Arabia had requested the day of her appointment. She stated she would wait until June to complete the blood work.  Marie Burns called in again today to again apologize for her behavior during her first appointment. She confirmed her appointment for June.

## 2024-02-23 DIAGNOSIS — R350 Frequency of micturition: Secondary | ICD-10-CM | POA: Diagnosis not present

## 2024-02-23 DIAGNOSIS — N898 Other specified noninflammatory disorders of vagina: Secondary | ICD-10-CM | POA: Diagnosis not present

## 2024-02-23 DIAGNOSIS — Z4689 Encounter for fitting and adjustment of other specified devices: Secondary | ICD-10-CM | POA: Diagnosis not present

## 2024-03-18 DIAGNOSIS — I10 Essential (primary) hypertension: Secondary | ICD-10-CM | POA: Diagnosis not present

## 2024-03-18 DIAGNOSIS — Z0000 Encounter for general adult medical examination without abnormal findings: Secondary | ICD-10-CM | POA: Diagnosis not present

## 2024-03-18 DIAGNOSIS — R7303 Prediabetes: Secondary | ICD-10-CM | POA: Diagnosis not present

## 2024-03-18 DIAGNOSIS — E039 Hypothyroidism, unspecified: Secondary | ICD-10-CM | POA: Diagnosis not present

## 2024-03-18 DIAGNOSIS — E559 Vitamin D deficiency, unspecified: Secondary | ICD-10-CM | POA: Diagnosis not present

## 2024-03-18 DIAGNOSIS — E785 Hyperlipidemia, unspecified: Secondary | ICD-10-CM | POA: Diagnosis not present

## 2024-03-18 DIAGNOSIS — R739 Hyperglycemia, unspecified: Secondary | ICD-10-CM | POA: Diagnosis not present

## 2024-03-18 DIAGNOSIS — Z1331 Encounter for screening for depression: Secondary | ICD-10-CM | POA: Diagnosis not present

## 2024-03-18 DIAGNOSIS — E538 Deficiency of other specified B group vitamins: Secondary | ICD-10-CM | POA: Diagnosis not present

## 2024-04-25 ENCOUNTER — Ambulatory Visit: Admitting: Neurology

## 2024-05-06 ENCOUNTER — Encounter: Payer: Self-pay | Admitting: Neurology

## 2024-05-06 ENCOUNTER — Telehealth: Payer: Self-pay | Admitting: Neurology

## 2024-05-06 NOTE — Telephone Encounter (Signed)
 LVM and sent letter in mail informing pt of need to reschedule 08/22/24 appt - MD out

## 2024-05-07 NOTE — Telephone Encounter (Signed)
 Pt has r/s her appointment

## 2024-05-14 DIAGNOSIS — M48061 Spinal stenosis, lumbar region without neurogenic claudication: Secondary | ICD-10-CM | POA: Diagnosis not present

## 2024-05-14 DIAGNOSIS — E785 Hyperlipidemia, unspecified: Secondary | ICD-10-CM | POA: Diagnosis not present

## 2024-05-14 DIAGNOSIS — G8929 Other chronic pain: Secondary | ICD-10-CM | POA: Diagnosis not present

## 2024-05-22 ENCOUNTER — Telehealth: Payer: Self-pay | Admitting: Neurology

## 2024-05-22 NOTE — Telephone Encounter (Signed)
 Appointment details confirmed

## 2024-05-23 DIAGNOSIS — Z4689 Encounter for fitting and adjustment of other specified devices: Secondary | ICD-10-CM | POA: Diagnosis not present

## 2024-05-23 DIAGNOSIS — N952 Postmenopausal atrophic vaginitis: Secondary | ICD-10-CM | POA: Diagnosis not present

## 2024-05-23 DIAGNOSIS — N8111 Cystocele, midline: Secondary | ICD-10-CM | POA: Diagnosis not present

## 2024-07-22 ENCOUNTER — Emergency Department (HOSPITAL_COMMUNITY)
Admission: EM | Admit: 2024-07-22 | Discharge: 2024-07-23 | Discharge status: Against Medical Advice | Attending: Emergency Medicine | Admitting: Emergency Medicine

## 2024-07-22 ENCOUNTER — Emergency Department (HOSPITAL_COMMUNITY)

## 2024-07-22 ENCOUNTER — Other Ambulatory Visit: Payer: Self-pay

## 2024-07-22 ENCOUNTER — Encounter (HOSPITAL_COMMUNITY): Payer: Self-pay

## 2024-07-22 DIAGNOSIS — M19041 Primary osteoarthritis, right hand: Secondary | ICD-10-CM | POA: Diagnosis not present

## 2024-07-22 DIAGNOSIS — Z5321 Procedure and treatment not carried out due to patient leaving prior to being seen by health care provider: Secondary | ICD-10-CM | POA: Diagnosis not present

## 2024-07-22 DIAGNOSIS — M85831 Other specified disorders of bone density and structure, right forearm: Secondary | ICD-10-CM | POA: Diagnosis not present

## 2024-07-22 DIAGNOSIS — M79631 Pain in right forearm: Secondary | ICD-10-CM | POA: Diagnosis not present

## 2024-07-22 DIAGNOSIS — M25511 Pain in right shoulder: Secondary | ICD-10-CM | POA: Diagnosis not present

## 2024-07-22 DIAGNOSIS — M25531 Pain in right wrist: Secondary | ICD-10-CM | POA: Diagnosis not present

## 2024-07-22 DIAGNOSIS — I700 Atherosclerosis of aorta: Secondary | ICD-10-CM | POA: Diagnosis not present

## 2024-07-22 DIAGNOSIS — W19XXXA Unspecified fall, initial encounter: Secondary | ICD-10-CM | POA: Diagnosis not present

## 2024-07-22 DIAGNOSIS — Z96611 Presence of right artificial shoulder joint: Secondary | ICD-10-CM | POA: Diagnosis not present

## 2024-07-22 DIAGNOSIS — M79641 Pain in right hand: Secondary | ICD-10-CM | POA: Diagnosis not present

## 2024-07-22 DIAGNOSIS — M85811 Other specified disorders of bone density and structure, right shoulder: Secondary | ICD-10-CM | POA: Diagnosis not present

## 2024-07-22 DIAGNOSIS — M85841 Other specified disorders of bone density and structure, right hand: Secondary | ICD-10-CM | POA: Diagnosis not present

## 2024-07-22 MED ORDER — OXYCODONE-ACETAMINOPHEN 5-325 MG PO TABS
1.0000 | ORAL_TABLET | ORAL | Status: DC | PRN
  Administered 2024-07-22: 1 via ORAL
  Filled 2024-07-22: qty 1

## 2024-07-22 NOTE — ED Triage Notes (Signed)
 Pt had a fall on Thursday, pt c.o right hand/wrist pain.

## 2024-08-21 DIAGNOSIS — R634 Abnormal weight loss: Secondary | ICD-10-CM | POA: Diagnosis not present

## 2024-08-21 DIAGNOSIS — Z23 Encounter for immunization: Secondary | ICD-10-CM | POA: Diagnosis not present

## 2024-08-21 DIAGNOSIS — M1990 Unspecified osteoarthritis, unspecified site: Secondary | ICD-10-CM | POA: Diagnosis not present

## 2024-08-21 DIAGNOSIS — G8929 Other chronic pain: Secondary | ICD-10-CM | POA: Diagnosis not present

## 2024-08-21 DIAGNOSIS — E039 Hypothyroidism, unspecified: Secondary | ICD-10-CM | POA: Diagnosis not present

## 2024-08-21 DIAGNOSIS — I10 Essential (primary) hypertension: Secondary | ICD-10-CM | POA: Diagnosis not present

## 2024-08-21 DIAGNOSIS — M48061 Spinal stenosis, lumbar region without neurogenic claudication: Secondary | ICD-10-CM | POA: Diagnosis not present

## 2024-08-21 DIAGNOSIS — G9050 Complex regional pain syndrome I, unspecified: Secondary | ICD-10-CM | POA: Diagnosis not present

## 2024-08-21 DIAGNOSIS — R7303 Prediabetes: Secondary | ICD-10-CM | POA: Diagnosis not present

## 2024-08-22 ENCOUNTER — Ambulatory Visit: Admitting: Neurology

## 2024-09-05 DIAGNOSIS — R350 Frequency of micturition: Secondary | ICD-10-CM | POA: Diagnosis not present

## 2024-09-05 DIAGNOSIS — N952 Postmenopausal atrophic vaginitis: Secondary | ICD-10-CM | POA: Diagnosis not present

## 2024-09-05 DIAGNOSIS — Z4689 Encounter for fitting and adjustment of other specified devices: Secondary | ICD-10-CM | POA: Diagnosis not present

## 2024-09-05 DIAGNOSIS — R32 Unspecified urinary incontinence: Secondary | ICD-10-CM | POA: Diagnosis not present

## 2024-09-11 ENCOUNTER — Telehealth: Payer: Self-pay | Admitting: Neurology

## 2024-09-11 NOTE — Telephone Encounter (Signed)
 Appointment details confirmed

## 2024-09-24 ENCOUNTER — Ambulatory Visit: Admitting: Neurology

## 2025-02-04 ENCOUNTER — Ambulatory Visit: Admitting: Neurology
